# Patient Record
Sex: Female | Born: 1955 | Race: White | Hispanic: No | Marital: Single | Smoking: Never smoker
Health system: Southern US, Community
[De-identification: ages and names within clinical notes are randomized; demographics above are authoritative.]

## PROBLEM LIST (undated history)

## (undated) DIAGNOSIS — E039 Hypothyroidism, unspecified: Secondary | ICD-10-CM

## (undated) DIAGNOSIS — G43909 Migraine, unspecified, not intractable, without status migrainosus: Secondary | ICD-10-CM

## (undated) DIAGNOSIS — Z8619 Personal history of other infectious and parasitic diseases: Secondary | ICD-10-CM

## (undated) DIAGNOSIS — D649 Anemia, unspecified: Secondary | ICD-10-CM

## (undated) DIAGNOSIS — R011 Cardiac murmur, unspecified: Secondary | ICD-10-CM

## (undated) HISTORY — DX: Cardiac murmur, unspecified: R01.1

## (undated) HISTORY — DX: Personal history of other infectious and parasitic diseases: Z86.19

## (undated) HISTORY — PX: CATARACT EXTRACTION: SUR2

## (undated) HISTORY — DX: Migraine, unspecified, not intractable, without status migrainosus: G43.909

## (undated) HISTORY — PX: COLONOSCOPY: SHX174

## (undated) HISTORY — PX: OTHER SURGICAL HISTORY: SHX169

## (undated) HISTORY — PX: WISDOM TOOTH EXTRACTION: SHX21

## (undated) HISTORY — PX: ABLATION: SHX5711

---

## 1998-05-15 ENCOUNTER — Other Ambulatory Visit: Admission: RE | Admit: 1998-05-15 | Discharge: 1998-05-15 | Payer: Self-pay | Admitting: Obstetrics and Gynecology

## 1998-05-21 ENCOUNTER — Ambulatory Visit (HOSPITAL_COMMUNITY): Admission: RE | Admit: 1998-05-21 | Discharge: 1998-05-21 | Payer: Self-pay | Admitting: Obstetrics and Gynecology

## 2000-02-20 ENCOUNTER — Other Ambulatory Visit: Admission: RE | Admit: 2000-02-20 | Discharge: 2000-02-20 | Payer: Self-pay | Admitting: Obstetrics and Gynecology

## 2002-06-01 ENCOUNTER — Other Ambulatory Visit: Admission: RE | Admit: 2002-06-01 | Discharge: 2002-06-01 | Payer: Self-pay | Admitting: Obstetrics and Gynecology

## 2003-06-06 ENCOUNTER — Other Ambulatory Visit: Admission: RE | Admit: 2003-06-06 | Discharge: 2003-06-06 | Payer: Self-pay | Admitting: Obstetrics and Gynecology

## 2003-06-14 ENCOUNTER — Ambulatory Visit (HOSPITAL_COMMUNITY): Admission: RE | Admit: 2003-06-14 | Discharge: 2003-06-14 | Payer: Self-pay | Admitting: Obstetrics and Gynecology

## 2004-10-07 ENCOUNTER — Other Ambulatory Visit: Admission: RE | Admit: 2004-10-07 | Discharge: 2004-10-07 | Payer: Self-pay | Admitting: Obstetrics and Gynecology

## 2004-11-08 ENCOUNTER — Ambulatory Visit: Payer: Self-pay | Admitting: Family Medicine

## 2004-12-06 ENCOUNTER — Ambulatory Visit: Payer: Self-pay | Admitting: Internal Medicine

## 2004-12-20 ENCOUNTER — Ambulatory Visit: Payer: Self-pay | Admitting: Internal Medicine

## 2007-06-22 DIAGNOSIS — D649 Anemia, unspecified: Secondary | ICD-10-CM | POA: Insufficient documentation

## 2010-11-27 ENCOUNTER — Other Ambulatory Visit: Payer: Self-pay | Admitting: Obstetrics and Gynecology

## 2011-01-09 ENCOUNTER — Other Ambulatory Visit: Payer: Self-pay | Admitting: Obstetrics and Gynecology

## 2011-01-09 DIAGNOSIS — D580 Hereditary spherocytosis: Secondary | ICD-10-CM

## 2011-02-24 ENCOUNTER — Ambulatory Visit
Admission: RE | Admit: 2011-02-24 | Discharge: 2011-02-24 | Disposition: A | Discharge status: Home / Self Care | Payer: PRIVATE HEALTH INSURANCE | Source: Ambulatory Visit | Attending: Obstetrics and Gynecology | Admitting: Obstetrics and Gynecology

## 2011-02-24 DIAGNOSIS — D580 Hereditary spherocytosis: Secondary | ICD-10-CM

## 2011-04-29 ENCOUNTER — Encounter (HOSPITAL_COMMUNITY): Payer: Self-pay | Admitting: *Deleted

## 2011-05-08 ENCOUNTER — Encounter (HOSPITAL_COMMUNITY): Payer: Self-pay | Admitting: *Deleted

## 2011-05-08 ENCOUNTER — Encounter (HOSPITAL_COMMUNITY)
Admission: RE | Disposition: A | Discharge status: Home / Self Care | Payer: Self-pay | Source: Ambulatory Visit | Attending: Obstetrics and Gynecology

## 2011-05-08 ENCOUNTER — Ambulatory Visit (HOSPITAL_COMMUNITY): Payer: PRIVATE HEALTH INSURANCE | Admitting: Anesthesiology

## 2011-05-08 ENCOUNTER — Encounter (HOSPITAL_COMMUNITY): Payer: Self-pay | Admitting: Anesthesiology

## 2011-05-08 ENCOUNTER — Ambulatory Visit (HOSPITAL_COMMUNITY)
Admission: RE | Admit: 2011-05-08 | Discharge: 2011-05-08 | Disposition: A | Discharge status: Home / Self Care | Payer: PRIVATE HEALTH INSURANCE | Source: Ambulatory Visit | Attending: Obstetrics and Gynecology | Admitting: Obstetrics and Gynecology

## 2011-05-08 ENCOUNTER — Other Ambulatory Visit: Payer: Self-pay | Admitting: Obstetrics and Gynecology

## 2011-05-08 DIAGNOSIS — D250 Submucous leiomyoma of uterus: Secondary | ICD-10-CM | POA: Insufficient documentation

## 2011-05-08 DIAGNOSIS — N840 Polyp of corpus uteri: Secondary | ICD-10-CM | POA: Insufficient documentation

## 2011-05-08 DIAGNOSIS — N920 Excessive and frequent menstruation with regular cycle: Secondary | ICD-10-CM | POA: Insufficient documentation

## 2011-05-08 DIAGNOSIS — D649 Anemia, unspecified: Secondary | ICD-10-CM

## 2011-05-08 HISTORY — PX: HYSTEROSCOPY: SHX211

## 2011-05-08 HISTORY — DX: Anemia, unspecified: D64.9

## 2011-05-08 HISTORY — DX: Hypothyroidism, unspecified: E03.9

## 2011-05-08 LAB — PREGNANCY, URINE: Preg Test, Ur: NEGATIVE

## 2011-05-08 LAB — CBC
MCH: 28.2 pg (ref 26.0–34.0)
MCHC: 31.4 g/dL (ref 30.0–36.0)
MCV: 89.8 fL (ref 78.0–100.0)
Platelets: 317 10*3/uL (ref 150–400)

## 2011-05-08 SURGERY — ABLATION, ENDOMETRIUM, HYSTEROSCOPIC
Wound class: Clean Contaminated

## 2011-05-08 MED ORDER — ONDANSETRON HCL 4 MG/2ML IJ SOLN
INTRAMUSCULAR | Status: AC
  Filled 2011-05-08: qty 2

## 2011-05-08 MED ORDER — MEPERIDINE HCL 25 MG/ML IJ SOLN: 6.2500 mg | INTRAMUSCULAR | Status: DC | PRN

## 2011-05-08 MED ORDER — PROPOFOL 10 MG/ML IV EMUL
INTRAVENOUS | Status: AC
  Filled 2011-05-08: qty 20

## 2011-05-08 MED ORDER — FENTANYL CITRATE 0.05 MG/ML IJ SOLN
INTRAMUSCULAR | Status: DC | PRN
  Administered 2011-05-08: 100 ug via INTRAVENOUS

## 2011-05-08 MED ORDER — DEXAMETHASONE SODIUM PHOSPHATE 4 MG/ML IJ SOLN
INTRAMUSCULAR | Status: DC | PRN
  Administered 2011-05-08: 10 mg via INTRAVENOUS

## 2011-05-08 MED ORDER — FENTANYL CITRATE 0.05 MG/ML IJ SOLN
INTRAMUSCULAR | Status: AC
  Filled 2011-05-08: qty 2

## 2011-05-08 MED ORDER — LACTATED RINGERS IV SOLN
INTRAVENOUS | Status: DC
  Administered 2011-05-08: 125 mL/h via INTRAVENOUS

## 2011-05-08 MED ORDER — LIDOCAINE HCL (CARDIAC) 20 MG/ML IV SOLN
INTRAVENOUS | Status: AC
  Filled 2011-05-08: qty 5

## 2011-05-08 MED ORDER — ONDANSETRON HCL 4 MG/2ML IJ SOLN
INTRAMUSCULAR | Status: DC | PRN
  Administered 2011-05-08: 4 mg via INTRAVENOUS

## 2011-05-08 MED ORDER — FENTANYL CITRATE 0.05 MG/ML IJ SOLN: 25.0000 ug | INTRAMUSCULAR | Status: DC | PRN

## 2011-05-08 MED ORDER — CEFAZOLIN SODIUM 1-5 GM-% IV SOLN
INTRAVENOUS | Status: DC | PRN
  Administered 2011-05-08: 1 g via INTRAVENOUS

## 2011-05-08 MED ORDER — KETOROLAC TROMETHAMINE 30 MG/ML IJ SOLN
INTRAMUSCULAR | Status: AC
  Filled 2011-05-08: qty 1

## 2011-05-08 MED ORDER — LIDOCAINE HCL (CARDIAC) 20 MG/ML IV SOLN
INTRAVENOUS | Status: DC | PRN
  Administered 2011-05-08: 100 mg via INTRAVENOUS

## 2011-05-08 MED ORDER — LACTATED RINGERS IV SOLN
INTRAVENOUS | Status: DC
  Administered 2011-05-08: 1000 mL via INTRAVENOUS
  Administered 2011-05-08: 12:00:00 via INTRAVENOUS

## 2011-05-08 MED ORDER — OXYCODONE-ACETAMINOPHEN 5-325 MG PO TABS: 1.0000 | ORAL_TABLET | ORAL | Status: AC | PRN

## 2011-05-08 MED ORDER — MIDAZOLAM HCL 2 MG/2ML IJ SOLN
INTRAMUSCULAR | Status: AC
  Filled 2011-05-08: qty 2

## 2011-05-08 MED ORDER — IBUPROFEN 200 MG PO TABS: 200.0000 mg | ORAL_TABLET | Freq: Four times a day (QID) | ORAL | Status: DC | PRN

## 2011-05-08 MED ORDER — ONDANSETRON HCL 4 MG/2ML IJ SOLN: 4.0000 mg | Freq: Once | INTRAMUSCULAR | Status: DC | PRN

## 2011-05-08 MED ORDER — DEXAMETHASONE SODIUM PHOSPHATE 10 MG/ML IJ SOLN
INTRAMUSCULAR | Status: AC
  Filled 2011-05-08: qty 1

## 2011-05-08 MED ORDER — CEFAZOLIN SODIUM 1-5 GM-% IV SOLN
INTRAVENOUS | Status: AC
  Filled 2011-05-08: qty 50

## 2011-05-08 MED ORDER — MIDAZOLAM HCL 5 MG/5ML IJ SOLN
INTRAMUSCULAR | Status: DC | PRN
  Administered 2011-05-08: 2 mg via INTRAVENOUS

## 2011-05-08 MED ORDER — LIDOCAINE HCL 1 % IJ SOLN
INTRAMUSCULAR | Status: DC | PRN
  Administered 2011-05-08 (×2): 10 mL via INTRADERMAL

## 2011-05-08 SURGICAL SUPPLY — 13 items
ABLATOR ENDOMETRIAL BIPOLAR (ABLATOR) IMPLANT
CATH ROBINSON RED A/P 16FR (CATHETERS) ×2 IMPLANT
CLOTH BEACON ORANGE TIMEOUT ST (SAFETY) ×2 IMPLANT
CONTAINER PREFILL 10% NBF 60ML (FORM) IMPLANT
GLOVE ECLIPSE 7.0 STRL STRAW (GLOVE) ×4 IMPLANT
GOWN PREVENTION PLUS LG XLONG (DISPOSABLE) ×2 IMPLANT
GOWN PREVENTION PLUS XLARGE (GOWN DISPOSABLE) ×2 IMPLANT
NEEDLE SPNL 22GX3.5 QUINCKE BK (NEEDLE) ×2 IMPLANT
PACK HYSTEROSCOPY LF (CUSTOM PROCEDURE TRAY) ×2 IMPLANT
SET GENESYS HTA PROCERVA (MISCELLANEOUS) ×2 IMPLANT
SYR CONTROL 10ML LL (SYRINGE) ×2 IMPLANT
TOWEL OR 17X24 6PK STRL BLUE (TOWEL DISPOSABLE) ×4 IMPLANT
WATER STERILE IRR 1000ML POUR (IV SOLUTION) ×2 IMPLANT

## 2011-05-08 NOTE — Progress Notes (Signed)
Two vaginal sponges removed prior to discharge home pt tolerated well.

## 2011-05-08 NOTE — Discharge Instructions (Signed)
Endometrial Ablation HOME CARE INSTRUCTIONS  Do not drive for 24 hours.   No tampons, douching or intercourse for 2 weeks or until your caregiver approves.   Rest at home for 24 to 48 hours. You may then resume normal activities unless told differently by your caregiver.   Take your temperature two times a day for 4 days, and record it.   Take any medications your caregiver has ordered, as directed.   Use some form of contraception if you are pre-menopausal and do not want to get pregnant.  Bleeding after the procedure is normal. It varies from light spotting and mildly watery to bloody discharge for 4 to 6 weeks. You may also have mild cramping. Only take over-the-counter or prescription medicines for pain, discomfort, or fever as directed by your caregiver. Do not use aspirin, as this may aggravate bleeding. Frequent urination during the first 24 hours is normal. You will not know how effective your surgery is until at least 3 months after the surgery. SEEK IMMEDIATE MEDICAL CARE IF:  Bleeding is heavier than a normal menstrual cycle.   An oral temperature above 100.21F develops.   You have increasing cramps or pains not relieved with medication or develop belly (abdominal) pain which does not seem to be related to the same area of earlier cramping and pain.   You are light headed, weak or have fainting episodes.   You develop pain in the shoulder strap areas.   You have chest or leg pain.   You have abnormal vaginal discharge.   You have painful urination.  Document Released: 07/18/2004 Document Re-Released: 12/03/2009 [facility] Patient Information 2011 [place], [place].

## 2011-05-08 NOTE — H&P (Signed)
Pt is a 55 year old white female G0 who presents for ablation secondary to menorrhagia.  Pt has a normal FSH.  She has uterine fibroids. She a normal endometial biopsy. PE: VSSAF HEENT-wnl Lungs-cta Abd-soft, flat, nt, no masses Pelvic-Ext-wnl  Vagina-wnl  Cervix-nullip  Ut-8-9 weeks, no palp fibroids IMP/Menorrhagia Plan/ D& C, Ablation

## 2011-05-08 NOTE — Anesthesia Procedure Notes (Signed)
Procedure Name: LMA Insertion Date/Time: 05/08/2011 11:51 AM Performed by: Isabella Bowens Pre-anesthesia Checklist: Patient identified, Patient being monitored, Timeout performed, Emergency Drugs available and Suction available Patient Re-evaluated:Patient Re-evaluated prior to inductionOxygen Delivery Method: Circle System Utilized Preoxygenation: Pre-oxygenation with 100% oxygen Intubation Type: IV induction Ventilation: Mask ventilation without difficulty LMA: LMA inserted LMA Size: 4.0 Grade View: Grade I Number of attempts: 1

## 2011-05-08 NOTE — Transfer of Care (Signed)
Immediate Anesthesia Transfer of Care Note  Patient: Janet Brock  Procedure(s) Performed:  HYSTEROSCOPY WITH HYDROTHERMAL ABLATION  Patient Location: PACU  Anesthesia Type: General  Level of Consciousness: awake, alert  and sedated  Airway & Oxygen Therapy: Patient Spontanous Breathing and Patient connected to nasal cannula oxygen  Post-op Assessment: Report given to PACU RN and Post -op Vital signs reviewed and stable  Post vital signs: Reviewed and stable  Complications: No apparent anesthesia complications

## 2011-05-08 NOTE — Anesthesia Postprocedure Evaluation (Signed)
Anesthesia Post Note  Patient: Janet Brock  Procedure(s) Performed:  HYSTEROSCOPY WITH HYDROTHERMAL ABLATION  Anesthesia type: General  Patient location: PACU  Post pain: Pain level controlled  Post assessment: Post-op Vital signs reviewed  Last Vitals:  Filed Vitals:   05/08/11 1245  BP: 119/63  Pulse: 60  Temp:   Resp: 12    Post vital signs: Reviewed  Level of consciousness: sedated  Complications: No apparent anesthesia complicationsfj

## 2011-05-08 NOTE — Anesthesia Preprocedure Evaluation (Signed)
Anesthesia Evaluation  Name, MR# and DOB Patient awake  General Assessment Comment  Reviewed: Allergy & Precautions, H&P , NPO status , Patient's Chart, lab work & pertinent test results, reviewed documented beta blocker date and time   Airway Mallampati: I TM Distance: >3 FB Neck ROM: full    Dental No notable dental hx.    Pulmonary    pulmonary exam normalPulmonary Exam Normal     Cardiovascular regular Normal    Neuro/Psych Negative Neurological ROS  Negative Psych ROS  GI/Hepatic/Renal negative GI ROS, negative Liver ROS, and negative Renal ROS (+)       Endo/Other    Abdominal Normal abdominal exam  (+)   Musculoskeletal negative musculoskeletal ROS (+)   Hematology negative hematology ROS (+)   Peds  Reproductive/Obstetrics negative OB ROS    Anesthesia Other Findings             Anesthesia Physical Anesthesia Plan  ASA: II  Anesthesia Plan: General   Post-op Pain Management:    Induction: Intravenous  Airway Management Planned: LMA  Additional Equipment:   Intra-op Plan:   Post-operative Plan:   Informed Consent: I have reviewed the patients History and Physical, chart, labs and discussed the procedure including the risks, benefits and alternatives for the proposed anesthesia with the patient or authorized representative who has indicated his/her understanding and acceptance.   History available from chart only  Plan Discussed with: CRNA  Anesthesia Plan Comments:         Anesthesia Quick Evaluation

## 2011-05-12 NOTE — Op Note (Signed)
NAME:  Janet Brock, Janet Brock NO.:  [id number]  MEDICAL RECORD NO.:  [id number]  LOCATION:                                 FACILITY:  PHYSICIAN:  Malva Limes, M.D.    DATE OF BIRTH:  16-Jun-1956  DATE OF PROCEDURE:  05/08/2011 DATE OF DISCHARGE:                              OPERATIVE REPORT   PREOPERATIVE DIAGNOSES: 1. Menorrhagia. 2. Uterine fibroids. 3. Endometrial polyps.  POSTOPERATIVE DIAGNOSES: 1. Menorrhagia. 2. Uterine fibroids. 3. Endometrial polyps.  PROCEDURE: 1. Hysteroscopy. 2. HTA endometrial ablation. 3. Dilation and curettage.  SURGEON:  Malva Limes, MD  ANESTHESIA:  General with paracervical block.  SPECIMENS:  Endometrial curetting sent to Pathology.  COMPLICATIONS:  None.  ESTIMATED BLOOD LOSS:  Minimal.  PROCEDURE:  The patient was taken to the operating room where general anesthetic was administered without difficulty.  She was placed in dorsal lithotomy position.  She was prepped and draped in the usual fashion for this procedure.  Her bladder was drained with a rubber catheter.  Sterile speculum was placed in the vagina, 10 mL of 1% lidocaine was used for paracervical block.  The cervix was then serially dilated to a 25-French.  The hysteroscope was advanced into endometrial cavity where the patient was noted to have two polyps, one in the left cornua and the other in the anterior mid body of the uterine cavity. There was also small submucous fibroid.  At this point, the HTA device was run through its pre warm-up and seal test which passed the product rep was in the room during this procedure.  Once adequate seal was verified, device was turned on for approximately 12 minutes where good ablation appeared to be occurred.  Following this, the device was removed.  Sharp curettage was performed. Tissue sent to Pathology.  The patient was awoken and taken to recovery room in stable condition.  Instrument counts correct x2.  The  patient was discharged to home.  She was sent home with Percocet to take p.r.n. She will follow up in the office in 4 weeks.          ______________________________ Malva Limes, M.D.     MA/MEDQ  D:  05/08/2011  T:  05/08/2011  Job:  [id number]

## 2011-06-02 ENCOUNTER — Encounter (HOSPITAL_COMMUNITY): Payer: Self-pay | Admitting: Obstetrics and Gynecology

## 2014-03-23 ENCOUNTER — Other Ambulatory Visit: Payer: Self-pay | Admitting: Obstetrics and Gynecology

## 2014-03-27 LAB — CYTOLOGY - PAP

## 2014-03-31 ENCOUNTER — Other Ambulatory Visit: Payer: Self-pay | Admitting: Obstetrics and Gynecology

## 2014-03-31 DIAGNOSIS — N63 Unspecified lump in unspecified breast: Secondary | ICD-10-CM

## 2014-04-06 ENCOUNTER — Ambulatory Visit
Admission: RE | Admit: 2014-04-06 | Discharge: 2014-04-06 | Disposition: A | Discharge status: Home / Self Care | Payer: PRIVATE HEALTH INSURANCE | Source: Ambulatory Visit | Attending: Obstetrics and Gynecology | Admitting: Obstetrics and Gynecology

## 2014-04-06 DIAGNOSIS — N63 Unspecified lump in unspecified breast: Secondary | ICD-10-CM

## 2014-06-06 ENCOUNTER — Encounter: Payer: Self-pay | Admitting: Internal Medicine

## 2014-06-06 ENCOUNTER — Ambulatory Visit (INDEPENDENT_AMBULATORY_CARE_PROVIDER_SITE_OTHER): Payer: PRIVATE HEALTH INSURANCE | Admitting: Internal Medicine

## 2014-06-06 VITALS — BP 116/68 | Temp 98.3°F | Ht 65.75 in | Wt 135.6 lb

## 2014-06-06 DIAGNOSIS — L989 Disorder of the skin and subcutaneous tissue, unspecified: Secondary | ICD-10-CM | POA: Insufficient documentation

## 2014-06-06 DIAGNOSIS — E039 Hypothyroidism, unspecified: Secondary | ICD-10-CM | POA: Insufficient documentation

## 2014-06-06 DIAGNOSIS — Z23 Encounter for immunization: Secondary | ICD-10-CM

## 2014-06-06 DIAGNOSIS — E038 Other specified hypothyroidism: Secondary | ICD-10-CM

## 2014-06-06 NOTE — Patient Instructions (Signed)
Healthy lifestyle includes : At least 150 minutes of exercise weeks  , weight at healthy levels, which is usually   BMI 19-25. Avoid trans fats and processed foods;  Increase fresh fruits and veges to 5 servings per day. And avoid sweet beverages including tea and juice. Mediterranean diet with olive oil and nuts have been noted to be heart and brain healthy . Avoid tobacco products . Limit  alcohol to  7 per week for women and 14 servings for men.  Get adequate sleep . Wear seat belts . Don't text and drive .   I  think the skin area is  Seborrheic  Keratosis  Which are benign skin growth.  Plan wellness visit  Next May June  with labs .

## 2014-06-06 NOTE — Progress Notes (Signed)
Pre visit review using our clinic review tool, if applicable. No additional management support is needed unless otherwise documented below in the visit note.  Chief Complaint  Patient presents with  . Establish Care    HPI: Janet Brock is 58 y.o. single white female nonsmoking employed as a Psychologist, sport and exercise at [employer] originally from [place] but has lived in [place] and [place] who comes in today to establish for primary care physician.   She is generally quite healthy has been getting her primary care mostly at her work site who has a Designer, jewellery who can treat acute illnesses. She has been treated for her thyroid. When she was younger was felt to be overactive when she was skinny and nervous so she says. However the most recent year she has had an underactive thyroid and has been on thyroid replacement.  Her last laboratory tests were done in May and were basically normal except an A1c of 5.7.  Has lived [place] and  [place]  And back home . Since 1998.  Has other onsite care   For nurse practitioners   Acute care and labs .  Managing thyroid at this time.   Dr Ouida Sills. If her gynecologist who she sees yearly.  Health Maintenance  Topic Date Due  . Influenza Vaccine  04/23/2015  . Colonoscopy  09/23/2015  . Mammogram  04/06/2016  . Pap Smear  03/23/2017  . Tetanus/tdap  06/06/2024   Health Maintenance Review LIFESTYLE:  Exercise:  Walker   stationary bike  Tobacco/ETS:no  Alcohol: 3-4 per week  Sugar beverages:   1/2 and 1/2  Sweet tea. ocass coke some sugar in coffee Sleep: 7 hours not enough  Drug use: no no Pap mammo 7 15  Colonoscopy:  UTD.   10 year plan   Off [place] .   g3 p3. .  ROS:  GEN/ HEENT: No fever, significant weight changes sweats headaches vision problems hearing changes, CV/ PULM; No chest pain shortness of breath cough, syncope,edema  change in exercise tolerance. GI /GU: No adominal pain, vomiting, change in  bowel habits. No blood in the stool. No significant GU symptoms. SKIN/HEME: ,no acute skin rashes suspicious lesions or bleeding. No lymphadenopathy, nodules, masses. Asks about a dark skinned spot on the left shoulder that she's had for years doesn't bother her and hasn't changed. Asks if she needs to see dermatology NEURO/ PSYCH:  No neurologic signs such as weakness numbness. No depression anxiety. IMM/ Allergy: No unusual infections.  Allergy .   REST of 12 system review negative except as per HPI   Past Medical History  Diagnosis Date  . Anemia     iron supplements  . Hypothyroidism     synthroid  ? hyperthyroid when young and thin and then  dx hypo dx at clinic at work   . History of varicella   . Migraines     pre menopausal  hx of vicodin  rescue    Past Surgical History  Procedure Laterality Date  . Colonoscopy      approx 2 years ago  . Svd        x 2  . Wisdom tooth extraction    . Btl    . Hysteroscopy  05/08/2011    Procedure: HYSTEROSCOPY WITH HYDROTHERMAL ABLATION;  Surgeon: Olga Millers;  Location: [facility];  Service: Gynecology;  Laterality: N/A;  . Ablation      ? year     sister  hypothyroid  And poss niece.  Parents in [place] 91 breast cancer   Just dx healthy   father 21  Heart attack  Hx and dementia and early alzheimer  age orange . Son dx with celiac . Vs gluten intolerance   Family History  Problem Relation Age of Onset  . Cancer Mother 3    Breast Cancer  . Alcohol abuse Father   . Heart disease Father   . Dementia Father   . Lymphoma Sister   . Cancer Maternal Aunt     Breast, Skin  . Leukemia Maternal Aunt   . Thyroid disease Sister   . Breast cancer Mother     History   Social History  . Marital Status: Single    Spouse Name: N/A    Number of Children: N/A  . Years of Education: N/A   Social History Main Topics  . Smoking status: Never Smoker   . Smokeless tobacco: Never Used  . Alcohol Use: 1.2 oz/week    2 Glasses of wine  per week  . Drug Use: No  . Sexual Activity: Yes    Partners: Male    Birth Control/ Protection: Surgical   Other Topics Concern  . 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    From [place] has lived in [place] and [place].   Mom lives in [place] father lives in [place].   g3 p3    Outpatient Encounter Prescriptions as of 06/06/2014  Medication Sig  . levothyroxine (SYNTHROID, LEVOTHROID) 100 MCG tablet Take 100 mcg by mouth daily.      EXAM:  BP 116/68  Temp(Src) 98.3 F (36.8 C) (Oral)  Ht 5' 5.75" (1.67 m)  Wt 135 lb 9.6 oz (61.508 kg)  BMI 22.05 kg/m2  LMP 12/21/2013  Body mass index is 22.05 kg/(m^2).  Physical Exam: Vital signs reviewed FXT:KWIO is a well-developed well-nourished alert cooperative    who appearsr stated age in no acute distress.  HEENT: normocephalic atraumatic , Eyes: PERRL EOM's full, conjunctiva clear, Nares: paten,t no deformity discharge or tenderness., Ears: no deformity EAC's clear TMs with normal landmarks. Mouth: clear OP, no lesions, edema.  Moist mucous membranes. Dentition in adequate repair. NECK: supple without masses, thyromegaly or bruits. CHEST/PULM:  Clear to auscultation and percussion breath sounds equal no wheeze , rales or rhonchi CV: PMI is nondisplaced, S1 S2 no gallops, murmurs, rubs. Peripheral pulses are full without delay.No JVD .  ABDOMEN: Bowel sounds normal nontender  No guard or rebound, no hepato splenomegal no CVA tenderness.   Extremtities:  No clubbing cyanosis or edema, no acute joint swelling or redness no focal atrophy NEURO:  Oriented x3, cranial nerves 3-12 appear to be intact, no obvious focal weakness,gait within normal limits  SKIN: No acute rashes normal turgor,  color, no bruising or petechiae. Area left proximal strap a flat slightly irregular but up close looks like a darkened SK lesion nontender PSYCH: Oriented, good eye contact, no obvious depression anxiety, cognition and judgment appear normal. LN: no cervical adenopathy  Lab Results  Component Value Date   WBC 5.8 05/08/2011   HGB 9.4* 05/08/2011  HCT 29.9* 05/08/2011   PLT 317 05/08/2011   Labs from work site reviewed ASSESSMENT AND PLAN:  Discussed the following assessment and plan:  Other specified hypothyroidism - tsh .498 Reported as initially overactive and underactive on replacement apparently stable offered to take over care should get yearly TSH  Need for prophylactic vaccination and inoculation against influenza - Plan: Flu Vaccine QUAD 36+ mos PF IM (Fluarix Quad PF)  Need for Tdap vaccination - Plan: Tdap vaccine greater than or equal to 7yo IM  Skin lesion - Probable SK expectant management follow at this time skin protection.  Patient Care Team: Burnis Medin, MD as PCP - General (Internal Medicine) Olga Millers, MD as Consulting Physician (Obstetrics and Gynecology) Patient Instructions  Healthy lifestyle includes : At least 150 minutes of exercise weeks  , weight at healthy levels, which is usually   BMI 19-25. Avoid trans fats and processed foods;  Increase fresh fruits and veges to 5 servings per day. And avoid sweet beverages including tea and juice. Mediterranean diet with olive oil and nuts have been noted to be heart and brain healthy . Avoid tobacco products . Limit  alcohol to  7 per week for women and 14 servings for men.  Get adequate sleep . Wear seat belts . Don't text and drive .   I  think the skin area is  Seborrheic  Keratosis  Which are benign skin growth.  Plan wellness visit  Next May June  with labs .   Standley Brooking. Panosh M.D.

## 2014-07-24 ENCOUNTER — Encounter: Payer: Self-pay | Admitting: Internal Medicine

## 2015-02-05 ENCOUNTER — Other Ambulatory Visit: Payer: Self-pay | Admitting: Obstetrics and Gynecology

## 2015-02-05 DIAGNOSIS — N62 Hypertrophy of breast: Secondary | ICD-10-CM

## 2015-02-08 ENCOUNTER — Ambulatory Visit
Admission: RE | Admit: 2015-02-08 | Discharge: 2015-02-08 | Disposition: A | Discharge status: Home / Self Care | Payer: Commercial Managed Care - PPO | Source: Ambulatory Visit | Attending: Obstetrics and Gynecology | Admitting: Obstetrics and Gynecology

## 2015-02-08 DIAGNOSIS — N62 Hypertrophy of breast: Secondary | ICD-10-CM

## 2015-05-03 ENCOUNTER — Other Ambulatory Visit: Payer: Self-pay | Admitting: Obstetrics and Gynecology

## 2015-05-04 LAB — CYTOLOGY - PAP

## 2016-07-14 ENCOUNTER — Other Ambulatory Visit: Payer: Self-pay | Admitting: Obstetrics and Gynecology

## 2016-07-15 LAB — CYTOLOGY - PAP

## 2016-07-17 ENCOUNTER — Other Ambulatory Visit: Payer: Self-pay | Admitting: Obstetrics and Gynecology

## 2016-07-17 DIAGNOSIS — E2839 Other primary ovarian failure: Secondary | ICD-10-CM

## 2016-07-22 NOTE — Progress Notes (Signed)
Chief Complaint  Patient presents with  . Annual Exam    HPI: Patient  Janet Brock  60 y.o. comes in today for Preventive Health Care visit  Generally well see ob gyne    Had labs done at work in May  Sent for these told normal .  ua nl at [employer] office .   Hx recent mid chest discomfort  With motion  And not assoc with exercise  Or sob cough diaphoresis   most from cpw per  Gyne.   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 COLONOSCOPY  09/23/2015  . Hepatitis C Screening  07/22/2017 (Originally 1955-11-30)  . HIV Screening  07/22/2017 (Originally 11/25/1970)  . MAMMOGRAM  07/14/2018  . PAP SMEAR  07/15/2019  . TETANUS/TDAP  06/06/2024  . INFLUENZA VACCINE  Completed  . ZOSTAVAX  Addressed   Health Maintenance Review LIFESTYLE:  Exercise:  Not a routine but  Active  Steps.  Walks  Tobacco/ETS: no Alcohol:  One in a while  Sugar beverages: not rare  Sleep:  Not enough get 6 hours  Drug use: no HH of  4  Pets cats and dog  Work:   "Way too much " 9.5 per day .    ROS:  See hpi  GEN/ HEENT: No fever, significant weight changes sweats headaches vision problems hearing changes, CV/ PULM; No  shortness of breath cough, syncope,edema  change in exercise tolerance. GI /GU: No adominal pain, vomiting, change in bowel habits. No blood in the stool. No significant GU symptoms. SKIN/HEME: ,no acute skin rashes suspicious lesions or bleeding. No lymphadenopathy, nodules, masses.  NEURO/ PSYCH:  No neurologic signs such as weakness numbness. No depression anxiety. IMM/ Allergy: No unusual infections.  Allergy .   REST of 12 system review negative except as per HPI   Past Medical History:  Diagnosis Date  . Anemia    iron supplements  . History of varicella   . Hypothyroidism    synthroid  ? hyperthyroid when young and thin and then  dx hypo dx at clinic at work   . Migraines    pre menopausal  hx of vicodin   rescue     Past Surgical History:  Procedure Laterality Date  . ABLATION     ? year   . btl    . COLONOSCOPY     approx 2 years ago  . HYSTEROSCOPY  05/08/2011   Procedure: HYSTEROSCOPY WITH HYDROTHERMAL ABLATION;  Surgeon: Olga Millers;  Location: [facility];  Service: Gynecology;  Laterality: N/A;  . svd       x 2  . WISDOM TOOTH EXTRACTION      Family History  Problem Relation Age of Onset  . Cancer Mother 50    Breast Cancer  . Alcohol abuse Father   . Heart disease Father   . Dementia Father   . Lymphoma Sister   . Cancer Maternal Aunt     Breast, Skin  . Leukemia Maternal Aunt   . Thyroid disease Sister   . Breast cancer Mother     Social History   Social History  . Marital status: Single    Spouse name: N/A  . Number of children: N/A  . Years of education: N/A   Social History Main Topics  . Smoking status: Never Smoker  . Smokeless tobacco: Never Used  . Alcohol use 1.2 oz/week  2 Glasses of wine per week  . Drug use: No  . Sexual activity: Yes    Partners: Male    Birth control/ protection: Surgical   Other Topics Concern  . 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    From [place] has lived in [place] and [place].   Mom lives in [place] father lives in [place].   g3 p3   Father passed 2016  alzhiemer prostate cancer age 60   Mom 5 generally well    Outpatient Medications Prior to Visit  Medication Sig Dispense Refill  . levothyroxine (SYNTHROID, LEVOTHROID) 100 MCG tablet Take 100 mcg by mouth daily.       No facility-administered medications prior to visit.      EXAM:  BP 114/74 (BP Location: Right Arm, Patient Position: Sitting, Cuff Size: Normal)   Temp 98.4 F (36.9 C) (Oral)   Ht 5'  6.75" (1.695 m)   Wt 138 lb (62.6 kg)   LMP 12/21/2013   BMI 21.78 kg/m   Body mass index is 21.78 kg/m.  Physical Exam: Vital signs reviewed EZM:OQHU is a well-developed well-nourished alert cooperative    who appearsr stated age in no acute distress.  HEENT: normocephalic atraumatic , Eyes: PERRL EOM's full, conjunctiva clear, Nares: paten,t no deformity discharge or tenderness., Ears: no deformity EAC's clear TMs with normal landmarks. Mouth: clear OP, no lesions, edema.  Moist mucous membranes. Dentition in adequate repair. NECK: supple without masses, thyromegaly or bruits. CHEST/PULM:  Clear to auscultation and percussion breath sounds equal no wheeze , rales or rhonchi. No chest wall deformities or tenderness.ppionts to parasternal area  Of tenderness CV: PMI is nondisplaced, S1 S2 no gallops, murmurs, rubs. Peripheral pulses are full without delay.No JVD .  ABDOMEN: Bowel sounds normal nontender  No guard or rebound, no hepato splenomegal no CVA tenderness.  No hernia. Extremtities:  No clubbing cyanosis or edema, no acute joint swelling or redness no focal atrophy NEURO:  Oriented x3, cranial nerves 3-12 appear to be intact, no obvious focal weakness,gait within normal limits no abnormal reflexes or asymmetrical SKIN: No acute rashes normal turgor, color, no bruising or petechiae. PSYCH: Oriented, good eye contact, no obvious depression anxiety, cognition and judgment appear normal. LN: no cervical axillary inguinal adenopathy ekg sinus rhythm  / de ant forces v2-3 but could be lead  prob wnl no acute changes ASSESSMENT AND PLAN:  Discussed the following assessment and plan:  Visit for preventive health examination - Plan: EKG 12-Lead  Need for prophylactic vaccination and inoculation against influenza - Plan: Flu Vaccine QUAD 36+ mos PF IM (Fluarix & Fluzone Quad PF)  Other chest pain - prob CWP  no acute ekg  atypical fu if progressive or assoc sx   Patient Care  Team: Burnis Medin, MD as PCP - General (Internal Medicine) Olga Millers, MD as Consulting Physician (Obstetrics and Gynecology) Patient Instructions   Chest discomfort seems to be related to chest wall to pain musculoskeletal cause. You can try ibuprofen for a week 2-3 times a day but usually gets better with time if you avoid inciting factor. If you  get worsening shortness of breath associated symptoms get back with [place]. Flu vaccine today. We'll review a copy of her lab work when it does come in. If all okay checkup and see you in a year. Try to make sure you're getting enough sleep.  Health Maintenance, Female Adopting a healthy lifestyle and getting preventive care can go a long way to promote health and wellness. Talk with your health care provider about what schedule of regular examinations is right for you. This is a good chance for you to check in with your provider about disease prevention and staying healthy. In between checkups, there are plenty of things you can do on your own. Experts have done a lot of research about which lifestyle changes and preventive measures are most likely to keep you healthy. Ask your health care provider for more information. WEIGHT AND DIET  Eat a healthy diet  Be sure to include plenty of vegetables, fruits, low-fat dairy products, and lean protein.  Do not eat a lot of foods high in solid fats, added sugars, or salt.  Get regular exercise. This is one of the most important things you can do for your health.  Most adults should exercise for at least 150 minutes each week. The exercise should increase your heart rate and make you sweat (moderate-intensity exercise).  Most adults should also do strengthening exercises at least twice a week. This is in addition to the moderate-intensity exercise.  Maintain a healthy weight  Body mass index (BMI) is a measurement that can be used to identify possible weight problems. It estimates body fat based on  height and weight. Your health care provider can help determine your BMI and help you achieve or maintain a healthy weight.  For females 59 years of age and older:   A BMI below 18.5 is considered underweight.  A BMI of 18.5 to 24.9 is normal.  A BMI of 25 to 29.9 is considered overweight.  A BMI of 30 and above is considered obese.  Watch levels of cholesterol and blood lipids  You should start having your blood tested for lipids and cholesterol at 60 years of age, then have this test every 5 years.  You may need to have your cholesterol levels checked more often if:  Your lipid or cholesterol levels are high.  You are older than 60 years of age.  You are at high risk for heart disease.  CANCER SCREENING   Lung Cancer  Lung cancer screening is recommended for adults 71-2 years old who are at high risk for lung cancer because of a history of smoking.  A yearly low-dose CT scan of the lungs is recommended for people who:  Currently smoke.  Have quit within the past 15 years.  Have at least a 30-pack-year history of smoking. A pack year is smoking an average of one pack of cigarettes a day for 1 year.  Yearly screening should continue until it has been 15 years since you quit.  Yearly screening should stop if you develop a health problem that would prevent you from having lung cancer treatment.  Breast Cancer  Practice breast self-awareness. This means understanding how your breasts normally appear and feel.  It also means doing regular breast self-exams. Let your health care provider know about any changes, no matter how small.  If you are in your 20s or 30s, you should have a clinical breast exam (CBE) by a health care provider every 1-3 years as part of a  regular health exam.  If you are 40 or older, have a CBE every year. Also consider having a breast X-ray (mammogram) every year.  If you have a family history of breast cancer, talk to your health care  provider about genetic screening.  If you are at high risk for breast cancer, talk to your health care provider about having an MRI and a mammogram every year.  Breast cancer gene (BRCA) assessment is recommended for women who have family members with BRCA-related cancers. BRCA-related cancers include:  Breast.  Ovarian.  Tubal.  Peritoneal cancers.  Results of the assessment will determine the need for genetic counseling and BRCA1 and BRCA2 testing. Cervical Cancer Your health care provider may recommend that you be screened regularly for cancer of the pelvic organs (ovaries, uterus, and vagina). This screening involves a pelvic examination, including checking for microscopic changes to the surface of your cervix (Pap test). You may be encouraged to have this screening done every 3 years, beginning at age 89.  For women ages 78-65, health care providers may recommend pelvic exams and Pap testing every 3 years, or they may recommend the Pap and pelvic exam, combined with testing for human papilloma virus (HPV), every 5 years. Some types of HPV increase your risk of cervical cancer. Testing for HPV may also be done on women of any age with unclear Pap test results.  Other health care providers may not recommend any screening for nonpregnant women who are considered low risk for pelvic cancer and who do not have symptoms. Ask your health care provider if a screening pelvic exam is right for you.  If you have had past treatment for cervical cancer or a condition that could lead to cancer, you need Pap tests and screening for cancer for at least 20 years after your treatment. If Pap tests have been discontinued, your risk factors (such as having a new sexual partner) need to be reassessed to determine if screening should resume. Some women have medical problems that increase the chance of getting cervical cancer. In these cases, your health care provider may recommend more frequent screening and  Pap tests. Colorectal Cancer  This type of cancer can be detected and often prevented.  Routine colorectal cancer screening usually begins at 60 years of age and continues through 60 years of age.  Your health care provider may recommend screening at an earlier age if you have risk factors for colon cancer.  Your health care provider may also recommend using home test kits to check for hidden blood in the stool.  A small camera at the end of a tube can be used to examine your colon directly (sigmoidoscopy or colonoscopy). This is done to check for the earliest forms of colorectal cancer.  Routine screening usually begins at age 62.  Direct examination of the colon should be repeated every 5-10 years through 60 years of age. However, you may need to be screened more often if early forms of precancerous polyps or small growths are found. Skin Cancer  Check your skin from head to toe regularly.  Tell your health care provider about any new moles or changes in moles, especially if there is a change in a mole's shape or color.  Also tell your health care provider if you have a mole that is larger than the size of a pencil eraser.  Always use sunscreen. Apply sunscreen liberally and repeatedly throughout the day.  Protect yourself by wearing long sleeves, pants, a wide-brimmed hat,  and sunglasses whenever you are outside. HEART DISEASE, DIABETES, AND HIGH BLOOD PRESSURE   High blood pressure causes heart disease and increases the risk of stroke. High blood pressure is more likely to develop in:  People who have blood pressure in the high end of the normal range (130-139/85-89 mm Hg).  People who are overweight or obese.  People who are African American.  If you are 77-91 years of age, have your blood pressure checked every 3-5 years. If you are 21 years of age or older, have your blood pressure checked every year. You should have your blood pressure measured twice--once when you are at  a hospital or clinic, and once when you are not at a hospital or clinic. Record the average of the two measurements. To check your blood pressure when you are not at a hospital or clinic, you can use:  An automated blood pressure machine at a pharmacy.  A home blood pressure monitor.  If you are between 66 years and 66 years old, ask your health care provider if you should take aspirin to prevent strokes.  Have regular diabetes screenings. This involves taking a blood sample to check your fasting blood sugar level.  If you are at a normal weight and have a low risk for diabetes, have this test once every three years after 60 years of age.  If you are overweight and have a high risk for diabetes, consider being tested at a younger age or more often. PREVENTING INFECTION  Hepatitis B  If you have a higher risk for hepatitis B, you should be screened for this virus. You are considered at high risk for hepatitis B if:  You were born in a country where hepatitis B is common. Ask your health care provider which countries are considered high risk.  Your parents were born in a high-risk country, and you have not been immunized against hepatitis B (hepatitis B vaccine).  You have HIV or AIDS.  You use needles to inject street drugs.  You live with someone who has hepatitis B.  You have had sex with someone who has hepatitis B.  You get hemodialysis treatment.  You take certain medicines for conditions, including cancer, organ transplantation, and autoimmune conditions. Hepatitis C  Blood testing is recommended for:  Everyone born from [birth year] through 1965.  Anyone with known risk factors for hepatitis C. Sexually transmitted infections (STIs)  You should be screened for sexually transmitted infections (STIs) including gonorrhea and chlamydia if:  You are sexually active and are younger than 60 years of age.  You are older than 60 years of age and your health care provider tells you  that you are at risk for this type of infection.  Your sexual activity has changed since you were last screened and you are at an increased risk for chlamydia or gonorrhea. Ask your health care provider if you are at risk.  If you do not have HIV, but are at risk, it may be recommended that you take a prescription medicine daily to prevent HIV infection. This is called pre-exposure prophylaxis (PrEP). You are considered at risk if:  You are sexually active and do not regularly use condoms or know the HIV status of your partner(s).  You take drugs by injection.  You are sexually active with a partner who has HIV. Talk with your health care provider about whether you are at high risk of being infected with HIV. If you choose to begin PrEP, you should  first be tested for HIV. You should then be tested every 3 months for as long as you are taking PrEP.  PREGNANCY   If you are premenopausal and you may become pregnant, ask your health care provider about preconception counseling.  If you may become pregnant, take 400 to 800 micrograms (mcg) of folic acid every day.  If you want to prevent pregnancy, talk to your health care provider about birth control (contraception). OSTEOPOROSIS AND MENOPAUSE   Osteoporosis is a disease in which the bones lose minerals and strength with aging. This can result in serious bone fractures. Your risk for osteoporosis can be identified using a bone density scan.  If you are 49 years of age or older, or if you are at risk for osteoporosis and fractures, ask your health care provider if you should be screened.  Ask your health care provider whether you should take a calcium or vitamin D supplement to lower your risk for osteoporosis.  Menopause may have certain physical symptoms and risks.  Hormone replacement therapy may reduce some of these symptoms and risks. Talk to your health care provider about whether hormone replacement therapy is right for you.  HOME  CARE INSTRUCTIONS   Schedule regular health, dental, and eye exams.  Stay current with your immunizations.   Do not use any tobacco products including cigarettes, chewing tobacco, or electronic cigarettes.  If you are pregnant, do not drink alcohol.  If you are breastfeeding, limit how much and how often you drink alcohol.  Limit alcohol intake to no more than 1 drink per day for nonpregnant women. One drink equals 12 ounces of beer, 5 ounces of wine, or 1 ounces of hard liquor.  Do not use street drugs.  Do not share needles.  Ask your health care provider for help if you need support or information about quitting drugs.  Tell your health care provider if you often feel depressed.  Tell your health care provider if you have ever been abused or do not feel safe at home.   This information is not intended to replace advice given to you by your health care provider. Make sure you discuss any questions you have with your health care provider.   Document Released: 03/24/2011 Document Revised: 09/29/2014 Document Reviewed: 08/10/2013 Elsevier Interactive Patient Education 2016 Perry K. Weiland Tomich M.D.

## 2016-07-23 ENCOUNTER — Ambulatory Visit (INDEPENDENT_AMBULATORY_CARE_PROVIDER_SITE_OTHER): Payer: 59 | Admitting: Internal Medicine

## 2016-07-23 ENCOUNTER — Encounter: Payer: Self-pay | Admitting: Internal Medicine

## 2016-07-23 VITALS — BP 114/74 | Temp 98.4°F | Ht 66.75 in | Wt 138.0 lb

## 2016-07-23 DIAGNOSIS — Z0000 Encounter for general adult medical examination without abnormal findings: Secondary | ICD-10-CM

## 2016-07-23 DIAGNOSIS — Z23 Encounter for immunization: Secondary | ICD-10-CM

## 2016-07-23 DIAGNOSIS — R0789 Other chest pain: Secondary | ICD-10-CM | POA: Diagnosis not present

## 2016-07-23 NOTE — Patient Instructions (Addendum)
Chest discomfort seems to be related to chest wall to pain musculoskeletal cause. You can try ibuprofen for a week 2-3 times a day but usually gets better with time if you avoid inciting factor. If you get worsening shortness of breath associated symptoms get back with [place]. Flu vaccine today. We'll review a copy of her lab work when it does come in. If all okay checkup and see you in a year. Try to make sure you're getting enough sleep.  Health Maintenance, Female Adopting a healthy lifestyle and getting preventive care can go a long way to promote health and wellness. Talk with your health care provider about what schedule of regular examinations is right for you. This is a good chance for you to check in with your provider about disease prevention and staying healthy. In between checkups, there are plenty of things you can do on your own. Experts have done a lot of research about which lifestyle changes and preventive measures are most likely to keep you healthy. Ask your health care provider for more information. WEIGHT AND DIET  Eat a healthy diet  Be sure to include plenty of vegetables, fruits, low-fat dairy products, and lean protein.  Do not eat a lot of foods high in solid fats, added sugars, or salt.  Get regular exercise. This is one of the most important things you can do for your health.  Most adults should exercise for at least 150 minutes each week. The exercise should increase your heart rate and make you sweat (moderate-intensity exercise).  Most adults should also do strengthening exercises at least twice a week. This is in addition to the moderate-intensity exercise.  Maintain a healthy weight  Body mass index (BMI) is a measurement that can be used to identify possible weight problems. It estimates body fat based on height and weight. Your health care provider can help determine your BMI and help you achieve or maintain a healthy weight.  For females 33 years of age and  older:   A BMI below 18.5 is considered underweight.  A BMI of 18.5 to 24.9 is normal.  A BMI of 25 to 29.9 is considered overweight.  A BMI of 30 and above is considered obese.  Watch levels of cholesterol and blood lipids  You should start having your blood tested for lipids and cholesterol at 60 years of age, then have this test every 5 years.  You may need to have your cholesterol levels checked more often if:  Your lipid or cholesterol levels are high.  You are older than 60 years of age.  You are at high risk for heart disease.  CANCER SCREENING   Lung Cancer  Lung cancer screening is recommended for adults 8-39 years old who are at high risk for lung cancer because of a history of smoking.  A yearly low-dose CT scan of the lungs is recommended for people who:  Currently smoke.  Have quit within the past 15 years.  Have at least a 30-pack-year history of smoking. A pack year is smoking an average of one pack of cigarettes a day for 1 year.  Yearly screening should continue until it has been 15 years since you quit.  Yearly screening should stop if you develop a health problem that would prevent you from having lung cancer treatment.  Breast Cancer  Practice breast self-awareness. This means understanding how your breasts normally appear and feel.  It also means doing regular breast self-exams. Let your health care provider  know about any changes, no matter how small.  If you are in your 20s or 30s, you should have a clinical breast exam (CBE) by a health care provider every 1-3 years as part of a regular health exam.  If you are 63 or older, have a CBE every year. Also consider having a breast X-ray (mammogram) every year.  If you have a family history of breast cancer, talk to your health care provider about genetic screening.  If you are at high risk for breast cancer, talk to your health care provider about having an MRI and a mammogram every  year.  Breast cancer gene (BRCA) assessment is recommended for women who have family members with BRCA-related cancers. BRCA-related cancers include:  Breast.  Ovarian.  Tubal.  Peritoneal cancers.  Results of the assessment will determine the need for genetic counseling and BRCA1 and BRCA2 testing. Cervical Cancer Your health care provider may recommend that you be screened regularly for cancer of the pelvic organs (ovaries, uterus, and vagina). This screening involves a pelvic examination, including checking for microscopic changes to the surface of your cervix (Pap test). You may be encouraged to have this screening done every 3 years, beginning at age 70.  For women ages 30-65, health care providers may recommend pelvic exams and Pap testing every 3 years, or they may recommend the Pap and pelvic exam, combined with testing for human papilloma virus (HPV), every 5 years. Some types of HPV increase your risk of cervical cancer. Testing for HPV may also be done on women of any age with unclear Pap test results.  Other health care providers may not recommend any screening for nonpregnant women who are considered low risk for pelvic cancer and who do not have symptoms. Ask your health care provider if a screening pelvic exam is right for you.  If you have had past treatment for cervical cancer or a condition that could lead to cancer, you need Pap tests and screening for cancer for at least 20 years after your treatment. If Pap tests have been discontinued, your risk factors (such as having a new sexual partner) need to be reassessed to determine if screening should resume. Some women have medical problems that increase the chance of getting cervical cancer. In these cases, your health care provider may recommend more frequent screening and Pap tests. Colorectal Cancer  This type of cancer can be detected and often prevented.  Routine colorectal cancer screening usually begins at 60 years of  age and continues through 60 years of age.  Your health care provider may recommend screening at an earlier age if you have risk factors for colon cancer.  Your health care provider may also recommend using home test kits to check for hidden blood in the stool.  A small camera at the end of a tube can be used to examine your colon directly (sigmoidoscopy or colonoscopy). This is done to check for the earliest forms of colorectal cancer.  Routine screening usually begins at age 25.  Direct examination of the colon should be repeated every 5-10 years through 60 years of age. However, you may need to be screened more often if early forms of precancerous polyps or small growths are found. Skin Cancer  Check your skin from head to toe regularly.  Tell your health care provider about any new moles or changes in moles, especially if there is a change in a mole's shape or color.  Also tell your health care provider if  you have a mole that is larger than the size of a pencil eraser.  Always use sunscreen. Apply sunscreen liberally and repeatedly throughout the day.  Protect yourself by wearing long sleeves, pants, a wide-brimmed hat, and sunglasses whenever you are outside. HEART DISEASE, DIABETES, AND HIGH BLOOD PRESSURE   High blood pressure causes heart disease and increases the risk of stroke. High blood pressure is more likely to develop in:  People who have blood pressure in the high end of the normal range (130-139/85-89 mm Hg).  People who are overweight or obese.  People who are African American.  If you are 65-37 years of age, have your blood pressure checked every 3-5 years. If you are 25 years of age or older, have your blood pressure checked every year. You should have your blood pressure measured twice--once when you are at a hospital or clinic, and once when you are not at a hospital or clinic. Record the average of the two measurements. To check your blood pressure when you are  not at a hospital or clinic, you can use:  An automated blood pressure machine at a pharmacy.  A home blood pressure monitor.  If you are between 7 years and 41 years old, ask your health care provider if you should take aspirin to prevent strokes.  Have regular diabetes screenings. This involves taking a blood sample to check your fasting blood sugar level.  If you are at a normal weight and have a low risk for diabetes, have this test once every three years after 60 years of age.  If you are overweight and have a high risk for diabetes, consider being tested at a younger age or more often. PREVENTING INFECTION  Hepatitis B  If you have a higher risk for hepatitis B, you should be screened for this virus. You are considered at high risk for hepatitis B if:  You were born in a country where hepatitis B is common. Ask your health care provider which countries are considered high risk.  Your parents were born in a high-risk country, and you have not been immunized against hepatitis B (hepatitis B vaccine).  You have HIV or AIDS.  You use needles to inject street drugs.  You live with someone who has hepatitis B.  You have had sex with someone who has hepatitis B.  You get hemodialysis treatment.  You take certain medicines for conditions, including cancer, organ transplantation, and autoimmune conditions. Hepatitis C  Blood testing is recommended for:  Everyone born from [birth year] through 1965.  Anyone with known risk factors for hepatitis C. Sexually transmitted infections (STIs)  You should be screened for sexually transmitted infections (STIs) including gonorrhea and chlamydia if:  You are sexually active and are younger than 60 years of age.  You are older than 60 years of age and your health care provider tells you that you are at risk for this type of infection.  Your sexual activity has changed since you were last screened and you are at an increased risk for  chlamydia or gonorrhea. Ask your health care provider if you are at risk.  If you do not have HIV, but are at risk, it may be recommended that you take a prescription medicine daily to prevent HIV infection. This is called pre-exposure prophylaxis (PrEP). You are considered at risk if:  You are sexually active and do not regularly use condoms or know the HIV status of your partner(s).  You take drugs by injection.  You are sexually active with a partner who has HIV. Talk with your health care provider about whether you are at high risk of being infected with HIV. If you choose to begin PrEP, you should first be tested for HIV. You should then be tested every 3 months for as long as you are taking PrEP.  PREGNANCY   If you are premenopausal and you may become pregnant, ask your health care provider about preconception counseling.  If you may become pregnant, take 400 to 800 micrograms (mcg) of folic acid every day.  If you want to prevent pregnancy, talk to your health care provider about birth control (contraception). OSTEOPOROSIS AND MENOPAUSE   Osteoporosis is a disease in which the bones lose minerals and strength with aging. This can result in serious bone fractures. Your risk for osteoporosis can be identified using a bone density scan.  If you are 82 years of age or older, or if you are at risk for osteoporosis and fractures, ask your health care provider if you should be screened.  Ask your health care provider whether you should take a calcium or vitamin D supplement to lower your risk for osteoporosis.  Menopause may have certain physical symptoms and risks.  Hormone replacement therapy may reduce some of these symptoms and risks. Talk to your health care provider about whether hormone replacement therapy is right for you.  HOME CARE INSTRUCTIONS   Schedule regular health, dental, and eye exams.  Stay current with your immunizations.   Do not use any tobacco products  including cigarettes, chewing tobacco, or electronic cigarettes.  If you are pregnant, do not drink alcohol.  If you are breastfeeding, limit how much and how often you drink alcohol.  Limit alcohol intake to no more than 1 drink per day for nonpregnant women. One drink equals 12 ounces of beer, 5 ounces of wine, or 1 ounces of hard liquor.  Do not use street drugs.  Do not share needles.  Ask your health care provider for help if you need support or information about quitting drugs.  Tell your health care provider if you often feel depressed.  Tell your health care provider if you have ever been abused or do not feel safe at home.   This information is not intended to replace advice given to you by your health care provider. Make sure you discuss any questions you have with your health care provider.   Document Released: 03/24/2011 Document Revised: 09/29/2014 Document Reviewed: 08/10/2013 Elsevier Interactive Patient Education [employer].

## 2016-07-28 ENCOUNTER — Ambulatory Visit
Admission: RE | Admit: 2016-07-28 | Discharge: 2016-07-28 | Disposition: A | Discharge status: Home / Self Care | Payer: 59 | Source: Ambulatory Visit | Attending: Obstetrics and Gynecology | Admitting: Obstetrics and Gynecology

## 2016-07-28 DIAGNOSIS — E2839 Other primary ovarian failure: Secondary | ICD-10-CM

## 2016-10-24 ENCOUNTER — Telehealth: Payer: Self-pay | Admitting: Internal Medicine

## 2016-10-24 MED ORDER — LEVOTHYROXINE SODIUM 100 MCG PO TABS: 100.0000 ug | ORAL_TABLET | Freq: Every day | ORAL | 0 refills | Status: DC

## 2016-10-24 NOTE — Telephone Encounter (Signed)
Pt need new Rx for levothyroxine   Pharm:  [facility] [address]  Pt has had lab work done from another facility and has the results on her phone and is standing in the lobby now.

## 2016-10-24 NOTE — Telephone Encounter (Signed)
Filled for 90 days.  Pt will need to come in for TSH in May 2018.  Pt needs to be notified.

## 2016-10-27 NOTE — Telephone Encounter (Signed)
Left a message for a return call on home/cell. 

## 2016-10-28 NOTE — Telephone Encounter (Signed)
Left a message for a return call.

## 2016-11-03 ENCOUNTER — Other Ambulatory Visit: Payer: Self-pay | Admitting: Family Medicine

## 2016-11-03 DIAGNOSIS — E038 Other specified hypothyroidism: Secondary | ICD-10-CM

## 2016-11-03 NOTE — Telephone Encounter (Signed)
Tried to reach the pt by telephone multiple times.  Left a message on home/cell.  Will now close the note.

## 2016-11-07 NOTE — Progress Notes (Signed)
Chief Complaint  Patient presents with  . Acute Visit    Right leg pain    HPI: Janet Brock 61 y.o.  New problem visit Insidious onset over about 2 weeks of right leg pain lateral sometimes go down to the right lateral foot and anterior leg without a specific injury. She sometimes has low back pain. No weakness or falling or injuring but most recently she works in [employer] and has been carrying and pushing very heavy objects but doesn't remember an injury. No fever urinary tract symptoms. Occasional groin pain the worst time she notices when she is trying to get out of car which is difficult for her when she stands up and then tries to take a step. She is used ibuprofen 3 200 mg up to 3 times a day with some minimal success. ROS: See pertinent positives and negatives per HPI. No chest pain shortness of breath syncope numbness or weakness in her extremities.  Past Medical History:  Diagnosis Date  . Anemia    iron supplements  . History of varicella   . Hypothyroidism    synthroid  ? hyperthyroid when young and thin and then  dx hypo dx at clinic at work   . Migraines    pre menopausal  hx of vicodin  rescue     Family History  Problem Relation Age of Onset  . Cancer Mother 6    Breast Cancer  . Alcohol abuse Father   . Heart disease Father   . Dementia Father   . Lymphoma Sister   . Cancer Maternal Aunt     Breast, Skin  . Leukemia Maternal Aunt   . Thyroid disease Sister   . Breast cancer Mother     Social History   Social History  . Marital status: Single    Spouse name: N/A  . Number of children: N/A  . Years of education: N/A   Social History Main Topics  . Smoking status: Never Smoker  . Smokeless tobacco: Never Used  . Alcohol use 1.2 oz/week    2 Glasses of wine per week  . Drug use: No  . Sexual activity: Yes    Partners: Male    Birth control/ protection: Surgical   Other Topics Concern  . 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    From [place] has lived in [place] and [place].   Mom lives in [place] father lives in [place].   g3 p3   Father passed 2016  alzhiemer prostate cancer age 63   Mom 52 generally well    Outpatient Medications Prior to Visit  Medication Sig Dispense Refill  . aspirin EC 81 MG tablet Take 81 mg by mouth daily.    Marland Kitchen levothyroxine (SYNTHROID, LEVOTHROID) 100 MCG tablet Take 1 tablet (100 mcg total) by mouth daily. 90 tablet 0   No facility-administered medications prior to visit.      EXAM:  BP 118/68   Pulse 68   Temp 99.1 F (37.3 C)   Ht 5' 6.75" (1.695 m)   Wt 144 lb (65.3 kg)   LMP 12/21/2013  SpO2 98%   BMI 22.72 kg/m   Body mass index is 22.72 kg/m.  GENERAL: vitals reviewed and listed above, alert, oriented, appears well hydrated and in no acute distress HEENT: atraumatic, conjunctiva  clear, no obvious abnormalities on inspection of external nose and ears NECK: no obvious masses on inspection palpation  Abdomen:  Sof,t normal bowel sounds without hepatosplenomegaly, no guarding rebound or masses no CVA tenderness CV: HRRR, no clubbing cyanosis or  peripheral edema nl cap refill  MS: moves all extremities without noticeable focal  Abnormality she points to the mid LS area as an area of tenderness but negative SLR good toe heel walk and stands from sitting she catches herself on the right hip before she begins walking from pain. PSYCH: pleasant and cooperative, no obvious depression or anxiety  ASSESSMENT AND PLAN:  Discussed the following assessment and plan:  Right leg pain - suspecting radicular but   slightly atupical  naproxyn 500 bid limit life push and refer Sm  ongoing for weeks  - Plan: Ambulatory referral  to Sports Medicine  -Patient advised to return or notify health care team  if symptoms worsen ,persist or new concerns arise.  Patient Instructions  This leg pain can be related to your back. At this time limit lifting and pushing heavy weight at work Avoid prolong sitting. Stop aspirin and the ibuprofen and take description anti-inflammatory twice a day for 2 weeks Plan referral to sports medicine for further physical intervention and prevention management. If you get fever or weakness contact health team immediately.    Standley Brooking. Amiley Shishido M.D.

## 2016-11-10 ENCOUNTER — Ambulatory Visit (INDEPENDENT_AMBULATORY_CARE_PROVIDER_SITE_OTHER): Payer: 59 | Admitting: Internal Medicine

## 2016-11-10 ENCOUNTER — Encounter: Payer: Self-pay | Admitting: Internal Medicine

## 2016-11-10 VITALS — BP 118/68 | HR 68 | Temp 99.1°F | Ht 66.75 in | Wt 144.0 lb

## 2016-11-10 DIAGNOSIS — M79604 Pain in right leg: Secondary | ICD-10-CM | POA: Diagnosis not present

## 2016-11-10 MED ORDER — NAPROXEN 500 MG PO TABS: 500.0000 mg | ORAL_TABLET | Freq: Two times a day (BID) | ORAL | 1 refills | Status: DC

## 2016-11-10 NOTE — Patient Instructions (Signed)
This leg pain can be related to your back. At this time limit lifting and pushing heavy weight at work Avoid prolong sitting. Stop aspirin and the ibuprofen and take description anti-inflammatory twice a day for 2 weeks Plan referral to sports medicine for further physical intervention and prevention management. If you get fever or weakness contact health team immediately.

## 2016-11-10 NOTE — Progress Notes (Signed)
Pre visit review using our clinic review tool, if applicable. No additional management support is needed unless otherwise documented below in the visit note. 

## 2016-11-14 ENCOUNTER — Ambulatory Visit (INDEPENDENT_AMBULATORY_CARE_PROVIDER_SITE_OTHER): Payer: 59 | Admitting: Family Medicine

## 2016-11-14 ENCOUNTER — Encounter: Payer: Self-pay | Admitting: Family Medicine

## 2016-11-14 DIAGNOSIS — M7061 Trochanteric bursitis, right hip: Secondary | ICD-10-CM | POA: Insufficient documentation

## 2016-11-14 NOTE — Patient Instructions (Signed)
I think you're lateral hip and leg pain is due to inflammation of the bursa sac located over the greater trochanter of your hip/leg. It's unclear what you did to aggravate this. My guess is  something very simple.   Continue the Naprosyn twice daily with food. Your PCP gave you a refill on that so you should have enough to do 2 more weeks. If not, please call the office and I'll call some in.   After you do another 2 weeks of the Naprosyn stop it, and see how the hip/leg does. If pain returns, please schedule to see me back.  If you have new or worsening symptoms in the interim, please free to call us and we'll see you back. Call with any questions. I think this will resolve on its own. Very nice to meet  you!

## 2016-11-14 NOTE — Progress Notes (Signed)
  Janet Brock - 61 y.o. female MRN [id number]  Date of birth: 07-Jan-1956    SUBJECTIVE:      Chief Complaint:/ HPI:   3-4 weeks of right lateral upper lthigh pain. No specific inciting event, just woke up one day with it. Pain is aching in nature. Once or twice has radiated dorn into area below knee but not lower. Worse with standing from seated position or climbing stairs.Is active, rides stationary bike. 2 weeks ago she used elliptical machine for a few nights. Not sure if that was before or after pain started, buyt she thinks before. Home elliptical machine not gym; forward motion, no lateral. 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   ROS:     Pertinent review of systems: negative for fever or unusual weight change. No numbness in RLE,no swelling in RLE. No rash.  PERTINENT  PMH / PSH FH / / SH:  Past Medical, Surgical, Social, and Family History Reviewed & Updated in the EMR.  Pertinent findings include:  No personal hx Dm No FH RA Nonsmoker No hx hip or leg or knee surgeies  OBJECTIVE: BP 123/79   Pulse 67   Ht 5' 6.75" (1.695 m)   Wt 144 lb (65.3 kg)   LMP 12/21/2013   BMI 22.72 kg/m   Physical Exam:  Vital signs are reviewed. WD WN NAD GAIT normal. BUTTOCK muscles symmetrical strength including gluteus medius 5/5 bilaterally. HIPS IR/ER full and painless. FABER on right increases her pain and it is located over greater trochanteric area. There is some mild TTP over this ara if palpation is deep.  [place]: right hip/ No effusion noted of hip joint. I see a very small fluid filled bursa over greater trochanter area (< 1 cm in widest area). There is no increased doppler activity noted on color flow doppler inthis area. All muscles appear normal.  ASSESSMENT & PLAN:  See problem based charting & AVS for pt instructions.

## 2016-12-15 ENCOUNTER — Other Ambulatory Visit: Payer: Self-pay | Admitting: Internal Medicine

## 2017-02-03 ENCOUNTER — Other Ambulatory Visit: Payer: 59

## 2017-02-14 ENCOUNTER — Other Ambulatory Visit: Payer: Self-pay | Admitting: Internal Medicine

## 2017-12-11 ENCOUNTER — Other Ambulatory Visit: Payer: Self-pay | Admitting: Obstetrics and Gynecology

## 2017-12-11 DIAGNOSIS — R928 Other abnormal and inconclusive findings on diagnostic imaging of breast: Secondary | ICD-10-CM

## 2017-12-15 ENCOUNTER — Ambulatory Visit
Admission: RE | Admit: 2017-12-15 | Discharge: 2017-12-15 | Disposition: A | Discharge status: Home / Self Care | Payer: 59 | Source: Ambulatory Visit | Attending: Obstetrics and Gynecology | Admitting: Obstetrics and Gynecology

## 2017-12-15 ENCOUNTER — Other Ambulatory Visit: Payer: Self-pay | Admitting: Obstetrics and Gynecology

## 2017-12-15 DIAGNOSIS — R928 Other abnormal and inconclusive findings on diagnostic imaging of breast: Secondary | ICD-10-CM

## 2018-04-08 ENCOUNTER — Telehealth: Payer: Self-pay

## 2018-04-08 NOTE — Telephone Encounter (Signed)
Copied from [place] [id number]. Topic: Appointment Scheduling - Scheduling Inquiry for Clinic >> Apr 08, 2018 10:03 AM Mylinda Latina, NT wrote: Reason for CRM: patient called and states she would like a full blood panel . She states that her thyroid medication is almost due to be refilled and she would like to know her levels. Please call to schedule this lab appt CB# [phone]

## 2018-04-26 ENCOUNTER — Other Ambulatory Visit: Payer: Self-pay

## 2018-04-26 NOTE — Telephone Encounter (Signed)
Pt came in to office to ask about previous message. She was asking about getting labs to check her thyroid levels so she could get refills on her medication. Per chart we have not prescribed this medication since 01/2017, per pharmacy on file pt has not filled this medication since 10/2016. Pt has not had CPE since 07/2016. Pt advised she will need OV to discuss labs and medications. Pt schedule CPE.  Dr. Regis Bill - FYI. Thanks!

## 2018-05-18 NOTE — Progress Notes (Signed)
Chief Complaint  Patient presents with  . Annual Exam    NO concerns - needs refills on thyroid and labs    HPI: Patient  Janet Brock  62 y.o. comes in today for Preventive Health Care visit   Thyroid   No changes  No major health changes  No uti sx but asks for screening ua eval  Health Maintenance  Topic Date Due  . Hepatitis C Screening  26-Feb-1956  . COLONOSCOPY  09/23/2015  . INFLUENZA VACCINE  04/22/2018  . MAMMOGRAM  07/14/2018  . PAP SMEAR  07/15/2019  . TETANUS/TDAP  06/06/2024  . HIV Screening  Completed   Health Maintenance Review LIFESTYLE:  Exercise:   Good  Active .    Tobacco/ETS: no Alcohol:  Per week 3  Sugar beverages: ocass juice  Sleep: 7  Drug use: no HH of   2    2 cats and several dogs  Work:  no Screen 4 hours.  [facility].    ROS:  GEN/ HEENT: No fever, significant weight changes sweats headaches vision problems hearing changes, CV/ PULM; No chest pain shortness of breath cough, syncope,edema  change in exercise tolerance. GI /GU: No adominal pain, vomiting, change in bowel habits. No blood in the stool. No significant GU symptoms. SKIN/HEME: ,no acute skin rashes suspicious lesions or bleeding. No lymphadenopathy, nodules, masses.  NEURO/ PSYCH:  No neurologic signs such as weakness numbness. No depression anxiety. IMM/ Allergy: No unusual infections.  Allergy .   REST of 12 system review negative except as per HPI   Past Medical History:  Diagnosis Date  . Anemia    iron supplements  . History of varicella   . Hypothyroidism    synthroid  ? hyperthyroid when young and thin and then  dx hypo dx at clinic at work   . Migraines    pre menopausal  hx of vicodin  rescue     Past Surgical History:  Procedure Laterality Date  . ABLATION     ? year   . btl    . COLONOSCOPY     approx 2 years ago  . HYSTEROSCOPY  05/08/2011   Procedure: HYSTEROSCOPY WITH HYDROTHERMAL ABLATION;  Surgeon: Olga Millers;  Location: [facility];  Service: Gynecology;  Laterality: N/A;  . svd       x 2  . WISDOM TOOTH EXTRACTION      Family History  Problem Relation Age of Onset  . Cancer Mother 21       Breast Cancer  . Breast cancer Mother   . Alcohol abuse Father   . Heart disease Father   . Dementia Father   . Lymphoma Sister   . Cancer Maternal Aunt        Breast, Skin  . Leukemia Maternal Aunt   . Thyroid disease Sister     Social History   Socioeconomic History  . Marital status: Single    Spouse name: Not on file  . Number of children: Not on file  . Years of education: Not on file  . Highest education level: Not on file  Occupational History  . Not on file  Social Needs  . Financial resource strain: Not on file  . Food insecurity:    Worry: Not on file    Inability: Not on file  . Transportation needs:    Medical: Not on file    Non-medical: Not on file  Tobacco  Use  . Smoking status: Never Smoker  . Smokeless tobacco: Never Used  Substance and Sexual Activity  . Alcohol use: Yes    Alcohol/week: 2.0 standard drinks    Types: 2 Glasses of wine per week  . Drug use: No  . Sexual activity: Yes    Partners: Male    Birth control/protection: Surgical  Lifestyle  . Physical activity:    Days per week: Not on file    Minutes per session: Not on file  . Stress: Not on file  Relationships  . Social connections:    Talks on phone: Not on file    Gets together: Not on file    Attends religious service: Not on file    Active member of club or organization: Not on file    Attends meetings of clubs or organizations: Not on file    Relationship status: Not on file  Other Topics Concern  . 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    From [place] has lived in [place] and [place].   Mom lives in [place] father lives in [place].   g3 p3   Father passed 2016  alzhiemer prostate cancer age 27   Mom 85 generally well    Outpatient Medications Prior to Visit  Medication Sig Dispense Refill  . ASPIRIN 81 PO Take 1 tablet by mouth daily.    Marland Kitchen levothyroxine (SYNTHROID, LEVOTHROID) 100 MCG tablet TAKE 1 TABLET (100 MCG TOTAL) BY MOUTH DAILY. 90 tablet 0  . Multiple Vitamin (MULTI VITAMIN DAILY PO) Multi Vitamin    . naproxen (NAPROSYN) 500 MG tablet Take 1 tablet by mouth 2 (two) times daily with a meal.     No facility-administered medications prior to visit.      EXAM:  BP 102/62 (BP Location: Right Arm, Patient Position: Sitting, Cuff Size: Normal)   Pulse 73   Temp 98.1 F (36.7 C) (Oral)   Ht 5' 5.5" (1.664 m)   Wt 143 lb 1.6 oz (64.9 kg)   LMP 12/21/2013   BMI 23.45 kg/m   Body mass index is 23.45 kg/m. Wt Readings from Last 3 Encounters:  05/19/18 143 lb 1.6 oz (64.9 kg)  11/14/16 144 lb (65.3 kg)  11/10/16 144 lb (65.3 kg)    Physical Exam: Vital signs reviewed LKG:MWNU is a well-developed well-nourished alert cooperative    who appearsr stated age in no acute distress.  HEENT: normocephalic atraumatic , Eyes: PERRL EOM's full, conjunctiva clear, Nares: paten,t no deformity discharge or tenderness., Ears: no deformity EAC's clear TMs with normal landmarks. Mouth: clear OP, no lesions, edema.  Moist mucous membranes. Dentition in adequate repair. NECK: supple without masses, thyromegaly or bruits. CHEST/PULM:  Clear to auscultation and percussion breath sounds equal no wheeze , rales or rhonchi. No chest wall deformities or tenderness. Breast: normal by inspection . No dimpling, discharge, masses, tenderness or discharge . CV: PMI is nondisplaced, S1 S2 no gallops, murmurs, rubs. Peripheral pulses are full without delay.No JVD .  ABDOMEN: Bowel sounds normal nontender  No guard or  rebound, no hepato splenomegal no CVA tenderness.  No hernia. Extremtities:  No clubbing cyanosis or edema, no acute joint swelling or redness no focal atrophy NEURO:  Oriented x3, cranial nerves 3-12 appear to be intact, no obvious focal weakness,gait within normal limits no abnormal reflexes or asymmetrical SKIN: No acute rashes normal turgor, color, no bruising or petechiae. PSYCH: Oriented, good eye contact, no obvious depression anxiety, cognition and judgment appear normal. LN: no cervical axillary inguinal adenopathy    BP Readings from Last 3 Encounters:  05/19/18 102/62  11/14/16 123/79  11/10/16 118/68    ASSESSMENT AND PLAN:  Discussed the following assessment and plan:  Visit for preventive health examination - Plan: Basic metabolic panel, CBC with Differential/Platelet, Hepatic function panel, Lipid panel, TSH, POCT Urinalysis Dipstick (Automated)  Medication management - Plan: Basic metabolic panel, CBC with Differential/Platelet, Hepatic function panel, Lipid panel, TSH, POCT Urinalysis Dipstick (Automated)  Other specified hypothyroidism - Plan: Basic metabolic panel, CBC with Differential/Platelet, Hepatic function panel, Lipid panel, TSH, POCT Urinalysis Dipstick (Automated)  Screening, lipid - Plan: Lipid panel  Need for hepatitis C screening test - Plan: Hepatitis C antibody mammo through  Gyne  No need for hiv  Testing in past  No new risk  Patient Care Team: Burnis Medin, MD as PCP - General (Internal Medicine) Olga Millers, MD as Consulting Physician (Obstetrics and Gynecology) Patient Instructions  Continue lifestyle intervention healthy eating and exercise .  Will notify you  of labs when available.  Get colon cancer screening  Ask insurance about colo guard covererage.  Call gi dept about colonoscopy screen.     Health Maintenance, Female Adopting a healthy lifestyle and getting preventive care can go a long way to promote health and  wellness. Talk with your health care provider about what schedule of regular examinations is right for you. This is a good chance for you to check in with your provider about disease prevention and staying healthy. In between checkups, there are plenty of things you can do on your own. Experts have done a lot of research about which lifestyle changes and preventive measures are most likely to keep you healthy. Ask your health care provider for more information. Weight and diet Eat a healthy diet  Be sure to include plenty of vegetables, fruits, low-fat dairy products, and lean protein.  Do not eat a lot of foods high in solid fats, added sugars, or salt.  Get regular exercise. This is one of the most important things you can do for your health. ? Most adults should exercise for at least 150 minutes each week. The exercise should increase your heart rate and make you sweat (moderate-intensity exercise). ? Most adults should also do strengthening exercises at least twice a week. This is in addition to the moderate-intensity exercise.  Maintain a healthy weight  Body mass index (BMI) is a measurement that can be used to identify possible weight problems. It estimates body fat based on height and weight. Your health care provider can help determine your BMI and help you achieve or maintain a healthy weight.  For females 2 years of age and older: ? A BMI below 18.5 is considered underweight. ? A BMI of 18.5 to 24.9 is normal. ? A BMI of 25 to 29.9 is considered overweight. ? A BMI of 30 and above is considered obese.  Watch levels of cholesterol and blood lipids  You should start having your blood tested for lipids and cholesterol at 62 years of age, then have this test every 5 years.  You  may need to have your cholesterol levels checked more often if: ? Your lipid or cholesterol levels are high. ? You are older than 62 years of age. ? You are at high risk for heart disease.  Cancer  screening Lung Cancer  Lung cancer screening is recommended for adults 3-84 years old who are at high risk for lung cancer because of a history of smoking.  A yearly low-dose CT scan of the lungs is recommended for people who: ? Currently smoke. ? Have quit within the past 15 years. ? Have at least a 30-pack-year history of smoking. A pack year is smoking an average of one pack of cigarettes a day for 1 year.  Yearly screening should continue until it has been 15 years since you quit.  Yearly screening should stop if you develop a health problem that would prevent you from having lung cancer treatment.  Breast Cancer  Practice breast self-awareness. This means understanding how your breasts normally appear and feel.  It also means doing regular breast self-exams. Let your health care provider know about any changes, no matter how small.  If you are in your 20s or 30s, you should have a clinical breast exam (CBE) by a health care provider every 1-3 years as part of a regular health exam.  If you are 93 or older, have a CBE every year. Also consider having a breast X-ray (mammogram) every year.  If you have a family history of breast cancer, talk to your health care provider about genetic screening.  If you are at high risk for breast cancer, talk to your health care provider about having an MRI and a mammogram every year.  Breast cancer gene (BRCA) assessment is recommended for women who have family members with BRCA-related cancers. BRCA-related cancers include: ? Breast. ? Ovarian. ? Tubal. ? Peritoneal cancers.  Results of the assessment will determine the need for genetic counseling and BRCA1 and BRCA2 testing.  Cervical Cancer Your health care provider may recommend that you be screened regularly for cancer of the pelvic organs (ovaries, uterus, and vagina). This screening involves a pelvic examination, including checking for microscopic changes to the surface of your cervix  (Pap test). You may be encouraged to have this screening done every 3 years, beginning at age 60.  For women ages 53-65, health care providers may recommend pelvic exams and Pap testing every 3 years, or they may recommend the Pap and pelvic exam, combined with testing for human papilloma virus (HPV), every 5 years. Some types of HPV increase your risk of cervical cancer. Testing for HPV may also be done on women of any age with unclear Pap test results.  Other health care providers may not recommend any screening for nonpregnant women who are considered low risk for pelvic cancer and who do not have symptoms. Ask your health care provider if a screening pelvic exam is right for you.  If you have had past treatment for cervical cancer or a condition that could lead to cancer, you need Pap tests and screening for cancer for at least 20 years after your treatment. If Pap tests have been discontinued, your risk factors (such as having a new sexual partner) need to be reassessed to determine if screening should resume. Some women have medical problems that increase the chance of getting cervical cancer. In these cases, your health care provider may recommend more frequent screening and Pap tests.  Colorectal Cancer  This type of cancer can be detected and  often prevented.  Routine colorectal cancer screening usually begins at 62 years of age and continues through 62 years of age.  Your health care provider may recommend screening at an earlier age if you have risk factors for colon cancer.  Your health care provider may also recommend using home test kits to check for hidden blood in the stool.  A small camera at the end of a tube can be used to examine your colon directly (sigmoidoscopy or colonoscopy). This is done to check for the earliest forms of colorectal cancer.  Routine screening usually begins at age 63.  Direct examination of the colon should be repeated every 5-10 years through 62 years  of age. However, you may need to be screened more often if early forms of precancerous polyps or small growths are found.  Skin Cancer  Check your skin from head to toe regularly.  Tell your health care provider about any new moles or changes in moles, especially if there is a change in a mole's shape or color.  Also tell your health care provider if you have a mole that is larger than the size of a pencil eraser.  Always use sunscreen. Apply sunscreen liberally and repeatedly throughout the day.  Protect yourself by wearing long sleeves, pants, a wide-brimmed hat, and sunglasses whenever you are outside.  Heart disease, diabetes, and high blood pressure  High blood pressure causes heart disease and increases the risk of stroke. High blood pressure is more likely to develop in: ? People who have blood pressure in the high end of the normal range (130-139/85-89 mm Hg). ? People who are overweight or obese. ? People who are African American.  If you are 61-70 years of age, have your blood pressure checked every 3-5 years. If you are 17 years of age or older, have your blood pressure checked every year. You should have your blood pressure measured twice-once when you are at a hospital or clinic, and once when you are not at a hospital or clinic. Record the average of the two measurements. To check your blood pressure when you are not at a hospital or clinic, you can use: ? An automated blood pressure machine at a pharmacy. ? A home blood pressure monitor.  If you are between 63 years and 16 years old, ask your health care provider if you should take aspirin to prevent strokes.  Have regular diabetes screenings. This involves taking a blood sample to check your fasting blood sugar level. ? If you are at a normal weight and have a low risk for diabetes, have this test once every three years after 62 years of age. ? If you are overweight and have a high risk for diabetes, consider being tested  at a younger age or more often. Preventing infection Hepatitis B  If you have a higher risk for hepatitis B, you should be screened for this virus. You are considered at high risk for hepatitis B if: ? You were born in a country where hepatitis B is common. Ask your health care provider which countries are considered high risk. ? Your parents were born in a high-risk country, and you have not been immunized against hepatitis B (hepatitis B vaccine). ? You have HIV or AIDS. ? You use needles to inject street drugs. ? You live with someone who has hepatitis B. ? You have had sex with someone who has hepatitis B. ? You get hemodialysis treatment. ? You take certain medicines for conditions,  including cancer, organ transplantation, and autoimmune conditions.  Hepatitis C  Blood testing is recommended for: ? Everyone born from [birth year] through 1965. ? Anyone with known risk factors for hepatitis C.  Sexually transmitted infections (STIs)  You should be screened for sexually transmitted infections (STIs) including gonorrhea and chlamydia if: ? You are sexually active and are younger than 62 years of age. ? You are older than 62 years of age and your health care provider tells you that you are at risk for this type of infection. ? Your sexual activity has changed since you were last screened and you are at an increased risk for chlamydia or gonorrhea. Ask your health care provider if you are at risk.  If you do not have HIV, but are at risk, it may be recommended that you take a prescription medicine daily to prevent HIV infection. This is called pre-exposure prophylaxis (PrEP). You are considered at risk if: ? You are sexually active and do not regularly use condoms or know the HIV status of your partner(s). ? You take drugs by injection. ? You are sexually active with a partner who has HIV.  Talk with your health care provider about whether you are at high risk of being infected with HIV. If  you choose to begin PrEP, you should first be tested for HIV. You should then be tested every 3 months for as long as you are taking PrEP. Pregnancy  If you are premenopausal and you may become pregnant, ask your health care provider about preconception counseling.  If you may become pregnant, take 400 to 800 micrograms (mcg) of folic acid every day.  If you want to prevent pregnancy, talk to your health care provider about birth control (contraception). Osteoporosis and menopause  Osteoporosis is a disease in which the bones lose minerals and strength with aging. This can result in serious bone fractures. Your risk for osteoporosis can be identified using a bone density scan.  If you are 25 years of age or older, or if you are at risk for osteoporosis and fractures, ask your health care provider if you should be screened.  Ask your health care provider whether you should take a calcium or vitamin D supplement to lower your risk for osteoporosis.  Menopause may have certain physical symptoms and risks.  Hormone replacement therapy may reduce some of these symptoms and risks. Talk to your health care provider about whether hormone replacement therapy is right for you. Follow these instructions at home:  Schedule regular health, dental, and eye exams.  Stay current with your immunizations.  Do not use any tobacco products including cigarettes, chewing tobacco, or electronic cigarettes.  If you are pregnant, do not drink alcohol.  If you are breastfeeding, limit how much and how often you drink alcohol.  Limit alcohol intake to no more than 1 drink per day for nonpregnant women. One drink equals 12 ounces of beer, 5 ounces of wine, or 1 ounces of hard liquor.  Do not use street drugs.  Do not share needles.  Ask your health care provider for help if you need support or information about quitting drugs.  Tell your health care provider if you often feel depressed.  Tell your  health care provider if you have ever been abused or do not feel safe at home. This information is not intended to replace advice given to you by your health care provider. Make sure you discuss any questions you have with your health care provider.  Document Released: 03/24/2011 Document Revised: 02/14/2016 Document Reviewed: 06/12/2015 Elsevier Interactive Patient Education  2018 [employer] protect organs, store calcium, and anchor muscles. Good health habits, such as eating nutritious foods and exercising regularly, are important for maintaining healthy bones. They can also help to prevent a condition that causes bones to lose density and become weak and brittle (osteoporosis). Why is bone mass important? Bone mass refers to the amount of bone tissue that you have. The higher your bone mass, the stronger your bones. An important step toward having healthy bones throughout life is to have strong and dense bones during childhood. A young adult who has a high bone mass is more likely to have a high bone mass later in life. Bone mass at its greatest it is called peak bone mass. A large decline in bone mass occurs in older adults. In women, it occurs about the time of menopause. During this time, it is important to practice good health habits, because if more bone is lost than what is replaced, the bones will become less healthy and more likely to break (fracture). If you find that you have a low bone mass, you may be able to prevent osteoporosis or further bone loss by changing your diet and lifestyle. How can I find out if my bone mass is low? Bone mass can be measured with an X-ray test that is called a bone mineral density (BMD) test. This test is recommended for all women who are age 36 or older. It may also be recommended for men who are age 58 or older, or for people who are more likely to develop osteoporosis due to:  Having bones that break easily.  Having a long-term  disease that weakens bones, such as kidney disease or rheumatoid arthritis.  Having menopause earlier than normal.  Taking medicine that weakens bones, such as steroids, thyroid hormones, or hormone treatment for breast cancer or prostate cancer.  Smoking.  Drinking three or more alcoholic drinks each day.  What are the nutritional recommendations for healthy bones? To have healthy bones, you need to get enough of the right minerals and vitamins. Most nutrition experts recommend getting these nutrients from the foods that you eat. Nutritional recommendations vary from person to person. Ask your health care provider what is healthy for you. Here are some general guidelines. Calcium Recommendations Calcium is the most important (essential) mineral for bone health. Most people can get enough calcium from their diet, but supplements may be recommended for people who are at risk for osteoporosis. Good sources of calcium include:  Dairy products, such as low-fat or nonfat milk, cheese, and yogurt.  Dark green leafy vegetables, such as bok choy and broccoli.  Calcium-fortified foods, such as orange juice, cereal, bread, soy beverages, and tofu products.  Nuts, such as almonds.  Follow these recommended amounts for daily calcium intake:  Children, age 76?3: 700 mg.  Children, age 30?8: 1,000 mg.  Children, age 74?13: 1,300 mg.  Teens, age 308?18: 1,300 mg.  Adults, age 52?50: 1,000 mg.  Adults, age 91?70: ? Men: 1,000 mg. ? Women: 1,200 mg.  Adults, age 69 or older: 1,200 mg.  Pregnant and breastfeeding females: ? Teens: 1,300 mg. ? Adults: 1,000 mg.  Vitamin D Recommendations Vitamin D is the most essential vitamin for bone health. It helps the body to absorb calcium. Sunlight stimulates the skin to make vitamin D, so be sure to get enough sunlight. If you live in  a cold climate or you do not get outside often, your health care provider may recommend that you take vitamin D  supplements. Good sources of vitamin D in your diet include:  Egg yolks.  Saltwater fish.  Milk and cereal fortified with vitamin D.  Follow these recommended amounts for daily vitamin D intake:  Children and teens, age 26?18: 66 international units.  Adults, age 31 or younger: 400-800 international units.  Adults, age 21 or older: 800-1,000 international units.  Other Nutrients Other nutrients for bone health include:  Phosphorus. This mineral is found in meat, poultry, dairy foods, nuts, and legumes. The recommended daily intake for adult men and adult women is 700 mg.  Magnesium. This mineral is found in seeds, nuts, dark green vegetables, and legumes. The recommended daily intake for adult men is 400?420 mg. For adult women, it is 310?320 mg.  Vitamin K. This vitamin is found in green leafy vegetables. The recommended daily intake is 120 mg for adult men and 90 mg for adult women.  What type of physical activity is best for building and maintaining healthy bones? Weight-bearing and strength-building activities are important for building and maintaining peak bone mass. Weight-bearing activities cause muscles and bones to work against gravity. Strength-building activities increases muscle strength that supports bones. Weight-bearing and muscle-building activities include:  Walking and hiking.  Jogging and running.  Dancing.  Gym exercises.  Lifting weights.  Tennis and racquetball.  Climbing stairs.  Aerobics.  Adults should get at least 30 minutes of moderate physical activity on most days. Children should get at least 60 minutes of moderate physical activity on most days. Ask your health care provide what type of exercise is best for you. Where can I find more information? For more information, check out the following websites:  [place]: [employer]se  [employer]:  http://www.niams.AnonymousEar.fr.asp  This information is not intended to replace advice given to you by your health care provider. Make sure you discuss any questions you have with your health care provider. Document Released: 11/29/2003 Document Revised: 03/28/2016 Document Reviewed: 09/13/2014 Elsevier Interactive Patient Education  2018 [place]. Semiyah Newgent M.D.

## 2018-05-18 NOTE — Patient Instructions (Addendum)
Continue lifestyle intervention healthy eating and exercise .  Will notify you  of labs when available.  Get colon cancer screening  Ask insurance about colo guard covererage.  Call gi dept about colonoscopy screen.     Health Maintenance, Female Adopting a healthy lifestyle and getting preventive care can go a long way to promote health and wellness. Talk with your health care provider about what schedule of regular examinations is right for you. This is a good chance for you to check in with your provider about disease prevention and staying healthy. In between checkups, there are plenty of things you can do on your own. Experts have done a lot of research about which lifestyle changes and preventive measures are most likely to keep you healthy. Ask your health care provider for more information. Weight and diet Eat a healthy diet  Be sure to include plenty of vegetables, fruits, low-fat dairy products, and lean protein.  Do not eat a lot of foods high in solid fats, added sugars, or salt.  Get regular exercise. This is one of the most important things you can do for your health. ? Most adults should exercise for at least 150 minutes each week. The exercise should increase your heart rate and make you sweat (moderate-intensity exercise). ? Most adults should also do strengthening exercises at least twice a week. This is in addition to the moderate-intensity exercise.  Maintain a healthy weight  Body mass index (BMI) is a measurement that can be used to identify possible weight problems. It estimates body fat based on height and weight. Your health care provider can help determine your BMI and help you achieve or maintain a healthy weight.  For females 35 years of age and older: ? A BMI below 18.5 is considered underweight. ? A BMI of 18.5 to 24.9 is normal. ? A BMI of 25 to 29.9 is considered overweight. ? A BMI of 30 and above is considered obese.  Watch levels of cholesterol and  blood lipids  You should start having your blood tested for lipids and cholesterol at 62 years of age, then have this test every 5 years.  You may need to have your cholesterol levels checked more often if: ? Your lipid or cholesterol levels are high. ? You are older than 62 years of age. ? You are at high risk for heart disease.  Cancer screening Lung Cancer  Lung cancer screening is recommended for adults 39-66 years old who are at high risk for lung cancer because of a history of smoking.  A yearly low-dose CT scan of the lungs is recommended for people who: ? Currently smoke. ? Have quit within the past 15 years. ? Have at least a 30-pack-year history of smoking. A pack year is smoking an average of one pack of cigarettes a day for 1 year.  Yearly screening should continue until it has been 15 years since you quit.  Yearly screening should stop if you develop a health problem that would prevent you from having lung cancer treatment.  Breast Cancer  Practice breast self-awareness. This means understanding how your breasts normally appear and feel.  It also means doing regular breast self-exams. Let your health care provider know about any changes, no matter how small.  If you are in your 20s or 30s, you should have a clinical breast exam (CBE) by a health care provider every 1-3 years as part of a regular health exam.  If you are 40 or older,  have a CBE every year. Also consider having a breast X-ray (mammogram) every year.  If you have a family history of breast cancer, talk to your health care provider about genetic screening.  If you are at high risk for breast cancer, talk to your health care provider about having an MRI and a mammogram every year.  Breast cancer gene (BRCA) assessment is recommended for women who have family members with BRCA-related cancers. BRCA-related cancers include: ? Breast. ? Ovarian. ? Tubal. ? Peritoneal cancers.  Results of the assessment  will determine the need for genetic counseling and BRCA1 and BRCA2 testing.  Cervical Cancer Your health care provider may recommend that you be screened regularly for cancer of the pelvic organs (ovaries, uterus, and vagina). This screening involves a pelvic examination, including checking for microscopic changes to the surface of your cervix (Pap test). You may be encouraged to have this screening done every 3 years, beginning at age 21.  For women ages 30-65, health care providers may recommend pelvic exams and Pap testing every 3 years, or they may recommend the Pap and pelvic exam, combined with testing for human papilloma virus (HPV), every 5 years. Some types of HPV increase your risk of cervical cancer. Testing for HPV may also be done on women of any age with unclear Pap test results.  Other health care providers may not recommend any screening for nonpregnant women who are considered low risk for pelvic cancer and who do not have symptoms. Ask your health care provider if a screening pelvic exam is right for you.  If you have had past treatment for cervical cancer or a condition that could lead to cancer, you need Pap tests and screening for cancer for at least 20 years after your treatment. If Pap tests have been discontinued, your risk factors (such as having a new sexual partner) need to be reassessed to determine if screening should resume. Some women have medical problems that increase the chance of getting cervical cancer. In these cases, your health care provider may recommend more frequent screening and Pap tests.  Colorectal Cancer  This type of cancer can be detected and often prevented.  Routine colorectal cancer screening usually begins at 62 years of age and continues through 62 years of age.  Your health care provider may recommend screening at an earlier age if you have risk factors for colon cancer.  Your health care provider may also recommend using home test kits to  check for hidden blood in the stool.  A small camera at the end of a tube can be used to examine your colon directly (sigmoidoscopy or colonoscopy). This is done to check for the earliest forms of colorectal cancer.  Routine screening usually begins at age 50.  Direct examination of the colon should be repeated every 5-10 years through 62 years of age. However, you may need to be screened more often if early forms of precancerous polyps or small growths are found.  Skin Cancer  Check your skin from head to toe regularly.  Tell your health care provider about any new moles or changes in moles, especially if there is a change in a mole's shape or color.  Also tell your health care provider if you have a mole that is larger than the size of a pencil eraser.  Always use sunscreen. Apply sunscreen liberally and repeatedly throughout the day.  Protect yourself by wearing long sleeves, pants, a wide-brimmed hat, and sunglasses whenever you are outside.    Heart disease, diabetes, and high blood pressure  High blood pressure causes heart disease and increases the risk of stroke. High blood pressure is more likely to develop in: ? People who have blood pressure in the high end of the normal range (130-139/85-89 mm Hg). ? People who are overweight or obese. ? People who are African American.  If you are 4-70 years of age, have your blood pressure checked every 3-5 years. If you are 38 years of age or older, have your blood pressure checked every year. You should have your blood pressure measured twice-once when you are at a hospital or clinic, and once when you are not at a hospital or clinic. Record the average of the two measurements. To check your blood pressure when you are not at a hospital or clinic, you can use: ? An automated blood pressure machine at a pharmacy. ? A home blood pressure monitor.  If you are between 34 years and 35 years old, ask your health care provider if you should  take aspirin to prevent strokes.  Have regular diabetes screenings. This involves taking a blood sample to check your fasting blood sugar level. ? If you are at a normal weight and have a low risk for diabetes, have this test once every three years after 62 years of age. ? If you are overweight and have a high risk for diabetes, consider being tested at a younger age or more often. Preventing infection Hepatitis B  If you have a higher risk for hepatitis B, you should be screened for this virus. You are considered at high risk for hepatitis B if: ? You were born in a country where hepatitis B is common. Ask your health care provider which countries are considered high risk. ? Your parents were born in a high-risk country, and you have not been immunized against hepatitis B (hepatitis B vaccine). ? You have HIV or AIDS. ? You use needles to inject street drugs. ? You live with someone who has hepatitis B. ? You have had sex with someone who has hepatitis B. ? You get hemodialysis treatment. ? You take certain medicines for conditions, including cancer, organ transplantation, and autoimmune conditions.  Hepatitis C  Blood testing is recommended for: ? Everyone born from [birth year] through 1965. ? Anyone with known risk factors for hepatitis C.  Sexually transmitted infections (STIs)  You should be screened for sexually transmitted infections (STIs) including gonorrhea and chlamydia if: ? You are sexually active and are younger than 62 years of age. ? You are older than 62 years of age and your health care provider tells you that you are at risk for this type of infection. ? Your sexual activity has changed since you were last screened and you are at an increased risk for chlamydia or gonorrhea. Ask your health care provider if you are at risk.  If you do not have HIV, but are at risk, it may be recommended that you take a prescription medicine daily to prevent HIV infection. This is called  pre-exposure prophylaxis (PrEP). You are considered at risk if: ? You are sexually active and do not regularly use condoms or know the HIV status of your partner(s). ? You take drugs by injection. ? You are sexually active with a partner who has HIV.  Talk with your health care provider about whether you are at high risk of being infected with HIV. If you choose to begin PrEP, you should first be tested for HIV.  You should then be tested every 3 months for as long as you are taking PrEP. Pregnancy  If you are premenopausal and you may become pregnant, ask your health care provider about preconception counseling.  If you may become pregnant, take 400 to 800 micrograms (mcg) of folic acid every day.  If you want to prevent pregnancy, talk to your health care provider about birth control (contraception). Osteoporosis and menopause  Osteoporosis is a disease in which the bones lose minerals and strength with aging. This can result in serious bone fractures. Your risk for osteoporosis can be identified using a bone density scan.  If you are 15 years of age or older, or if you are at risk for osteoporosis and fractures, ask your health care provider if you should be screened.  Ask your health care provider whether you should take a calcium or vitamin D supplement to lower your risk for osteoporosis.  Menopause may have certain physical symptoms and risks.  Hormone replacement therapy may reduce some of these symptoms and risks. Talk to your health care provider about whether hormone replacement therapy is right for you. Follow these instructions at home:  Schedule regular health, dental, and eye exams.  Stay current with your immunizations.  Do not use any tobacco products including cigarettes, chewing tobacco, or electronic cigarettes.  If you are pregnant, do not drink alcohol.  If you are breastfeeding, limit how much and how often you drink alcohol.  Limit alcohol intake to no more  than 1 drink per day for nonpregnant women. One drink equals 12 ounces of beer, 5 ounces of wine, or 1 ounces of hard liquor.  Do not use street drugs.  Do not share needles.  Ask your health care provider for help if you need support or information about quitting drugs.  Tell your health care provider if you often feel depressed.  Tell your health care provider if you have ever been abused or do not feel safe at home. This information is not intended to replace advice given to you by your health care provider. Make sure you discuss any questions you have with your health care provider. Document Released: 03/24/2011 Document Revised: 02/14/2016 Document Reviewed: 06/12/2015 Elsevier Interactive Patient Education  2018 [place] protect organs, store calcium, and anchor muscles. Good health habits, such as eating nutritious foods and exercising regularly, are important for maintaining healthy bones. They can also help to prevent a condition that causes bones to lose density and become weak and brittle (osteoporosis). Why is bone mass important? Bone mass refers to the amount of bone tissue that you have. The higher your bone mass, the stronger your bones. An important step toward having healthy bones throughout life is to have strong and dense bones during childhood. A young adult who has a high bone mass is more likely to have a high bone mass later in life. Bone mass at its greatest it is called peak bone mass. A large decline in bone mass occurs in older adults. In women, it occurs about the time of menopause. During this time, it is important to practice good health habits, because if more bone is lost than what is replaced, the bones will become less healthy and more likely to break (fracture). If you find that you have a low bone mass, you may be able to prevent osteoporosis or further bone loss by changing your diet and lifestyle. How can I find out if my bone  mass is low? Bone mass can be measured with an X-ray test that is called a bone mineral density (BMD) test. This test is recommended for all women who are age 394 or older. It may also be recommended for men who are age 59 or older, or for people who are more likely to develop osteoporosis due to:  Having bones that break easily.  Having a long-term disease that weakens bones, such as kidney disease or rheumatoid arthritis.  Having menopause earlier than normal.  Taking medicine that weakens bones, such as steroids, thyroid hormones, or hormone treatment for breast cancer or prostate cancer.  Smoking.  Drinking three or more alcoholic drinks each day.  What are the nutritional recommendations for healthy bones? To have healthy bones, you need to get enough of the right minerals and vitamins. Most nutrition experts recommend getting these nutrients from the foods that you eat. Nutritional recommendations vary from person to person. Ask your health care provider what is healthy for you. Here are some general guidelines. Calcium Recommendations Calcium is the most important (essential) mineral for bone health. Most people can get enough calcium from their diet, but supplements may be recommended for people who are at risk for osteoporosis. Good sources of calcium include:  Dairy products, such as low-fat or nonfat milk, cheese, and yogurt.  Dark green leafy vegetables, such as bok choy and broccoli.  Calcium-fortified foods, such as orange juice, cereal, bread, soy beverages, and tofu products.  Nuts, such as almonds.  Follow these recommended amounts for daily calcium intake:  Children, age 39?3: 700 mg.  Children, age 58?8: 1,000 mg.  Children, age 62?13: 1,300 mg.  Teens, age 582?18: 1,300 mg.  Adults, age 62?50: 1,000 mg.  Adults, age 31?70: ? Men: 1,000 mg. ? Women: 1,200 mg.  Adults, age 30 or older: 1,200 mg.  Pregnant and breastfeeding females: ? Teens: 1,300  mg. ? Adults: 1,000 mg.  Vitamin D Recommendations Vitamin D is the most essential vitamin for bone health. It helps the body to absorb calcium. Sunlight stimulates the skin to make vitamin D, so be sure to get enough sunlight. If you live in a cold climate or you do not get outside often, your health care provider may recommend that you take vitamin D supplements. Good sources of vitamin D in your diet include:  Egg yolks.  Saltwater fish.  Milk and cereal fortified with vitamin D.  Follow these recommended amounts for daily vitamin D intake:  Children and teens, age 31?18: 47 international units.  Adults, age 34 or younger: 400-800 international units.  Adults, age 59 or older: 800-1,000 international units.  Other Nutrients Other nutrients for bone health include:  Phosphorus. This mineral is found in meat, poultry, dairy foods, nuts, and legumes. The recommended daily intake for adult men and adult women is 700 mg.  Magnesium. This mineral is found in seeds, nuts, dark green vegetables, and legumes. The recommended daily intake for adult men is 400?420 mg. For adult women, it is 310?320 mg.  Vitamin K. This vitamin is found in green leafy vegetables. The recommended daily intake is 120 mg for adult men and 90 mg for adult women.  What type of physical activity is best for building and maintaining healthy bones? Weight-bearing and strength-building activities are important for building and maintaining peak bone mass. Weight-bearing activities cause muscles and bones to work against gravity. Strength-building activities increases muscle strength that supports bones. Weight-bearing and muscle-building activities include:  Walking and hiking.  Jogging and running.  Dancing.  Gym exercises.  Lifting weights.  Tennis and racquetball.  Climbing stairs.  Aerobics.  Adults should get at least 30 minutes of moderate physical activity on most days. Children should get at  least 60 minutes of moderate physical activity on most days. Ask your health care provide what type of exercise is best for you. Where can I find more information? For more information, check out the following websites:  [place]: [employer]se  [employer]: http://www.niams.AnonymousEar.fr.asp  This information is not intended to replace advice given to you by your health care provider. Make sure you discuss any questions you have with your health care provider. Document Released: 11/29/2003 Document Revised: 03/28/2016 Document Reviewed: 09/13/2014 Elsevier Interactive Patient Education  [employer].

## 2018-05-19 ENCOUNTER — Ambulatory Visit (INDEPENDENT_AMBULATORY_CARE_PROVIDER_SITE_OTHER): Payer: Self-pay | Admitting: Internal Medicine

## 2018-05-19 ENCOUNTER — Encounter: Payer: Self-pay | Admitting: Internal Medicine

## 2018-05-19 VITALS — BP 102/62 | HR 73 | Temp 98.1°F | Ht 65.5 in | Wt 143.1 lb

## 2018-05-19 DIAGNOSIS — Z0000 Encounter for general adult medical examination without abnormal findings: Secondary | ICD-10-CM

## 2018-05-19 DIAGNOSIS — Z13220 Encounter for screening for lipoid disorders: Secondary | ICD-10-CM

## 2018-05-19 DIAGNOSIS — Z1159 Encounter for screening for other viral diseases: Secondary | ICD-10-CM

## 2018-05-19 DIAGNOSIS — E038 Other specified hypothyroidism: Secondary | ICD-10-CM

## 2018-05-19 DIAGNOSIS — Z79899 Other long term (current) drug therapy: Secondary | ICD-10-CM

## 2018-05-19 DIAGNOSIS — N63 Unspecified lump in unspecified breast: Secondary | ICD-10-CM | POA: Insufficient documentation

## 2018-05-19 LAB — LIPID PANEL
CHOL/HDL RATIO: 3
CHOLESTEROL: 217 mg/dL — AB (ref 0–200)
HDL: 79.6 mg/dL (ref >39.00)
LDL CALC: 127 mg/dL — AB (ref 0–99)
NonHDL: 137.31
TRIGLYCERIDES: 52 mg/dL (ref 0.0–149.0)
VLDL: 10.4 mg/dL (ref 0.0–40.0)

## 2018-05-19 LAB — CBC WITH DIFFERENTIAL/PLATELET
Basophils Absolute: 0 10*3/uL (ref 0.0–0.1)
Basophils Relative: 0.7 % (ref 0.0–3.0)
EOS ABS: 0 10*3/uL (ref 0.0–0.7)
EOS PCT: 0.8 % (ref 0.0–5.0)
HCT: 42.9 % (ref 36.0–46.0)
Hemoglobin: 14.7 g/dL (ref 12.0–15.0)
LYMPHS ABS: 2.4 10*3/uL (ref 0.7–4.0)
Lymphocytes Relative: 40.4 % (ref 12.0–46.0)
MCHC: 34.3 g/dL (ref 30.0–36.0)
MCV: 91.7 fl (ref 78.0–100.0)
MONO ABS: 0.4 10*3/uL (ref 0.1–1.0)
Monocytes Relative: 7.7 % (ref 3.0–12.0)
NEUTROS PCT: 50.4 % (ref 43.0–77.0)
Neutro Abs: 3 10*3/uL (ref 1.4–7.7)
Platelets: 305 10*3/uL (ref 150.0–400.0)
RBC: 4.68 Mil/uL (ref 3.87–5.11)
RDW: 13.7 % (ref 11.5–15.5)
WBC: 5.8 10*3/uL (ref 4.0–10.5)

## 2018-05-19 LAB — TSH: TSH: 0.65 u[IU]/mL (ref 0.35–4.50)

## 2018-05-19 LAB — POC URINALSYSI DIPSTICK (AUTOMATED)
BILIRUBIN UA: NEGATIVE
Blood, UA: NEGATIVE
Glucose, UA: NEGATIVE
KETONES UA: NEGATIVE
Leukocytes, UA: NEGATIVE
Nitrite, UA: NEGATIVE
Protein, UA: NEGATIVE
SPEC GRAV UA: 1.015 (ref 1.010–1.025)
Urobilinogen, UA: 0.2 E.U./dL
pH, UA: 7 (ref 5.0–8.0)

## 2018-05-19 LAB — BASIC METABOLIC PANEL
BUN: 17 mg/dL (ref 6–23)
CALCIUM: 9.6 mg/dL (ref 8.4–10.5)
CO2: 30 mEq/L (ref 19–32)
CREATININE: 0.74 mg/dL (ref 0.40–1.20)
Chloride: 104 mEq/L (ref 96–112)
GFR: 84.39 mL/min (ref >60.00)
GLUCOSE: 91 mg/dL (ref 70–99)
POTASSIUM: 5 meq/L (ref 3.5–5.1)
Sodium: 141 mEq/L (ref 135–145)

## 2018-05-19 LAB — HEPATIC FUNCTION PANEL
ALT: 21 U/L (ref 0–35)
AST: 17 U/L (ref 0–37)
Albumin: 4.5 g/dL (ref 3.5–5.2)
Alkaline Phosphatase: 61 U/L (ref 39–117)
BILIRUBIN DIRECT: 0.2 mg/dL (ref 0.0–0.3)
BILIRUBIN TOTAL: 1 mg/dL (ref 0.2–1.2)
Total Protein: 7 g/dL (ref 6.0–8.3)

## 2018-05-20 LAB — HEPATITIS C ANTIBODY
HEP C AB: NONREACTIVE
SIGNAL TO CUT-OFF: 0.02 (ref <1.00)

## 2018-05-21 ENCOUNTER — Other Ambulatory Visit: Payer: Self-pay

## 2018-05-31 ENCOUNTER — Telehealth: Payer: Self-pay | Admitting: Internal Medicine

## 2018-05-31 MED ORDER — LEVOTHYROXINE SODIUM 100 MCG PO TABS: 100.0000 ug | ORAL_TABLET | Freq: Every day | ORAL | 3 refills | Status: DC

## 2018-05-31 NOTE — Telephone Encounter (Signed)
Patient is out of meds

## 2018-05-31 NOTE — Telephone Encounter (Signed)
Copied from [place] [phone]. Topic: Quick Communication - Rx Refill/Question >> May 31, 2018  2:11 PM Synthia Innocent wrote: Medication: levothyroxine (SYNTHROID, LEVOTHROID) 100 MCG tablet  Has the patient contacted their pharmacy? Yes.   (Agent: If no, request that the patient contact the pharmacy for the refill.) (Agent: If yes, when and what did the pharmacy advise?)  Preferred Pharmacy (with phone number or street name): [facility] on [address]: Please be advised that RX refills may take up to 3 business days. We ask that you follow-up with your pharmacy.  Patient requesting call once sent, was told by nurse that this was to be sent on 05/21/18.

## 2018-06-18 ENCOUNTER — Other Ambulatory Visit: Payer: No Typology Code available for payment source

## 2018-06-21 ENCOUNTER — Ambulatory Visit
Admission: RE | Admit: 2018-06-21 | Discharge: 2018-06-21 | Disposition: A | Discharge status: Home / Self Care | Payer: PRIVATE HEALTH INSURANCE | Source: Ambulatory Visit | Attending: Obstetrics and Gynecology | Admitting: Obstetrics and Gynecology

## 2018-06-21 ENCOUNTER — Other Ambulatory Visit: Payer: Self-pay | Admitting: Obstetrics and Gynecology

## 2018-06-21 DIAGNOSIS — R928 Other abnormal and inconclusive findings on diagnostic imaging of breast: Secondary | ICD-10-CM

## 2018-06-21 DIAGNOSIS — N63 Unspecified lump in unspecified breast: Secondary | ICD-10-CM

## 2018-10-29 ENCOUNTER — Encounter: Payer: Self-pay | Admitting: Internal Medicine

## 2018-10-29 ENCOUNTER — Ambulatory Visit (INDEPENDENT_AMBULATORY_CARE_PROVIDER_SITE_OTHER): Payer: PRIVATE HEALTH INSURANCE | Admitting: Internal Medicine

## 2018-10-29 VITALS — BP 110/68 | HR 100 | Temp 100.4°F | Wt 146.2 lb

## 2018-10-29 DIAGNOSIS — R8290 Unspecified abnormal findings in urine: Secondary | ICD-10-CM | POA: Diagnosis not present

## 2018-10-29 DIAGNOSIS — R112 Nausea with vomiting, unspecified: Secondary | ICD-10-CM | POA: Diagnosis not present

## 2018-10-29 DIAGNOSIS — R1013 Epigastric pain: Secondary | ICD-10-CM

## 2018-10-29 LAB — BASIC METABOLIC PANEL
BUN: 12 mg/dL (ref 7–25)
CHLORIDE: 103 mmol/L (ref 98–110)
CO2: 30 mmol/L (ref 20–32)
CREATININE: 0.69 mg/dL (ref 0.50–0.99)
Calcium: 8.9 mg/dL (ref 8.6–10.4)
Glucose, Bld: 123 mg/dL — ABNORMAL HIGH (ref 65–99)
POTASSIUM: 3.5 mmol/L (ref 3.5–5.3)
SODIUM: 141 mmol/L (ref 135–146)
Sodium: 141 (ref 137–147)

## 2018-10-29 LAB — CBC WITH DIFFERENTIAL/PLATELET
ABSOLUTE MONOCYTES: 1345 {cells}/uL — AB (ref 200–950)
Basophils Absolute: 82 cells/uL (ref 0–200)
Basophils Relative: 0.5 %
EOS PCT: 0.4 %
Eosinophils Absolute: 66 cells/uL (ref 15–500)
HEMATOCRIT: 42.2 % (ref 35.0–45.0)
HEMOGLOBIN: 14.4 g/dL (ref 11.7–15.5)
LYMPHS ABS: 2132 {cells}/uL (ref 850–3900)
MCH: 31.2 pg (ref 27.0–33.0)
MCHC: 34.1 g/dL (ref 32.0–36.0)
MCV: 91.5 fL (ref 80.0–100.0)
MPV: 8.4 fL (ref 7.5–12.5)
Monocytes Relative: 8.2 %
NEUTROS ABS: 12776 {cells}/uL — AB (ref 1500–7800)
NEUTROS PCT: 77.9 %
Platelets: 291 10*3/uL (ref 140–400)
RBC: 4.61 10*6/uL (ref 3.80–5.10)
RDW: 12.9 % (ref 11.0–15.0)
Total Lymphocyte: 13 %
WBC: 16.4 10*3/uL — ABNORMAL HIGH (ref 3.8–10.8)

## 2018-10-29 LAB — HEPATIC FUNCTION PANEL
AG RATIO: 1.7 (calc) (ref 1.0–2.5)
ALKALINE PHOSPHATASE (APISO): 65 U/L (ref 37–153)
ALT: 14 U/L (ref 6–29)
AST: 12 U/L (ref 10–35)
Albumin: 4.1 g/dL (ref 3.6–5.1)
BILIRUBIN DIRECT: 0.2 mg/dL (ref 0.0–0.2)
BILIRUBIN INDIRECT: 0.6 mg/dL (ref 0.2–1.2)
BILIRUBIN TOTAL: 0.8 mg/dL (ref 0.2–1.2)
GLOBULIN: 2.4 g/dL (ref 1.9–3.7)
TOTAL PROTEIN: 6.5 g/dL (ref 6.1–8.1)

## 2018-10-29 LAB — POC URINALSYSI DIPSTICK (AUTOMATED)
Glucose, UA: NEGATIVE
KETONES UA: NEGATIVE
Nitrite, UA: NEGATIVE
PROTEIN UA: POSITIVE — AB
UROBILINOGEN UA: 0.2 U/dL
pH, UA: 5.5 (ref 5.0–8.0)

## 2018-10-29 LAB — CBC AND DIFFERENTIAL
Neutrophils Absolute: 12776
WBC: 16.4

## 2018-10-29 MED ORDER — ONDANSETRON 4 MG PO TBDP: 4.0000 mg | ORAL_TABLET | Freq: Three times a day (TID) | ORAL | 0 refills | Status: DC | PRN

## 2018-10-29 MED ORDER — CIPROFLOXACIN HCL 500 MG PO TABS: 500.0000 mg | ORAL_TABLET | Freq: Two times a day (BID) | ORAL | 0 refills | Status: AC

## 2018-10-29 NOTE — Progress Notes (Signed)
Chief Complaint  Patient presents with  . Abdominal Pain    since monday hasnt thrown up since tuesday has diarrhea every now and then. Been eating rice, broth, applesauce and toast bland foods. pain in upper abdominal pain is 4 on scale of 1 to 10.Saw urgent care and they advised to see pcp or go to ED. Has take otc ibuprofen this week     HPI: Janet Brock 63 y.o. come in for acute onset   As above.  First  New restaurant banana pancake   In afternoon  Had gi sx   Nausea?then tues am feb 4 nausea and  Then ate rice and broth. And then vomiting later the day .  Pain in high epigastrum  On going and not going away. and then exhausted.and sig anorexia bland diet.       After eating something .  Not worse    Sleep though it. Esp last night  No fever until today  ?   Seen   At [facility] and told to get eval ed or pcp and poss [place] etc  Concern about spleen cause of luq p  Couple loose stools.  But no real diarrhea   ROS: See pertinent positives and negatives per HPI. No cp sob  Cough  Chills  Some fever    No neuro sx  Suppose to  go [place]  No hs uti   Past Medical History:  Diagnosis Date  . Anemia    iron supplements  . History of varicella   . Hypothyroidism    synthroid  ? hyperthyroid when young and thin and then  dx hypo dx at clinic at work   . Migraines    pre menopausal  hx of vicodin  rescue     Family History  Problem Relation Age of Onset  . Cancer Mother 66       Breast Cancer  . Breast cancer Mother   . Alcohol abuse Father   . Heart disease Father   . Dementia Father   . Lymphoma Sister   . Cancer Maternal Aunt        Breast, Skin  . Leukemia Maternal Aunt   . Breast cancer Maternal Aunt   . Thyroid disease Sister     Social History   Socioeconomic History  . Marital status: Single    Spouse name: Not on file  . Number of children: Not on file  . Years of education: Not on file  . Highest education level: Not on file  Occupational History  .  Not on file  Social Needs  . Financial resource strain: Not on file  . Food insecurity:    Worry: Not on file    Inability: Not on file  . Transportation needs:    Medical: Not on file    Non-medical: Not on file  Tobacco Use  . Smoking status: Never Smoker  . Smokeless tobacco: Never Used  Substance and Sexual Activity  . Alcohol use: Yes    Alcohol/week: 2.0 standard drinks    Types: 2 Glasses of wine per week  . Drug use: No  . Sexual activity: Yes    Partners: Male    Birth control/protection: Surgical  Lifestyle  . Physical activity:    Days per week: Not on file    Minutes per session: Not on file  . Stress: Not on file  Relationships  . Social connections:    Talks on  phone: Not on file    Gets together: Not on file    Attends religious service: Not on file    Active member of club or organization: Not on file    Attends meetings of clubs or organizations: Not on file    Relationship status: Not on file  Other Topics Concern  . 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    From [place] has lived in [place] and [place].   Mom lives in [place] father lives in [place].   g3 p3   Father passed 2016  alzhiemer prostate cancer age 75   Mom 49 generally well    Outpatient Medications Prior to Visit  Medication Sig Dispense Refill  . ASPIRIN 81 PO Take 1 tablet by mouth daily.    Marland Kitchen levothyroxine (SYNTHROID, LEVOTHROID) 100 MCG tablet Take 1 tablet (100 mcg total) by mouth daily. 90 tablet 3  . Multiple Vitamin (MULTI VITAMIN DAILY PO) Multi Vitamin    . naproxen (NAPROSYN) 500 MG tablet Take 1 tablet by mouth 2 (two) times daily with a meal.     No facility-administered medications prior to visit.      EXAM:  BP  110/68 (BP Location: Right Arm, Patient Position: Sitting, Cuff Size: Normal)   Pulse 100   Temp (!) 100.4 F (38 C) (Oral)   Wt 146 lb 3.2 oz (66.3 kg)   LMP 12/21/2013   BMI 23.96 kg/m   Body mass index is 23.96 kg/m.  GENERAL: vitals reviewed and listed above, alert, oriented, appears well hydrated and in no acute distress tired no ntoxic and no n  icteric  HEENT: atraumatic, conjunctiva  clear, no obvious abnormalities on inspection of external nose and ears OP : no lesion edema or exudate moist mm NECK: no obvious masses on inspection palpation  LUNGS: clear to auscultation bilaterally, no wheezes, rales or rhonchi, good air movement CV: HRRR, no clubbing cyanosis or  peripheral edema nl cap refill  Abdomen:  Sof,t normal bowel sounds without hepatosplenomegaly pos tender  High epigastrium mid to left upper with guarding no rebound  No  masses no CVA tenderness.  MS: moves all extremities without noticeable focal  abnormality PSYCH: pleasant and cooperative, no obvious depression or anxiety Lab Results  Component Value Date   WBC 5.8 05/19/2018   HGB 14.7 05/19/2018   HCT 42.9 05/19/2018   PLT 305.0 05/19/2018   GLUCOSE 91 05/19/2018   CHOL 217 (H) 05/19/2018   TRIG 52.0 05/19/2018   HDL 79.60 05/19/2018   LDLCALC 127 (H) 05/19/2018   ALT 21 05/19/2018   AST 17 05/19/2018   NA 141 05/19/2018   K 5.0 05/19/2018   CL 104 05/19/2018   CREATININE 0.74 05/19/2018   BUN 17 05/19/2018   CO2 30 05/19/2018   TSH 0.65 05/19/2018   BP Readings from Last 3 Encounters:  10/29/18 110/68  05/19/18 102/62  11/14/16 123/79  UA 3+ leuk  Sent for culture  ASSESSMENT AND PLAN:  Discussed the following assessment and plan:  Non-intractable vomiting with nausea, unspecified vomiting type - Plan: POCT Urinalysis Dipstick (Automated), CBC with Differential/Platelet, Basic  metabolic panel, Hepatic Function Panel, CANCELED: CBC with Differential/Platelet, CANCELED: Basic metabolic  panel, CANCELED: Hepatic function panel  Epigastric pain - Plan: POCT Urinalysis Dipstick (Automated), CBC with Differential/Platelet, Basic metabolic panel, Hepatic Function Panel, CANCELED: CBC with Differential/Platelet, CANCELED: Basic metabolic panel, CANCELED: Hepatic function panel  Abnormal urinalysis - poss uti  - Plan: Culture, Urine Epigastric pain  And nausea with low grade fever   loose stool sudden onset but no post prandial sx  u/a pyuria   Poss uti  Atypical presentation  It is Friday afternoon     Expectant management.   Add antibiotic for poss  uti with fever   To ED  Ir worse  Fever pain etc.  -Patient advised to return or notify health care team  if  new concerns arise.  Patient Instructions  This acts like a gastritis   But I agree should be gin getting better   And urine looks like a UTI   Nausea med push fluids  ( not to worry about  Solids  Right now )  If getting worse may need to seek ED care .  Marland Kitchen  Stay hydrtaed  Sending  blood work  Standley Brooking. Odile Veloso M.D.

## 2018-10-29 NOTE — Patient Instructions (Addendum)
This acts like a gastritis   But I agree should be gin getting better   And urine looks like a UTI   Nausea med push fluids  ( not to worry about  Solids  Right now )  If getting worse may need to seek ED care .  Marland Kitchen  Stay hydrtaed  Sending  blood work

## 2018-10-30 LAB — URINE CULTURE
MICRO NUMBER: 166810
SPECIMEN QUALITY:: ADEQUATE

## 2018-11-03 NOTE — Progress Notes (Signed)
Chief Complaint  Patient presents with  . Follow-up    Pt states she has been feeling alot better and been able to eat pt has 4 and half more days of cipro     HPI: Janet Brock 63 y.o. come in for fu   Upper abd sx vomiting and nauseas  And  fever    Felt to be uti because of pyuria on eval   Taking cipro   Feels  "12 million  Percent better ".  By the 3rd dose     At onset got very nauseaus .   At onset.   So hydrated and applesauce  Went on trip.( dog show)   Didn't take the  Antinausea med .   Concerned about potnetial side effects.   now feels fine  No abd pain but does have  Clear  Jelly dc but no itching   Lower abd pain  ( next gyne check in April?    ROS: See pertinent positives and negatives per HPI. No fever current  Chills   Eating  Fairly normal now staying hydrated trying not to overdo it . But now has an appetite   Past Medical History:  Diagnosis Date  . Anemia    iron supplements  . History of varicella   . Hypothyroidism    synthroid  ? hyperthyroid when young and thin and then  dx hypo dx at clinic at work   . Migraines    pre menopausal  hx of vicodin  rescue     Family History  Problem Relation Age of Onset  . Cancer Mother 40       Breast Cancer  . Breast cancer Mother   . Alcohol abuse Father   . Heart disease Father   . Dementia Father   . Lymphoma Sister   . Cancer Maternal Aunt        Breast, Skin  . Leukemia Maternal Aunt   . Breast cancer Maternal Aunt   . Thyroid disease Sister     Social History   Socioeconomic History  . Marital status: Single    Spouse name: Not on file  . Number of children: Not on file  . Years of education: Not on file  . Highest education level: Not on file  Occupational History  . Not on file  Social Needs  . Financial resource strain: Not on file  . Food insecurity:    Worry: Not on file    Inability: Not on file  . Transportation needs:    Medical: Not on file    Non-medical: Not on file    Tobacco Use  . Smoking status: Never Smoker  . Smokeless tobacco: Never Used  Substance and Sexual Activity  . Alcohol use: Yes    Alcohol/week: 2.0 standard drinks    Types: 2 Glasses of wine per week  . Drug use: No  . Sexual activity: Yes    Partners: Male    Birth control/protection: Surgical  Lifestyle  . Physical activity:    Days per week: Not on file    Minutes per session: Not on file  . Stress: Not on file  Relationships  . Social connections:    Talks on phone: Not on file    Gets together: Not on file    Attends religious service: Not on file    Active member of club or organization: Not on file    Attends meetings of clubs or organizations: Not on  file    Relationship status: Not on file  Other Topics Concern  . 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    From [place] has lived in [place] and [place].   Mom lives in [place] father lives in [place].   g3 p3   Father passed 2016  alzhiemer prostate cancer age 14   Mom 15 generally well    Outpatient Medications Prior to Visit  Medication Sig Dispense Refill  . ASPIRIN 81 PO Take 1 tablet by mouth daily.    . ciprofloxacin (CIPRO) 500 MG tablet Take 1 tablet (500 mg total) by mouth 2 (two) times daily for 10 days. 20 tablet 0  . levothyroxine (SYNTHROID, LEVOTHROID) 100 MCG tablet Take 1 tablet (100 mcg total) by mouth daily. 90 tablet 3  . Multiple Vitamin (MULTI VITAMIN DAILY PO) Multi Vitamin    . naproxen (NAPROSYN) 500 MG tablet Take 1 tablet by mouth 2 (two) times daily with a meal.    . ondansetron (ZOFRAN-ODT) 4 MG disintegrating tablet Take 1 tablet (4 mg total) by mouth every 8 (eight) hours as needed for nausea or vomiting. 20 tablet 0   No  facility-administered medications prior to visit.      EXAM:  BP 110/62 (BP Location: Right Arm, Patient Position: Sitting, Cuff Size: Normal)   Pulse 65   Temp 98.6 F (37 C) (Oral)   Wt 149 lb 6.4 oz (67.8 kg)   LMP 12/21/2013   SpO2 98%   BMI 24.48 kg/m   Body mass index is 24.48 kg/m.  GENERAL: vitals reviewed and listed above, alert, oriented, appears well hydrated and in no acute distress looks well  HEENT: atraumatic, conjunctiva  clear, no obvious abnormalities on inspection of external nose and ears NECK: no obvious masses on inspection palpation  LUNGS: clear to auscultation bilaterally, no wheezes, rales or rhonchi, good air movement CV: HRRR, no clubbing cyanosis or  peripheral edema nl cap refill  Abdomen:  Sof,t normal bowel sounds without hepatosplenomegaly, no guarding rebound or masses no CVA tenderness MS: moves all extremities without noticeable focal  abnormality PSYCH: pleasant and cooperative, no obvious depression or anxiety Lab Results  Component Value Date   WBC 16.4 (H) 10/29/2018   HGB 14.4 10/29/2018   HCT 42.2 10/29/2018   PLT 291 10/29/2018   GLUCOSE 123 (H) 10/29/2018   CHOL 217 (H) 05/19/2018   TRIG 52.0 05/19/2018   HDL 79.60 05/19/2018   LDLCALC 127 (H) 05/19/2018   ALT 14 10/29/2018   AST 12 10/29/2018   NA 141 10/29/2018   K 3.5 10/29/2018   CL 103 10/29/2018   CREATININE 0.69 10/29/2018   BUN 12 10/29/2018   CO2 30 10/29/2018   TSH 0.65 05/19/2018   BP Readings from Last 3 Encounters:  11/04/18 110/62  10/29/18 110/68  05/19/18 102/62   ua still with 2+ leuk  ( declined vag exam no sig sx )  ASSESSMENT AND PLAN:  Discussed the following assessment and plan:  Pyuria - Plan: POCT Urinalysis Dipstick (Automated)  Medication management - Plan: POCT Urinalysis Dipstick (Automated)  Epigastric pain resolved  Doing  much better   Plan fu after finish meds  And recheck or with relapse.  -Patient advised to return or notify  health care team  if  new concerns arise.  Patient Instructions  Glad you are doing better .  Stay hydrated .  Finish the  cipro   And then rov   Week of feb 24th .  Let us know if relapsing sx and we will reevaluate. Before  then   [employer].  M.D.

## 2018-11-04 ENCOUNTER — Ambulatory Visit (INDEPENDENT_AMBULATORY_CARE_PROVIDER_SITE_OTHER): Payer: PRIVATE HEALTH INSURANCE | Admitting: Internal Medicine

## 2018-11-04 ENCOUNTER — Encounter: Payer: Self-pay | Admitting: Internal Medicine

## 2018-11-04 VITALS — BP 110/62 | HR 65 | Temp 98.6°F | Wt 149.4 lb

## 2018-11-04 DIAGNOSIS — R8281 Pyuria: Secondary | ICD-10-CM

## 2018-11-04 DIAGNOSIS — Z79899 Other long term (current) drug therapy: Secondary | ICD-10-CM | POA: Diagnosis not present

## 2018-11-04 DIAGNOSIS — R1013 Epigastric pain: Secondary | ICD-10-CM

## 2018-11-04 LAB — POC URINALSYSI DIPSTICK (AUTOMATED)
BILIRUBIN UA: NEGATIVE
Blood, UA: POSITIVE
Glucose, UA: NEGATIVE
KETONES UA: NEGATIVE
Nitrite, UA: NEGATIVE
PROTEIN UA: POSITIVE — AB
SPEC GRAV UA: 1.02 (ref 1.010–1.025)
Urobilinogen, UA: 0.2 E.U./dL
pH, UA: 6 (ref 5.0–8.0)

## 2018-11-04 NOTE — Patient Instructions (Addendum)
Glad you are doing better .  Stay hydrated .  Finish the  cipro   And then rov   Week of feb 24th .  Let us know if relapsing sx and we will reevaluate. Before  then

## 2018-11-05 ENCOUNTER — Encounter: Payer: Self-pay | Admitting: Internal Medicine

## 2018-11-15 NOTE — Progress Notes (Signed)
Chief Complaint  Patient presents with  . Follow-up    pt is following up on pyuria. pt states she is feeling better.    HPI: Janet Brock 63 y.o. come in for   53 .9  Still slight vaginal discharge.  NO labs pain fever utis feels fine  And normal  Due for gyne check in march  No new meds  ROS: See pertinent positives and negatives per HPI.  Past Medical History:  Diagnosis Date  . Anemia    iron supplements  . History of varicella   . Hypothyroidism    synthroid  ? hyperthyroid when young and thin and then  dx hypo dx at clinic at work   . Migraines    pre menopausal  hx of vicodin  rescue     Family History  Problem Relation Age of Onset  . Cancer Mother 82       Breast Cancer  . Breast cancer Mother   . Alcohol abuse Father   . Heart disease Father   . Dementia Father   . Lymphoma Sister   . Cancer Maternal Aunt        Breast, Skin  . Leukemia Maternal Aunt   . Breast cancer Maternal Aunt   . Thyroid disease Sister     Social History   Socioeconomic History  . Marital status: Single    Spouse name: Not on file  . Number of children: Not on file  . Years of education: Not on file  . Highest education level: Not on file  Occupational History  . Not on file  Social Needs  . Financial resource strain: Not on file  . Food insecurity:    Worry: Not on file    Inability: Not on file  . Transportation needs:    Medical: Not on file    Non-medical: Not on file  Tobacco Use  . Smoking status: Never Smoker  . Smokeless tobacco: Never Used  Substance and Sexual Activity  . Alcohol use: Yes    Alcohol/week: 2.0 standard drinks    Types: 2 Glasses of wine per week  . Drug use: No  . Sexual activity: Yes    Partners: Male    Birth control/protection: Surgical  Lifestyle  . Physical activity:    Days per week: Not on file    Minutes per session: Not on file  . Stress: Not on file  Relationships  . Social connections:    Talks on phone: Not on file      Gets together: Not on file    Attends religious service: Not on file    Active member of club or organization: Not on file    Attends meetings of clubs or organizations: Not on file    Relationship status: Not on file  Other Topics Concern  . 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    From [place] has lived in [place] and [place].   Mom lives in [place] father lives in [place].   g3 p3   Father passed 2016  alzhiemer prostate cancer age 61   Mom 88 generally  well    Outpatient Medications Prior to Visit  Medication Sig Dispense Refill  . ASPIRIN 81 PO Take 1 tablet by mouth daily.    Marland Kitchen levothyroxine (SYNTHROID, LEVOTHROID) 100 MCG tablet Take 1 tablet (100 mcg total) by mouth daily. 90 tablet 3  . Multiple Vitamin (MULTI VITAMIN DAILY PO) Multi Vitamin    . naproxen (NAPROSYN) 500 MG tablet Take 1 tablet by mouth 2 (two) times daily with a meal.    . ondansetron (ZOFRAN-ODT) 4 MG disintegrating tablet Take 1 tablet (4 mg total) by mouth every 8 (eight) hours as needed for nausea or vomiting. 20 tablet 0   No facility-administered medications prior to visit.      EXAM:  BP 112/62 (BP Location: Right Arm, Patient Position: Sitting, Cuff Size: Normal)   Pulse 75   Temp 98.9 F (37.2 C) (Oral)   Wt 149 lb 3.2 oz (67.7 kg)   LMP 12/21/2013   BMI 24.45 kg/m   Body mass index is 24.45 kg/m.  GENERAL: vitals reviewed and listed above, alert, oriented, appears well hydrated and in no acute distress HEENT: atraumatic, conjunctiva  clear, no obvious abnormalities on inspection of external nose and ears PSYCH: pleasant and cooperative, no obvious depression or anxiety  BP Readings from Last 3 Encounters:  11/16/18 112/62   11/04/18 110/62  10/29/18 110/68  ua  Pos leuk sent for micro   ASSESSMENT AND PLAN:  Discussed the following assessment and plan:  Pyuria - Plan: POCT urinalysis dipstick, Culture, Urine, Urinalysis, Routine w reflex microscopic, Urinalysis, Routine w reflex microscopic  Vaginal discharge Micro looks like not a good clean catch  Pos  Sq cells   Disc doing a vaginal exam today  Or get he gyn to check   She certainly is  Well today  Without sx . Prefers to have gyne check it out  She can try empiric  -Patient advised to return or notify health care team  if  new concerns arise.  Patient Instructions  Jim Desanctis  You are better  Still with some WBC in urine but this indeed could be vaginal  Issue.     Advise vaginal exam by your gyne   Option to  treat empirically for yeast with  OTC .  Monistat generic 3-7 nights.  Will send info  To Dr.   Sharyon Cable office  .  Let us know in interim if  Any concerns.     Standley Brooking.  M.D.

## 2018-11-16 ENCOUNTER — Encounter: Payer: Self-pay | Admitting: Internal Medicine

## 2018-11-16 ENCOUNTER — Ambulatory Visit (INDEPENDENT_AMBULATORY_CARE_PROVIDER_SITE_OTHER): Payer: PRIVATE HEALTH INSURANCE | Admitting: Internal Medicine

## 2018-11-16 VITALS — BP 112/62 | HR 75 | Temp 98.9°F | Wt 149.2 lb

## 2018-11-16 DIAGNOSIS — R8281 Pyuria: Secondary | ICD-10-CM

## 2018-11-16 DIAGNOSIS — N898 Other specified noninflammatory disorders of vagina: Secondary | ICD-10-CM

## 2018-11-16 LAB — POCT URINALYSIS DIPSTICK
Bilirubin, UA: NEGATIVE
Blood, UA: NEGATIVE
GLUCOSE UA: NEGATIVE
Ketones, UA: NEGATIVE
NITRITE UA: NEGATIVE
PROTEIN UA: POSITIVE — AB
Urobilinogen, UA: 0.2 E.U./dL
pH, UA: 6 (ref 5.0–8.0)

## 2018-11-16 LAB — URINALYSIS, ROUTINE W REFLEX MICROSCOPIC
Bilirubin Urine: NEGATIVE
KETONES UR: NEGATIVE
NITRITE: NEGATIVE
SPECIFIC GRAVITY, URINE: 1.025 (ref 1.000–1.030)
Total Protein, Urine: NEGATIVE
URINE GLUCOSE: NEGATIVE
UROBILINOGEN UA: 0.2 (ref 0.0–1.0)
pH: 6 (ref 5.0–8.0)

## 2018-11-16 NOTE — Patient Instructions (Addendum)
Glad  You are better  Still with some WBC in urine but this indeed could be vaginal  Issue.     Advise vaginal exam by your gyne   Option to  treat empirically for yeast with  OTC .  Monistat generic 3-7 nights.  Will send info  To Dr.   Sharyon Cable office  .  Let us know in interim if  Any concerns.

## 2018-11-17 LAB — URINE CULTURE
MICRO NUMBER:: 239911
SPECIMEN QUALITY: ADEQUATE

## 2018-12-20 ENCOUNTER — Other Ambulatory Visit: Payer: PRIVATE HEALTH INSURANCE

## 2019-02-23 ENCOUNTER — Other Ambulatory Visit: Payer: Self-pay

## 2019-02-23 ENCOUNTER — Ambulatory Visit
Admission: RE | Admit: 2019-02-23 | Discharge: 2019-02-23 | Disposition: A | Discharge status: Home / Self Care | Payer: PRIVATE HEALTH INSURANCE | Source: Ambulatory Visit | Attending: Obstetrics and Gynecology | Admitting: Obstetrics and Gynecology

## 2019-02-23 DIAGNOSIS — N63 Unspecified lump in unspecified breast: Secondary | ICD-10-CM

## 2019-05-29 ENCOUNTER — Other Ambulatory Visit: Payer: Self-pay | Admitting: Internal Medicine

## 2019-07-18 ENCOUNTER — Other Ambulatory Visit: Payer: Self-pay

## 2019-07-18 NOTE — Progress Notes (Signed)
Chief Complaint  Patient presents with  . Ear Pain    Rt ear pain with dizziness    HPI: Janet Brock 63 y.o. come in for ongoing ear pain   Onset  A week ago when she had sinus and ear fullness.  Mostly her right ear was bothering her with discomfort.  Was on weekend and went to [facility] clinic on October 18 was given Augmentin and was told she had fluid and infection in both ears had serious vomiting and diarrhea and went back on the 19th to get a different antibiotic name she does not have but was taken twice daily and finished it recently.    She was told that at the clinic they sawfluid in ears  Infected  And left more than right at this time her right ear still feels like squishy no discharge hurts to push on the anterior ear area but no pain with chewing moving her head swallowing.  No fever or headache.  She may feel occasionally lightheaded but no chest pain shortness of breath. Overall she is improved but it still hurts to "mash outside her ear" sound may be squishy but denies any hearing loss and occasional faint lightheadedness but no balance problems weakness or numbness. Of note she reports that she had an episode of her first time vertigo 3 weeks ago when she leaned back I believe in her vehicle had sudden onset of vertigo and took about a day or 2 to get better there was no associated hearing loss falling vomiting neurologic symptoms or headache. She states she is known to have allergies but not really a flare at this time does take a decongestant at times for her allergies. She uses Q-tips on the outside and right at the opening after her shower.  Denies any trauma or discharge. First ear infection.  As adult.  She had multiple ones as a child.   ROS: See pertinent positives and negatives per HPI. No fever chills but did have sinus pressure  At onset.    No cough sneeze .   No flar of allergy.   q tip outside    .   Past Medical History:  Diagnosis Date  . Anemia    iron  supplements  . History of varicella   . Hypothyroidism    synthroid  ? hyperthyroid when young and thin and then  dx hypo dx at clinic at work   . Migraines    pre menopausal  hx of vicodin  rescue     Family History  Problem Relation Age of Onset  . Cancer Mother 77       Breast Cancer  . Breast cancer Mother   . Alcohol abuse Father   . Heart disease Father   . Dementia Father   . Lymphoma Sister   . Cancer Maternal Aunt        Breast, Skin  . Leukemia Maternal Aunt   . Breast cancer Maternal Aunt   . Thyroid disease Sister     Social History   Socioeconomic History  . Marital status: Single    Spouse name: Not on file  . Number of children: Not on file  . Years of education: Not on file  . Highest education level: Not on file  Occupational History  . Not on file  Social Needs  . Financial resource strain: Not on file  . Food insecurity    Worry: Not on file    Inability: Not  on file  . Transportation needs    Medical: Not on file    Non-medical: Not on file  Tobacco Use  . Smoking status: Never Smoker  . Smokeless tobacco: Never Used  Substance and Sexual Activity  . Alcohol use: Yes    Alcohol/week: 2.0 standard drinks    Types: 2 Glasses of wine per week  . Drug use: No  . Sexual activity: Yes    Partners: Male    Birth control/protection: Surgical  Lifestyle  . Physical activity    Days per week: Not on file    Minutes per session: Not on file  . Stress: Not on file  Relationships  . Social Herbalist on phone: Not on file    Gets together: Not on file    Attends religious service: Not on file    Active member of club or organization: Not on file    Attends meetings of clubs or organizations: Not on file    Relationship status: Not on file  Other Topics Concern  . 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    From [place] has lived in [place] and [place].   Mom lives in [place] father lives in [place].   g3 p3   Father passed 2016  alzhiemer prostate cancer age 106   Mom 67 generally well    Outpatient Medications Prior to Visit  Medication Sig Dispense Refill  . ASPIRIN 81 PO Take 1 tablet by mouth daily.    Marland Kitchen levothyroxine (SYNTHROID) 100 MCG tablet TAKE 1 TABLET BY MOUTH EVERY DAY 90 tablet 3  . Multiple Vitamin (MULTI VITAMIN DAILY PO) Multi Vitamin     No facility-administered medications prior to visit.      EXAM:  BP 110/60   Temp (!) 97 F (36.1 C) (Temporal)   Wt 146 lb (66.2 kg)   LMP 12/21/2013   BMI 23.93 kg/m   Body mass index is 23.93 kg/m.  GENERAL: vitals reviewed and listed above, alert, oriented, appears well hydrated and in no acute distress HEENT: atraumatic, conjunctiva  clear, no obvious abnormalities on inspection of external nose and ears she does have convoluted ear canals but left TM is normal right TM might have a slightly splayed light reflex but bony landmarks are intact ear canal has no discharge or swelling or rash she has some tenderness that is erratic at the tragal area but not at the TMJ joint OP : no lesion edema or exudate tongue is midline.  NECK: no obvious masses on inspection palpation  LUNGS: clear to auscultation bilaterally, no wheezes, rales or rhonchi, good air movement CV: HRRR, no clubbing cyanosis or  peripheral edema nl cap refill  MS: moves all extremities without noticeable focal  Abnormality Neurologic appears intact cranial nerves III through XII gait is normal negative Romberg excellent balance finger-to-nose. PSYCH: pleasant and cooperative, no obvious depression or anxiety  BP Readings from Last 3 Encounters:  07/19/19 110/60  11/16/18 112/62  11/04/18 110/62    ASSESSMENT AND PLAN:   Discussed the following assessment and plan:  Otalgia of right ear  Light-headed  feeling off and on  - see text  She has palpated ear pain that is at the tragus that may be referred or related to the outer ear canal but I see no acute external otitis.  Have her stop Q-tips altogether I see no foreign body suspect her symptoms are resolving middle ear possibly eustachian tube dysfunction or lightheadedness is very vague although she had a episode of what sounds like positional vertigo 3 weeks ago.  At this time because no alarm findings on exam we will watch and give more time.  Do not use Q-tips in the ears offered to use Flonase but states that she does not usually need that for congestion. If not improved in 2 to 3 weeks or any alarm symptoms progression contact us for further evaluation consideration of ENT evaluation.  She denies hearing loss at this time  -Patient advised to return or notify health care team  if  new concerns arise.  Patient Instructions  You exam is reassuring      Poss mild irritation or ear canal but dont see any  Swelling or  Discharge.    I dont see   bacteria infection.   Do not use q tips in  Canal   Let us know in a few weeks if not all better  Your neuro exam is good as well as heart and lung exam .    If needed we can get ENT to see  You but no alarm findings today .    Standley Brooking.  M.D.

## 2019-07-19 ENCOUNTER — Encounter: Payer: Self-pay | Admitting: Internal Medicine

## 2019-07-19 ENCOUNTER — Ambulatory Visit (INDEPENDENT_AMBULATORY_CARE_PROVIDER_SITE_OTHER): Payer: PRIVATE HEALTH INSURANCE | Admitting: Internal Medicine

## 2019-07-19 VITALS — BP 110/60 | Temp 97.0°F | Wt 146.0 lb

## 2019-07-19 DIAGNOSIS — R42 Dizziness and giddiness: Secondary | ICD-10-CM

## 2019-07-19 DIAGNOSIS — H9201 Otalgia, right ear: Secondary | ICD-10-CM | POA: Diagnosis not present

## 2019-07-19 NOTE — Patient Instructions (Signed)
You exam is reassuring      Poss mild irritation or ear canal but dont see any  Swelling or  Discharge.    I dont see   bacteria infection.   Do not use q tips in  Canal   Let us know in a few weeks if not all better  Your neuro exam is good as well as heart and lung exam .    If needed we can get ENT to see  You but no alarm findings today .

## 2020-07-17 ENCOUNTER — Other Ambulatory Visit: Payer: Self-pay | Admitting: Internal Medicine

## 2021-01-13 ENCOUNTER — Other Ambulatory Visit: Payer: Self-pay | Admitting: Internal Medicine

## 2021-02-06 ENCOUNTER — Other Ambulatory Visit: Payer: Self-pay | Admitting: Internal Medicine

## 2021-04-04 DIAGNOSIS — Z124 Encounter for screening for malignant neoplasm of cervix: Secondary | ICD-10-CM | POA: Diagnosis not present

## 2021-04-04 DIAGNOSIS — Z01419 Encounter for gynecological examination (general) (routine) without abnormal findings: Secondary | ICD-10-CM | POA: Diagnosis not present

## 2021-04-09 ENCOUNTER — Other Ambulatory Visit: Payer: Self-pay | Admitting: Obstetrics and Gynecology

## 2021-04-09 DIAGNOSIS — Z13820 Encounter for screening for osteoporosis: Secondary | ICD-10-CM

## 2021-04-11 ENCOUNTER — Other Ambulatory Visit: Payer: Self-pay | Admitting: Obstetrics and Gynecology

## 2021-04-11 DIAGNOSIS — N6310 Unspecified lump in the right breast, unspecified quadrant: Secondary | ICD-10-CM

## 2021-04-29 ENCOUNTER — Encounter: Payer: Self-pay | Admitting: Gastroenterology

## 2021-05-15 ENCOUNTER — Other Ambulatory Visit: Payer: PRIVATE HEALTH INSURANCE

## 2021-05-31 ENCOUNTER — Encounter: Payer: Self-pay | Admitting: *Deleted

## 2021-06-04 ENCOUNTER — Other Ambulatory Visit: Payer: Self-pay

## 2021-06-04 NOTE — Progress Notes (Signed)
Chief Complaint  Patient presents with   Annual Exam     HPI: Patient  Janet Brock  65 y.o. comes in today for [place] visit  Thyroid  no change  Generally well  utd [place] Maintenance  Topic Date Due   Zoster Vaccines- Shingrix (1 of 2) Never done   COLONOSCOPY (Pts 45-21yr Insurance coverage will need to be confirmed)  09/23/2015   PAP SMEAR-Modifier  07/15/2019   COVID-19 Vaccine (4 - Booster for Pfizer series) 10/17/2020   MAMMOGRAM  02/22/2021   TETANUS/TDAP  06/06/2024   INFLUENZA VACCINE  Completed   DEXA SCAN  Completed   Hepatitis C Screening  Completed   HIV Screening  Completed   HPV VACCINES  Aged Out   Health Maintenance Review LIFESTYLE:  Exercise:  not as much  walking  to begin yoga  silver  sneakers.   Tobacco/ETS:n Alcohol: 1 3 xper week  Sugar beverages: Sleep: not enough  7  Drug use: no HH of  2  dogs  8 lots  3 cats   Work: part time to FT  40 hours 9-5   remotely   Mammo and Dexa scheduled and colonoscopy scheduled.   Dr AOuida Sills    ROS:   REST of 12 system review negative except as per HPI   Past Medical History:  Diagnosis Date   Anemia    iron supplements   History of varicella    Hypothyroidism    synthroid  ? hyperthyroid when young and thin and then  dx hypo dx at clinic at work    Migraines    pre menopausal  hx of vicodin  rescue     Past Surgical History:  Procedure Laterality Date   ABLATION     ? year    btl     COLONOSCOPY     approx 2 years ago   HYSTEROSCOPY  05/08/2011   Procedure: HYSTEROSCOPY WITH HYDROTHERMAL ABLATION;  Surgeon: MOlga Millers  [facility];  Service: Gynecology;  Laterality: N/A;   svd       x 2   WISDOM TOOTH EXTRACTION      Family History  Problem Relation Age of Onset   Cancer Mother 851      Breast Cancer   Breast cancer Mother    Alcohol abuse Father    Heart disease Father    Dementia Father    Lymphoma Sister    Cancer Maternal Aunt         Breast, Skin   Leukemia Maternal Aunt    Breast cancer Maternal Aunt    Thyroid disease Sister     Social History   Socioeconomic History   Marital status: Single    Spouse name: Not on file   Number of children: Not on file   Years of education: Not on file   Highest education level: Not on file  Occupational History   Not on file  Tobacco Use   Smoking status: Never   Smokeless tobacco: Never  Substance and Sexual Activity   Alcohol use: Yes    Alcohol/week: 2.0 standard drinks    Types: 2 Glasses of wine per week   Drug use: No   Sexual activity: Yes    Partners: Male    Birth control/protection: Surgical  Other Topics Concern   Not on file  Social History Narrative   Receives 7 hours of sleep per night   Lives at  home with her partner and sometimes her youngest son     Has 10 dogs (has show dogs), 1 cat and 5 goats   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    From [place] has lived in [place] and [place].   Mom lives in [place] father lives in [place].   g3 p3   Father passed 2016  alzhiemer prostate cancer age 20   Mom 59 generally well   Social Determinants of Radio broadcast assistant Strain: Not on file  Food Insecurity: Not on file  Transportation Needs: Not on file  Physical Activity: Not on file  Stress: Not on file  Social Connections: Not on file    Outpatient Medications Prior to Visit  Medication Sig Dispense Refill   ASPIRIN 81 PO Take 1 tablet by mouth daily.     levothyroxine (SYNTHROID) 100 MCG tablet TAKE 1 TABLET BY MOUTH EVERY DAY 30 tablet 0   Multiple Vitamin (MULTI VITAMIN DAILY PO) Multi Vitamin     No facility-administered medications prior to visit.     EXAM:  BP 104/60 (BP Location: Left Arm, Patient Position: Sitting, Cuff Size: Normal)   Pulse 60   Temp 98.5 F (36.9 C) (Oral)   Ht '5\' 6"'$  (1.676 m)   Wt 144 lb 12.8 oz (65.7 kg)   LMP 12/21/2013   SpO2 98%    BMI 23.37 kg/m   Body mass index is 23.37 kg/m. Wt Readings from Last 3 Encounters:  06/05/21 144 lb 12.8 oz (65.7 kg)  07/19/19 146 lb (66.2 kg)  11/16/18 149 lb 3.2 oz (67.7 kg)    Physical Exam: Vital signs reviewed [id number] is a well-developed well-nourished alert cooperative    who appearsr stated age in no acute distress.  HEENT: normocephalic atraumatic , Eyes: PERRL EOM's full, conjunctiva clear, discharge or tenderness., Ears: no deformity EAC's clear TMs with normal landmarks. Mouth: masked  NECK: supple without masses, thyromegaly or bruits. CHEST/PULM:  Clear to auscultation and percussion breath sounds equal no wheeze , rales or rhonchi. No chest wall deformities or tenderness. Breast: normal by inspection . No dimpling, discharge, masses, tenderness or discharge . CV: PMI is nondisplaced, S1 S2 no gallops, murmurs, rubs. Peripheral pulses are full without delay.No JVD .  ABDOMEN: Bowel sounds normal nontender  No guard or rebound, no hepato splenomegal no CVA tenderness.  Extremtities:  No clubbing cyanosis or edema, no acute joint swelling or redness no focal atrophy NEURO:  Oriented x3, cranial nerves 3-12 appear to be intact, no obvious focal weakness,gait within normal limits no abnormal reflexes or asymmetrical SKIN: No acute rashes normal turgor, color, no bruising or petechiae. PSYCH: Oriented, good eye contact, no obvious depression anxiety, cognition and judgment appear normal. LN: no cervical axillary inguinal adenopathy  Lab Results  Component Value Date   WBC 9.2 06/05/2021   HGB 14.1 06/05/2021   HCT 42.1 06/05/2021   PLT 331.0 06/05/2021   GLUCOSE 86 06/05/2021   CHOL 199 06/05/2021   TRIG 55.0 06/05/2021   HDL 76.80 06/05/2021   LDLCALC 111 (H) 06/05/2021   ALT 18 06/05/2021   AST 16 06/05/2021   NA 139 06/05/2021   K 4.2 06/05/2021   CL 103 06/05/2021   CREATININE 0.89 06/05/2021   BUN 19 06/05/2021   CO2 30 06/05/2021   TSH 1.40 06/05/2021    HGBA1C 5.5 06/05/2021  BP Readings from Last 3 Encounters:  06/05/21 104/60  07/19/19 110/60  11/16/18 112/62    Lab rplan  NF reviewed with patient   ASSESSMENT AND PLAN:  Discussed the following assessment and plan:    ICD-10-CM   1. Visit for preventive health examination  Z00.00     2. Medication management  123456 Basic metabolic panel    CBC with Differential/Platelet    Hepatic function panel    Lipid panel    TSH    T4, free    Hemoglobin A1c    Hemoglobin A1c    T4, free    TSH    Lipid panel    Hepatic function panel    CBC with Differential/Platelet    Basic metabolic panel    3. Other specified hypothyroidism  [id number] Basic metabolic panel    CBC with Differential/Platelet    Hepatic function panel    Lipid panel    TSH    T4, free    T4, free    TSH    Lipid panel    Hepatic function panel    CBC with Differential/Platelet    Basic metabolic panel    4. Urinary frequency  [id number] Basic metabolic panel    CBC with Differential/Platelet    Hepatic function panel    Lipid panel    TSH    T4, free    Hemoglobin A1c    Hemoglobin A1c    T4, free    TSH    Lipid panel    Hepatic function panel    CBC with Differential/Platelet    Basic metabolic panel    POCT Urinalysis Dipstick (Automated)    5. Iron deficiency hx of   [id number] Basic metabolic panel    CBC with Differential/Platelet    Hepatic function panel    Lipid panel    TSH    T4, free    T4, free    TSH    Lipid panel    Hepatic function panel    CBC with Differential/Platelet    Basic metabolic panel    6. Need for lipid screening  [id number] Basic metabolic panel    CBC with Differential/Platelet    Hepatic function panel    Lipid panel    TSH    T4, free    T4, free    TSH    Lipid panel    Hepatic function panel    CBC with Differential/Platelet    Basic metabolic panel    7. Elevated LDL cholesterol level  E78.00 Hemoglobin A1c    Hemoglobin A1c    8. Need for  influenza vaccination  Z23 Flu Vaccine QUAD High Dose(Fluad)    9. Need for pneumococcal vaccination  Z23 Pneumococcal conjugate vaccine 20-valent (Prevnar 20)    10. Proteinuria, unspecified type  R80.9 Microalbumin/Creatinine Ratio, Urine     Return in about 1 year (around 06/05/2022) for depending on results.  Patient Care Team: Antavius Sperbeck, Standley Brooking, MD as PCP - General (Internal Medicine) Olga Millers, MD as Consulting Physician (Obstetrics and Gynecology) Patient Instructions  Good to see you today .   Will notify you  of labs when available.   On My chart .  Continue lifestyle intervention healthy eating and exercise .   Mediterranean eating is heart healthy  but check portion size  . Track intake sleep and activity for at  lease 2 weeks to get a sense of what to improve.   Keto  is a form of low processed carb eating.   Get your colonoscopy mammogram and dexa as planned . If needed we can get nutrition  dietary consult but  your weight is still in good range  Shingrix vaccine at  pharmacy   Health Maintenance, Female Adopting a healthy lifestyle and getting preventive care are important in promoting health and wellness. Ask your health care provider about: The right schedule for you to have regular tests and exams. Things you can do on your own to prevent diseases and keep yourself healthy. What should I know about diet, weight, and exercise? Eat a healthy diet  Eat a diet that includes plenty of vegetables, fruits, low-fat dairy products, and lean protein. Do not eat a lot of foods that are high in solid fats, added sugars, or sodium. Maintain a healthy weight Body mass index (BMI) is used to identify weight problems. It estimates body fat based on height and weight. Your health care provider can help determine your BMI and help you achieve or maintain a healthy weight. Get regular exercise Get regular exercise. This is one of the most important things you can do for  your health. Most adults should: Exercise for at least 150 minutes each week. The exercise should increase your heart rate and make you sweat (moderate-intensity exercise). Do strengthening exercises at least twice a week. This is in addition to the moderate-intensity exercise. Spend less time sitting. Even light physical activity can be beneficial. Watch cholesterol and blood lipids Have your blood tested for lipids and cholesterol at 65 years of age, then have this test every 5 years. Have your cholesterol levels checked more often if: Your lipid or cholesterol levels are high. You are older than 65 years of age. You are at high risk for heart disease. What should I know about cancer screening? Depending on your health history and family history, you may need to have cancer screening at various ages. This may include screening for: Breast cancer. Cervical cancer. Colorectal cancer. Skin cancer. Lung cancer. What should I know about heart disease, diabetes, and high blood pressure? Blood pressure and heart disease High blood pressure causes heart disease and increases the risk of stroke. This is more likely to develop in people who have high blood pressure readings, are of African descent, or are overweight. Have your blood pressure checked: Every 3-5 years if you are 1-2 years of age. Every year if you are 55 years old or older. Diabetes Have regular diabetes screenings. This checks your fasting blood sugar level. Have the screening done: Once every three years after age 50 if you are at a normal weight and have a low risk for diabetes. More often and at a younger age if you are overweight or have a high risk for diabetes. What should I know about preventing infection? Hepatitis B If you have a higher risk for hepatitis B, you should be screened for this virus. Talk with your health care provider to find out if you are at risk for hepatitis B infection. Hepatitis C Testing is  recommended for: Everyone born from [birth year] through 1965. Anyone with known risk factors for hepatitis C. Sexually transmitted infections (STIs) Get screened for STIs, including gonorrhea and chlamydia, if: You are sexually active and are younger than 65 years of age. You are older than 65 years of age and your health care provider tells you that you are at risk for this type of infection. Your sexual activity has changed since you  were last screened, and you are at increased risk for chlamydia or gonorrhea. Ask your health care provider if you are at risk. Ask your health care provider about whether you are at high risk for HIV. Your health care provider may recommend a prescription medicine to help prevent HIV infection. If you choose to take medicine to prevent HIV, you should first get tested for HIV. You should then be tested every 3 months for as long as you are taking the medicine. Pregnancy If you are about to stop having your period (premenopausal) and you may become pregnant, seek counseling before you get pregnant. Take 400 to 800 micrograms (mcg) of folic acid every day if you become pregnant. Ask for birth control (contraception) if you want to prevent pregnancy. Osteoporosis and menopause Osteoporosis is a disease in which the bones lose minerals and strength with aging. This can result in bone fractures. If you are 18 years old or older, or if you are at risk for osteoporosis and fractures, ask your health care provider if you should: Be screened for bone loss. Take a calcium or vitamin D supplement to lower your risk of fractures. Be given hormone replacement therapy (HRT) to treat symptoms of menopause. Follow these instructions at home: Lifestyle Do not use any products that contain nicotine or tobacco, such as cigarettes, e-cigarettes, and chewing tobacco. If you need help quitting, ask your health care provider. Do not use street drugs. Do not share needles. Ask your health  care provider for help if you need support or information about quitting drugs. Alcohol use Do not drink alcohol if: Your health care provider tells you not to drink. You are pregnant, may be pregnant, or are planning to become pregnant. If you drink alcohol: Limit how much you use to 0-1 drink a day. Limit intake if you are breastfeeding. Be aware of how much alcohol is in your drink. In the [place] one drink equals one 12 oz bottle of beer (355 mL), one 5 oz glass of wine (148 mL), or one 1 oz glass of hard liquor (44 mL). General instructions Schedule regular health, dental, and eye exams. Stay current with your vaccines. Tell your health care provider if: You often feel depressed. You have ever been abused or do not feel safe at home. Summary Adopting a healthy lifestyle and getting preventive care are important in promoting health and wellness. Follow your health care provider's instructions about healthy diet, exercising, and getting tested or screened for diseases. Follow your health care provider's instructions on monitoring your cholesterol and blood pressure. This information is not intended to replace advice given to you by your health care provider. Make sure you discuss any questions you have with your health care provider. Document Revised: 11/16/2020 Document Reviewed: 09/01/2018 Elsevier Patient Education  2022 [employer]. Lenell Mcconnell M.D.

## 2021-06-05 ENCOUNTER — Encounter: Payer: Self-pay | Admitting: Internal Medicine

## 2021-06-05 ENCOUNTER — Ambulatory Visit (INDEPENDENT_AMBULATORY_CARE_PROVIDER_SITE_OTHER): Payer: Medicare HMO | Admitting: Internal Medicine

## 2021-06-05 VITALS — BP 104/60 | HR 60 | Temp 98.5°F | Ht 66.0 in | Wt 144.8 lb

## 2021-06-05 DIAGNOSIS — Z0000 Encounter for general adult medical examination without abnormal findings: Secondary | ICD-10-CM

## 2021-06-05 DIAGNOSIS — R809 Proteinuria, unspecified: Secondary | ICD-10-CM

## 2021-06-05 DIAGNOSIS — Z79899 Other long term (current) drug therapy: Secondary | ICD-10-CM

## 2021-06-05 DIAGNOSIS — R350 Frequency of micturition: Secondary | ICD-10-CM

## 2021-06-05 DIAGNOSIS — E7800 Pure hypercholesterolemia, unspecified: Secondary | ICD-10-CM | POA: Diagnosis not present

## 2021-06-05 DIAGNOSIS — Z13220 Encounter for screening for lipoid disorders: Secondary | ICD-10-CM | POA: Diagnosis not present

## 2021-06-05 DIAGNOSIS — E038 Other specified hypothyroidism: Secondary | ICD-10-CM | POA: Diagnosis not present

## 2021-06-05 DIAGNOSIS — E611 Iron deficiency: Secondary | ICD-10-CM | POA: Diagnosis not present

## 2021-06-05 DIAGNOSIS — Z23 Encounter for immunization: Secondary | ICD-10-CM

## 2021-06-05 LAB — CBC WITH DIFFERENTIAL/PLATELET
Basophils Absolute: 0.1 10*3/uL (ref 0.0–0.1)
Basophils Relative: 0.9 % (ref 0.0–3.0)
Eosinophils Absolute: 0.2 10*3/uL (ref 0.0–0.7)
Eosinophils Relative: 1.8 % (ref 0.0–5.0)
HCT: 42.1 % (ref 36.0–46.0)
Hemoglobin: 14.1 g/dL (ref 12.0–15.0)
Lymphocytes Relative: 29.5 % (ref 12.0–46.0)
Lymphs Abs: 2.7 10*3/uL (ref 0.7–4.0)
MCHC: 33.6 g/dL (ref 30.0–36.0)
MCV: 92.9 fl (ref 78.0–100.0)
Monocytes Absolute: 0.6 10*3/uL (ref 0.1–1.0)
Monocytes Relative: 6.8 % (ref 3.0–12.0)
Neutro Abs: 5.6 10*3/uL (ref 1.4–7.7)
Neutrophils Relative %: 61 % (ref 43.0–77.0)
Platelets: 331 10*3/uL (ref 150.0–400.0)
RBC: 4.54 Mil/uL (ref 3.87–5.11)
RDW: 14 % (ref 11.5–15.5)
WBC: 9.2 10*3/uL (ref 4.0–10.5)

## 2021-06-05 LAB — BASIC METABOLIC PANEL
BUN: 19 mg/dL (ref 6–23)
CO2: 30 mEq/L (ref 19–32)
Calcium: 9 mg/dL (ref 8.4–10.5)
Chloride: 103 mEq/L (ref 96–112)
Creatinine, Ser: 0.89 mg/dL (ref 0.40–1.20)
GFR: 67.98 mL/min (ref >60.00)
Glucose, Bld: 86 mg/dL (ref 70–99)
Potassium: 4.2 mEq/L (ref 3.5–5.1)
Sodium: 139 mEq/L (ref 135–145)

## 2021-06-05 LAB — POC URINALSYSI DIPSTICK (AUTOMATED)
Bilirubin, UA: NEGATIVE
Blood, UA: NEGATIVE
Glucose, UA: NEGATIVE
Ketones, UA: NEGATIVE
Leukocytes, UA: NEGATIVE
Nitrite, UA: NEGATIVE
Protein, UA: POSITIVE — AB
Spec Grav, UA: >=1.03 — AB (ref 1.010–1.025)
Urobilinogen, UA: 0.2 E.U./dL
pH, UA: 6 (ref 5.0–8.0)

## 2021-06-05 LAB — HEPATIC FUNCTION PANEL
ALT: 18 U/L (ref 0–35)
AST: 16 U/L (ref 0–37)
Albumin: 4.1 g/dL (ref 3.5–5.2)
Alkaline Phosphatase: 68 U/L (ref 39–117)
Bilirubin, Direct: 0.1 mg/dL (ref 0.0–0.3)
Total Bilirubin: 0.7 mg/dL (ref 0.2–1.2)
Total Protein: 6.8 g/dL (ref 6.0–8.3)

## 2021-06-05 LAB — HEMOGLOBIN A1C: Hgb A1c MFr Bld: 5.5 % (ref 4.6–6.5)

## 2021-06-05 LAB — LIPID PANEL
Cholesterol: 199 mg/dL (ref 0–200)
HDL: 76.8 mg/dL (ref >39.00)
LDL Cholesterol: 111 mg/dL — ABNORMAL HIGH (ref 0–99)
NonHDL: 122.33
Total CHOL/HDL Ratio: 3
Triglycerides: 55 mg/dL (ref 0.0–149.0)
VLDL: 11 mg/dL (ref 0.0–40.0)

## 2021-06-05 LAB — T4, FREE: Free T4: 1.08 ng/dL (ref 0.60–1.60)

## 2021-06-05 LAB — TSH: TSH: 1.4 u[IU]/mL (ref 0.35–5.50)

## 2021-06-05 NOTE — Patient Instructions (Addendum)
Good to see you today .   Will notify you  of labs when available.   On My chart .  Continue lifestyle intervention healthy eating and exercise .   Mediterranean eating is heart healthy  but check portion size  . Track intake sleep and activity for at  lease 2 weeks to get a sense of what to improve.   Keto is a form of low processed carb eating.   Get your colonoscopy mammogram and dexa as planned . If needed we can get nutrition  dietary consult but  your weight is still in good range  Shingrix vaccine at  pharmacy   Health Maintenance, Female Adopting a healthy lifestyle and getting preventive care are important in promoting health and wellness. Ask your health care provider about: The right schedule for you to have regular tests and exams. Things you can do on your own to prevent diseases and keep yourself healthy. What should I know about diet, weight, and exercise? Eat a healthy diet  Eat a diet that includes plenty of vegetables, fruits, low-fat dairy products, and lean protein. Do not eat a lot of foods that are high in solid fats, added sugars, or sodium. Maintain a healthy weight Body mass index (BMI) is used to identify weight problems. It estimates body fat based on height and weight. Your health care provider can help determine your BMI and help you achieve or maintain a healthy weight. Get regular exercise Get regular exercise. This is one of the most important things you can do for your health. Most adults should: Exercise for at least 150 minutes each week. The exercise should increase your heart rate and make you sweat (moderate-intensity exercise). Do strengthening exercises at least twice a week. This is in addition to the moderate-intensity exercise. Spend less time sitting. Even light physical activity can be beneficial. Watch cholesterol and blood lipids Have your blood tested for lipids and cholesterol at 65 years of age, then have this test every 5  years. Have your cholesterol levels checked more often if: Your lipid or cholesterol levels are high. You are older than 65 years of age. You are at high risk for heart disease. What should I know about cancer screening? Depending on your health history and family history, you may need to have cancer screening at various ages. This may include screening for: Breast cancer. Cervical cancer. Colorectal cancer. Skin cancer. Lung cancer. What should I know about heart disease, diabetes, and high blood pressure? Blood pressure and heart disease High blood pressure causes heart disease and increases the risk of stroke. This is more likely to develop in people who have high blood pressure readings, are of African descent, or are overweight. Have your blood pressure checked: Every 3-5 years if you are 62-43 years of age. Every year if you are 56 years old or older. Diabetes Have regular diabetes screenings. This checks your fasting blood sugar level. Have the screening done: Once every three years after age 32 if you are at a normal weight and have a low risk for diabetes. More often and at a younger age if you are overweight or have a high risk for diabetes. What should I know about preventing infection? Hepatitis B If you have a higher risk for hepatitis B, you should be screened for this virus. Talk with your health care provider to find out if you are at risk for hepatitis B infection. Hepatitis C Testing is recommended for: Everyone born from [birth year] through  [place] with known risk factors for hepatitis C. Sexually transmitted infections (STIs) Get screened for STIs, including gonorrhea and chlamydia, if: You are sexually active and are younger than 65 years of age. You are older than 65 years of age and your health care provider tells you that you are at risk for this type of infection. Your sexual activity has changed since you were last screened, and you are at increased risk for  chlamydia or gonorrhea. Ask your health care provider if you are at risk. Ask your health care provider about whether you are at high risk for HIV. Your health care provider may recommend a prescription medicine to help prevent HIV infection. If you choose to take medicine to prevent HIV, you should first get tested for HIV. You should then be tested every 3 months for as long as you are taking the medicine. Pregnancy If you are about to stop having your period (premenopausal) and you may become pregnant, seek counseling before you get pregnant. Take 400 to 800 micrograms (mcg) of folic acid every day if you become pregnant. Ask for birth control (contraception) if you want to prevent pregnancy. Osteoporosis and menopause Osteoporosis is a disease in which the bones lose minerals and strength with aging. This can result in bone fractures. If you are 37 years old or older, or if you are at risk for osteoporosis and fractures, ask your health care provider if you should: Be screened for bone loss. Take a calcium or vitamin D supplement to lower your risk of fractures. Be given hormone replacement therapy (HRT) to treat symptoms of menopause. Follow these instructions at home: Lifestyle Do not use any products that contain nicotine or tobacco, such as cigarettes, e-cigarettes, and chewing tobacco. If you need help quitting, ask your health care provider. Do not use street drugs. Do not share needles. Ask your health care provider for help if you need support or information about quitting drugs. Alcohol use Do not drink alcohol if: Your health care provider tells you not to drink. You are pregnant, may be pregnant, or are planning to become pregnant. If you drink alcohol: Limit how much you use to 0-1 drink a day. Limit intake if you are breastfeeding. Be aware of how much alcohol is in your drink. In the [place] one drink equals one 12 oz bottle of beer (355 mL), one 5 oz glass of wine (148 mL),  or one 1 oz glass of hard liquor (44 mL). General instructions Schedule regular health, dental, and eye exams. Stay current with your vaccines. Tell your health care provider if: You often feel depressed. You have ever been abused or do not feel safe at home. Summary Adopting a healthy lifestyle and getting preventive care are important in promoting health and wellness. Follow your health care provider's instructions about healthy diet, exercising, and getting tested or screened for diseases. Follow your health care provider's instructions on monitoring your cholesterol and blood pressure. This information is not intended to replace advice given to you by your health care provider. Make sure you discuss any questions you have with your health care provider. Document Revised: 11/16/2020 Document Reviewed: 09/01/2018 Elsevier Patient Education  2022 [employer].

## 2021-06-06 NOTE — Progress Notes (Signed)
Cholesterol slightly elevated but favorable ratio rest of blood work is normal no diabetes thyroid is normal. Urinalysis is clear except for a positive screen for protein which may be insignificant if fasting. Obtain urine microalbumin /creatinine ratio and if negative then there is no significant protein in urine. Patient can have a copy of her lab results if she requests

## 2021-06-14 ENCOUNTER — Other Ambulatory Visit: Payer: Self-pay

## 2021-06-14 ENCOUNTER — Other Ambulatory Visit: Payer: Medicare HMO

## 2021-06-14 DIAGNOSIS — R809 Proteinuria, unspecified: Secondary | ICD-10-CM

## 2021-06-14 LAB — MICROALBUMIN / CREATININE URINE RATIO
Creatinine,U: 30.1 mg/dL
Microalb Creat Ratio: 2.3 mg/g (ref 0.0–30.0)
Microalb, Ur: <0.7 mg/dL (ref 0.0–1.9)

## 2021-06-16 NOTE — Progress Notes (Signed)
Urine shows no increase protein secretion .  Favorable result     No evidence of active kidney disease.

## 2021-06-17 ENCOUNTER — Other Ambulatory Visit: Payer: Self-pay

## 2021-06-17 ENCOUNTER — Telehealth: Payer: Self-pay | Admitting: Internal Medicine

## 2021-06-17 ENCOUNTER — Encounter: Payer: Self-pay | Admitting: Gastroenterology

## 2021-06-17 ENCOUNTER — Ambulatory Visit (AMBULATORY_SURGERY_CENTER): Payer: Medicare HMO | Admitting: *Deleted

## 2021-06-17 VITALS — Ht 67.0 in | Wt 145.0 lb

## 2021-06-17 DIAGNOSIS — Z1211 Encounter for screening for malignant neoplasm of colon: Secondary | ICD-10-CM

## 2021-06-17 MED ORDER — NA SULFATE-K SULFATE-MG SULF 17.5-3.13-1.6 GM/177ML PO SOLN: 1.0000 | Freq: Once | ORAL | 0 refills | Status: AC

## 2021-06-17 NOTE — Telephone Encounter (Signed)
Please see results note.

## 2021-06-17 NOTE — Progress Notes (Signed)
No egg or soy allergy known to patient  No issues known to pt with past sedation with any surgeries or procedures Patient denies ever being told they had issues or difficulty with intubation  No FH of Malignant Hyperthermia Pt is not on diet pills Pt is not on  home 02  Pt is not on blood thinners  Pt denies issues with constipation  No A fib or A flutter   Pt is fully vaccinated  for Covid   Due to the COVID-19 pandemic we are asking patients to follow certain guidelines.  Pt aware of COVID protocols and [facility] guidelines  Pt verified name, DOB, address and insurance during PV today.  Pt mailed instruction packet of Emmi video, copy of consent form to read and not return, and instructions.   PV completed over the phone.  Pt encouraged to call with questions or issues.  My Chart instructions to pt as well

## 2021-06-17 NOTE — Telephone Encounter (Signed)
Patient is returning Mykal phone call.  Patient could be contacted at [phone].  Please advise.

## 2021-06-25 ENCOUNTER — Ambulatory Visit
Admission: RE | Admit: 2021-06-25 | Discharge: 2021-06-25 | Disposition: A | Discharge status: Home / Self Care | Payer: PRIVATE HEALTH INSURANCE | Source: Ambulatory Visit | Attending: Obstetrics and Gynecology | Admitting: Obstetrics and Gynecology

## 2021-06-25 ENCOUNTER — Other Ambulatory Visit: Payer: Self-pay

## 2021-06-25 ENCOUNTER — Ambulatory Visit
Admission: RE | Admit: 2021-06-25 | Discharge: 2021-06-25 | Disposition: A | Discharge status: Home / Self Care | Payer: Medicare HMO | Source: Ambulatory Visit | Attending: Obstetrics and Gynecology | Admitting: Obstetrics and Gynecology

## 2021-06-25 ENCOUNTER — Encounter: Payer: Self-pay | Admitting: Gastroenterology

## 2021-06-25 DIAGNOSIS — N6310 Unspecified lump in the right breast, unspecified quadrant: Secondary | ICD-10-CM

## 2021-06-25 DIAGNOSIS — R922 Inconclusive mammogram: Secondary | ICD-10-CM | POA: Diagnosis not present

## 2021-07-01 ENCOUNTER — Ambulatory Visit (AMBULATORY_SURGERY_CENTER): Payer: Medicare HMO | Admitting: Gastroenterology

## 2021-07-01 ENCOUNTER — Other Ambulatory Visit: Payer: Self-pay

## 2021-07-01 ENCOUNTER — Encounter: Payer: Self-pay | Admitting: Gastroenterology

## 2021-07-01 VITALS — BP 100/67 | HR 55 | Temp 97.8°F | Resp 15 | Ht 65.5 in | Wt 140.0 lb

## 2021-07-01 DIAGNOSIS — K635 Polyp of colon: Secondary | ICD-10-CM | POA: Diagnosis not present

## 2021-07-01 DIAGNOSIS — D125 Benign neoplasm of sigmoid colon: Secondary | ICD-10-CM | POA: Diagnosis not present

## 2021-07-01 DIAGNOSIS — D123 Benign neoplasm of transverse colon: Secondary | ICD-10-CM | POA: Diagnosis not present

## 2021-07-01 DIAGNOSIS — Z1211 Encounter for screening for malignant neoplasm of colon: Secondary | ICD-10-CM | POA: Diagnosis not present

## 2021-07-01 DIAGNOSIS — D120 Benign neoplasm of cecum: Secondary | ICD-10-CM

## 2021-07-01 DIAGNOSIS — E039 Hypothyroidism, unspecified: Secondary | ICD-10-CM | POA: Diagnosis not present

## 2021-07-01 MED ORDER — SODIUM CHLORIDE 0.9 % IV SOLN: 500.0000 mL | Freq: Once | INTRAVENOUS | Status: DC

## 2021-07-01 NOTE — Op Note (Signed)
[place] Patient Name: Janet Brock Procedure Date: 07/01/2021 12:15 PM MRN: [id number] Endoscopist: Remo Lipps P. Havery Moros , MD Age: 65 Referring MD:  Date of Birth: August 01, 1956 Gender: Female Account #: [id number] Procedure:                Colonoscopy Indications:              Screening for colorectal malignant neoplasm. Last                            exam 2010. Medicines:                Monitored Anesthesia Care Procedure:                Pre-Anesthesia Assessment:                           - Prior to the procedure, a History and Physical                            was performed, and patient medications and                            allergies were reviewed. The patient's tolerance of                            previous anesthesia was also reviewed. The risks                            and benefits of the procedure and the sedation                            options and risks were discussed with the patient.                            All questions were answered, and informed consent                            was obtained. Prior Anticoagulants: The patient has                            taken no previous anticoagulant or antiplatelet                            agents. ASA Grade Assessment: II - A patient with                            mild systemic disease. After reviewing the risks                            and benefits, the patient was deemed in                            satisfactory condition to undergo the procedure.  After obtaining informed consent, the colonoscope                            was passed under direct vision. Throughout the                            procedure, the patient's blood pressure, pulse, and                            oxygen saturations were monitored continuously. The                            Olympus PCF-H190DL (#[id number]) Colonoscope was                            introduced through the anus and advanced to  the the                            cecum, identified by appendiceal orifice and                            ileocecal valve. The colonoscopy was performed                            without difficulty. The patient tolerated the                            procedure well. The quality of the bowel                            preparation was good. The ileocecal valve,                            appendiceal orifice, and rectum were photographed. Scope In: 12:24:47 PM Scope Out: 12:57:30 PM Scope Withdrawal Time: 0 hours 19 minutes 50 seconds  Total Procedure Duration: 0 hours 32 minutes 43 seconds  Findings:                 The perianal and digital rectal examinations were                            normal.                           Two flat polyps were found in the cecum. The polyps                            were 5 to 15 mm in size. These polyps were removed                            with a cold snare. Resection and retrieval were                            complete.  A 25 to 30 mm polyp was found in the proximal                            transverse colon. The polyp was flat. The polyp was                            removed with a piecemeal technique using a cold                            snare. Resection and retrieval were complete. Area                            distal to the polyp was tattooed with an injection                            of Spot (carbon black).                           Three flat and sessile polyps were found in the                            transverse colon. The polyps were 3 to 6 mm in                            size. These polyps were removed with a cold snare.                            Resection and retrieval were complete.                           Three flat polyps were found in the sigmoid colon.                            The polyps were 4 to 10 mm in size. These polyps                            were removed with a cold snare.  Resection and                            retrieval were complete.                           A few small-mouthed diverticula were found in the                            sigmoid colon.                           Internal hemorrhoids were found during retroflexion.                           The exam was otherwise without abnormality. Complications:  No immediate complications. Estimated blood loss:                            Minimal. Estimated Blood Loss:     Estimated blood loss was minimal. Impression:               - Two 5 to 15 mm polyps in the cecum, removed with                            a cold snare. Resected and retrieved.                           - One 25 to 30 mm polyp in the proximal transverse                            colon, removed piecemeal using a cold snare.                            Resected and retrieved. Tattooed.                           - Three 3 to 6 mm polyps in the transverse colon,                            removed with a cold snare. Resected and retrieved.                           - Three 4 to 10 mm polyps in the sigmoid colon,                            removed with a cold snare. Resected and retrieved.                           - Diverticulosis in the sigmoid colon.                           - Internal hemorrhoids.                           - The examination was otherwise normal. Recommendation:           - Patient has a contact number available for                            emergencies. The signs and symptoms of potential                            delayed complications were discussed with the                            patient. Return to normal activities tomorrow.                            Written discharge instructions were provided to the  patient.                           - Resume previous diet.                           - Continue present medications.                           - Await pathology  results. Remo Lipps P. Janet Lovvorn, MD 07/01/2021 1:05:02 PM This report has been signed electronically.

## 2021-07-01 NOTE — Progress Notes (Signed)
Called to room to assist during endoscopic procedure.  Patient ID and intended procedure confirmed with present staff. Received instructions for my participation in the procedure from the performing physician.  

## 2021-07-01 NOTE — Patient Instructions (Signed)
Impression/Recommendations:  Polyp, diverticulosis, and hemorrhoid handouts given to patient.  Resume previous diet. Continue present medications. Await pathology results.  YOU HAD AN ENDOSCOPIC PROCEDURE TODAY AT [facility]:   Refer to the procedure report that was given to you for any specific questions about what was found during the examination.  If the procedure report does not answer your questions, please call your gastroenterologist to clarify.  If you requested that your care partner not be given the details of your procedure findings, then the procedure report has been included in a sealed envelope for you to review at your convenience later.  YOU SHOULD EXPECT: Some feelings of bloating in the abdomen. Passage of more gas than usual.  Walking can help get rid of the air that was put into your GI tract during the procedure and reduce the bloating. If you had a lower endoscopy (such as a colonoscopy or flexible sigmoidoscopy) you may notice spotting of blood in your stool or on the toilet paper. If you underwent a bowel prep for your procedure, you may not have a normal bowel movement for a few days.  Please Note:  You might notice some irritation and congestion in your nose or some drainage.  This is from the oxygen used during your procedure.  There is no need for concern and it should clear up in a day or so.  SYMPTOMS TO REPORT IMMEDIATELY:  Following lower endoscopy (colonoscopy or flexible sigmoidoscopy):  Excessive amounts of blood in the stool  Significant tenderness or worsening of abdominal pains  Swelling of the abdomen that is new, acute  Fever of 100F or higher For urgent or emergent issues, a gastroenterologist can be reached at any hour by calling [phone]. Do not use MyChart messaging for urgent concerns.    DIET:  We do recommend a small meal at first, but then you may proceed to your regular diet.  Drink plenty of fluids but you should  avoid alcoholic beverages for 24 hours.  ACTIVITY:  You should plan to take it easy for the rest of today and you should NOT DRIVE or use heavy machinery until tomorrow (because of the sedation medicines used during the test).    FOLLOW UP: Our staff will call the number listed on your records 48-72 hours following your procedure to check on you and address any questions or concerns that you may have regarding the information given to you following your procedure. If we do not reach you, we will leave a message.  We will attempt to reach you two times.  During this call, we will ask if you have developed any symptoms of COVID 19. If you develop any symptoms (ie: fever, flu-like symptoms, shortness of breath, cough etc.) before then, please call [phone].  If you test positive for Covid 19 in the 2 weeks post procedure, please call and report this information to [place].    If any biopsies were taken you will be contacted by phone or by letter within the next 1-3 weeks.  Please call us at [phone] if you have not heard about the biopsies in 3 weeks.    SIGNATURES/CONFIDENTIALITY: You and/or your care partner have signed paperwork which will be entered into your electronic medical record.  These signatures attest to the fact that that the information above on your After Visit Summary has been reviewed and is understood.  Full responsibility of the confidentiality of this discharge information lies with you and/or your care-partner.

## 2021-07-01 NOTE — Progress Notes (Signed)
VS completed by DT.  Pt's states no medical or surgical changes since previsit or office visit.  

## 2021-07-01 NOTE — Progress Notes (Signed)
[facility] History and Physical   Primary Care Physician:  Burnis Medin, MD   Reason for Procedure:   CRC screening  Plan:    colonoscopy     HPI: Janet Brock is a 65 y.o. female  here for colonoscopy screening. Last exam 2010 reportedly normal. Patient denies any bowel symptoms at this time. No family history of colon cancer known. Otherwise feels well without any cardiopulmonary symptoms.    Past Medical History:  Diagnosis Date   Anemia    past hx - off Iron   History of varicella    Hypothyroidism    synthroid  ? hyperthyroid when young and thin and then  dx hypo dx at clinic at work    Migraines    pre menopausal  hx of vicodin  rescue     Past Surgical History:  Procedure Laterality Date   ABLATION     ? year    btl     COLONOSCOPY     approx 2 years ago   HYSTEROSCOPY  05/08/2011   Procedure: HYSTEROSCOPY WITH HYDROTHERMAL ABLATION;  Surgeon: Olga Millers;  Location: [facility];  Service: Gynecology;  Laterality: N/A;   svd       x 2   WISDOM TOOTH EXTRACTION      Prior to Admission medications   Medication Sig Start Date End Date Taking? Authorizing Provider  ASPIRIN 81 PO Take 1 tablet by mouth daily. Three x a week   Yes [provider]  levothyroxine (SYNTHROID) 100 MCG tablet TAKE 1 TABLET BY MOUTH EVERY DAY 01/14/21  Yes Panosh, Standley Brooking, MD    Current Outpatient Medications  Medication Sig Dispense Refill   ASPIRIN 81 PO Take 1 tablet by mouth daily. Three x a week     levothyroxine (SYNTHROID) 100 MCG tablet TAKE 1 TABLET BY MOUTH EVERY DAY 30 tablet 0   Current Facility-Administered Medications  Medication Dose Route Frequency Provider Last Rate Last Admin   0.9 %  sodium chloride infusion  500 mL Intravenous Once Jon Lall, Carlota Raspberry, MD        Allergies as of 07/01/2021 - Review Complete 07/01/2021  Allergen Reaction Noted   Sumatriptan Shortness Of Breath and Other (See Comments) 06/22/2007   Amoxicillin-pot  clavulanate Other (See Comments) 07/11/2019    Family History  Problem Relation Age of Onset   Leukemia Mother    Cancer Mother 25       Breast Cancer   Breast cancer Mother    Alcohol abuse Father    Heart disease Father    Dementia Father    Lymphoma Sister    Thyroid disease Sister    Cancer Maternal Aunt        Breast, Skin   Leukemia Maternal Aunt    Breast cancer Maternal Aunt    Colon cancer Neg Hx    Colon polyps Neg Hx    Esophageal cancer Neg Hx    Rectal cancer Neg Hx    Stomach cancer Neg Hx     Social History   Socioeconomic History   Marital status: Single    Spouse name: Not on file   Number of children: Not on file   Years of education: Not on file   Highest education level: Not on file  Occupational History   Not on file  Tobacco Use   Smoking status: Never   Smokeless tobacco: Never  Vaping Use   Vaping Use: Never used  Substance and  Sexual Activity   Alcohol use: Yes    Alcohol/week: 2.0 standard drinks    Types: 2 Glasses of wine per week   Drug use: No   Sexual activity: Yes    Partners: Male    Birth control/protection: Surgical  Other Topics Concern   Not on file  Social History Narrative   Receives 7 hours of sleep per night   Lives at home with her partner and sometimes her youngest son     Has 10 dogs (has show dogs), 1 cat and 5 goats   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    From [place] has lived in [place] and [place].   Mom lives in [place] father lives in [place].   g3 p3   Father passed 2016  alzhiemer prostate cancer age 58   Mom 20 generally well   Social Determinants of Radio broadcast assistant Strain: Not on file  Food Insecurity: Not on file  Transportation Needs: Not on file  Physical Activity: Not on file  Stress: Not on file  Social Connections: Not on file  Intimate Partner Violence: Not on file    Review of Systems: All other review of  systems negative except as mentioned in the HPI.  Physical Exam: Vital signs BP 125/64   Pulse 63   Temp 97.8 F (36.6 C) (Temporal)   Resp 13   Ht 5' 5.5" (1.664 m)   Wt 140 lb (63.5 kg)   LMP 12/21/2013   SpO2 98%   BMI 22.94 kg/m   General:   Alert,  Well-developed, pleasant and cooperative in NAD Lungs:  Clear throughout to auscultation.   Heart:  Regular rate and rhythm Abdomen:  Soft, nontender and nondistended.   Neuro/Psych:  Alert and cooperative. Normal mood and affect. A and O x 3  Jolly Mango, MD [facility]

## 2021-07-03 ENCOUNTER — Telehealth: Payer: Self-pay

## 2021-07-03 NOTE — Telephone Encounter (Signed)
  Follow up Call-  Call back number 07/01/2021  Post procedure Call Back phone  # [phone]  Permission to leave phone message Yes  Some recent data might be hidden     Patient questions:  Do you have a fever, pain , or abdominal swelling? No. Pain Score  0 *  Have you tolerated food without any problems? Yes.    Have you been able to return to your normal activities? Yes.    Do you have any questions about your discharge instructions: Diet   Yes.   Medications  Yes.   Follow up visit  No.  Do you have questions or concerns about your Care? No.  Actions: * If pain score is 4 or above: No action needed, pain <4.  Have you developed a fever since your procedure? no  2.   Have you had an respiratory symptoms (SOB or cough) since your procedure? no  3.   Have you tested positive for COVID 19 since your procedure no  4.   Have you had any family members/close contacts diagnosed with the COVID 19 since your procedure?  no   If yes to any of these questions please route to Joylene John, RN and Joella Prince, RN

## 2021-07-04 ENCOUNTER — Encounter: Payer: Self-pay | Admitting: Gastroenterology

## 2021-07-11 DIAGNOSIS — H2513 Age-related nuclear cataract, bilateral: Secondary | ICD-10-CM | POA: Diagnosis not present

## 2021-09-10 DIAGNOSIS — H2513 Age-related nuclear cataract, bilateral: Secondary | ICD-10-CM | POA: Diagnosis not present

## 2021-09-10 DIAGNOSIS — H25013 Cortical age-related cataract, bilateral: Secondary | ICD-10-CM | POA: Diagnosis not present

## 2021-09-10 DIAGNOSIS — H18413 Arcus senilis, bilateral: Secondary | ICD-10-CM | POA: Diagnosis not present

## 2021-09-10 DIAGNOSIS — H25043 Posterior subcapsular polar age-related cataract, bilateral: Secondary | ICD-10-CM | POA: Diagnosis not present

## 2021-10-04 ENCOUNTER — Other Ambulatory Visit: Payer: Self-pay

## 2021-10-04 ENCOUNTER — Ambulatory Visit
Admission: RE | Admit: 2021-10-04 | Discharge: 2021-10-04 | Disposition: A | Discharge status: Home / Self Care | Payer: Medicare HMO | Source: Ambulatory Visit | Attending: Obstetrics and Gynecology | Admitting: Obstetrics and Gynecology

## 2021-10-04 DIAGNOSIS — Z780 Asymptomatic menopausal state: Secondary | ICD-10-CM | POA: Diagnosis not present

## 2021-10-04 DIAGNOSIS — Z13820 Encounter for screening for osteoporosis: Secondary | ICD-10-CM

## 2021-10-09 ENCOUNTER — Telehealth: Payer: Self-pay | Admitting: Internal Medicine

## 2021-10-09 NOTE — Telephone Encounter (Signed)
Patient called to check and see when she last had her COVID booster and to see which shots she received during her last appointment. Informed patient that she had flu and pneumococcal on 9/14 of last year.   Patient would like to know if she is needing any boosters for shingles, another COVID booster and any other vaccinations. She just wants to make sure they are all up to date.  Patient would like a call at [phone].  Please advise.

## 2021-10-30 NOTE — Telephone Encounter (Signed)
Not sure what to do with this message  She can get the Shingrix vaccine at her pharmacy if wishes has not had it. She has had 3 COVID vaccines And it is up to her whether she wants a booster. Her only risk factor is age.

## 2021-10-31 ENCOUNTER — Telehealth (INDEPENDENT_AMBULATORY_CARE_PROVIDER_SITE_OTHER): Payer: Medicare HMO | Admitting: Internal Medicine

## 2021-10-31 ENCOUNTER — Encounter: Payer: Self-pay | Admitting: Internal Medicine

## 2021-10-31 VITALS — Wt 142.0 lb

## 2021-10-31 DIAGNOSIS — Z7185 Encounter for immunization safety counseling: Secondary | ICD-10-CM

## 2021-10-31 DIAGNOSIS — Z7189 Other specified counseling: Secondary | ICD-10-CM | POA: Diagnosis not present

## 2021-10-31 NOTE — Telephone Encounter (Signed)
Pt informed of the results and verbalized understanding. Pt has been scheduled for Virtual visit today.

## 2021-10-31 NOTE — Progress Notes (Signed)
° °  Virtual Visit via Telephone Note  I connected wit on h Janet Brock 10/31/21 at 10:30 AM EST by telephone and verified that I am speaking with the correct person using two identifiers.   I discussed the limitations, risks, security and privacy concerns of performing an evaluation and management service by telephone and the limited availability of in person appointments. tThere may be a patient responsible charge related to this service. The patient expressed understanding and agreed to proceed.  Location patient: home Location provider:home office Participants present for the call: patient, provider Patient did not have a visit in the prior 7 days to address this/these issue(s).   History of Present Illness: Janet Brock presents to discuss her immunization status.  See previous notes. She has had 3 COVID vaccines the second [place] she felt quite sick but was better after a day.  Otherwise no side effects last 1 was November 2021 No confirmed diagnosis of COVID infection although in January 2020 suspicious symptoms. She is healthy and her COVID complication risk is 1. Her friend and his family have had COVID and some deaths in the past during the original and delta waves. Asks about the recent by valent booster and advisability.  As well as shingles vaccine she had the original Zostavax in the past but it is not in her record immunization report.   Observations/Objective: Patient sounds personable and well on the phone. I do not appreciate any SOB. Speech and thought processing are grossly intact. Patient reported vitals:   Assessment and Plan: Vaccine counseling  Counseled about COVID-19 virus infection   Follow Up Instructions: She is low risk only risk is age I will leave it up to her but at this point in time I do not see good enough evidence risk-benefit for this latest booster.  However observation if things change or the prevalence strain becomes more potent she  may decide to change habits.  Would proceed with the Shingrix side effect risk aware. Continue healthy lifestyle. Discussed known and unknown facts at this point. Get back with [place] if needs further information be aware of new information as it comes out  Contact us if covid infection consider antivirals   [id number] [id number] [id number] 11-20 [id number] 21-30 I did not refer this patient for an OV in the next 24 hours for this/these issue(s).  I discussed the assessment and treatment plan with the patient. The patient was provided an opportunity to ask questions and answered. The patient agreed with the plan and demonstrated an understanding of the instructions.   The patient was advised to call back or seek an in-person evaluation if the symptoms worsen or if the condition fails to improve as anticipated.  I provided 22 minutes of non-face-to-face time during this encounter. Return for when  due or as indicated .  Janet Ace, MD

## 2021-11-26 ENCOUNTER — Ambulatory Visit (INDEPENDENT_AMBULATORY_CARE_PROVIDER_SITE_OTHER): Payer: Medicare HMO | Admitting: Internal Medicine

## 2021-11-26 ENCOUNTER — Encounter: Payer: Self-pay | Admitting: Internal Medicine

## 2021-11-26 VITALS — BP 120/74 | HR 64 | Temp 98.7°F | Ht 65.5 in | Wt 146.2 lb

## 2021-11-26 DIAGNOSIS — E038 Other specified hypothyroidism: Secondary | ICD-10-CM | POA: Diagnosis not present

## 2021-11-26 DIAGNOSIS — R42 Dizziness and giddiness: Secondary | ICD-10-CM

## 2021-11-26 DIAGNOSIS — Z79899 Other long term (current) drug therapy: Secondary | ICD-10-CM

## 2021-11-26 LAB — BASIC METABOLIC PANEL
BUN: 20 mg/dL (ref 6–23)
CO2: 27 mEq/L (ref 19–32)
Calcium: 9.4 mg/dL (ref 8.4–10.5)
Chloride: 105 mEq/L (ref 96–112)
Creatinine, Ser: 0.73 mg/dL (ref 0.40–1.20)
GFR: 85.95 mL/min (ref >60.00)
Glucose, Bld: 80 mg/dL (ref 70–99)
Potassium: 4.7 mEq/L (ref 3.5–5.1)
Sodium: 140 mEq/L (ref 135–145)

## 2021-11-26 LAB — CBC WITH DIFFERENTIAL/PLATELET
Basophils Absolute: 0.1 10*3/uL (ref 0.0–0.1)
Basophils Relative: 1 % (ref 0.0–3.0)
Eosinophils Absolute: 0.1 10*3/uL (ref 0.0–0.7)
Eosinophils Relative: 1.4 % (ref 0.0–5.0)
HCT: 42.5 % (ref 36.0–46.0)
Hemoglobin: 14.3 g/dL (ref 12.0–15.0)
Lymphocytes Relative: 38 % (ref 12.0–46.0)
Lymphs Abs: 2.4 10*3/uL (ref 0.7–4.0)
MCHC: 33.5 g/dL (ref 30.0–36.0)
MCV: 92.6 fl (ref 78.0–100.0)
Monocytes Absolute: 0.5 10*3/uL (ref 0.1–1.0)
Monocytes Relative: 7.6 % (ref 3.0–12.0)
Neutro Abs: 3.3 10*3/uL (ref 1.4–7.7)
Neutrophils Relative %: 52 % (ref 43.0–77.0)
Platelets: 331 10*3/uL (ref 150.0–400.0)
RBC: 4.59 Mil/uL (ref 3.87–5.11)
RDW: 14.1 % (ref 11.5–15.5)
WBC: 6.3 10*3/uL (ref 4.0–10.5)

## 2021-11-26 LAB — TSH: TSH: 0.57 u[IU]/mL (ref 0.35–5.50)

## 2021-11-26 NOTE — Progress Notes (Signed)
? ?Chief Complaint  ?Patient presents with  ? Dizziness  ? ? ?HPI: ?KIASHA BELLIN 66 y.o. come in for new problem  off and on for  few weeks  ? ?Sat  night  3 days ago did show  and when got up after eating minimally  felt  light headed.   So  ate more and did better .  Was ok for 2 days  then   in am going down stairs felt light headed.  Not spinning.    Sit on bed then laid down.    Stayed home yesterday .  Ate lunch   and dinner .  ? ?Caffine use is reg  ;occ etoh.  ?Not associated with times .  ? If getting up .    Leaning back?  Doesn last long  ? ?Has cataract surgery scheduled.  If this month. ?ROS: See pertinent positives and negatives per HPI.  No chest pain shortness of breath palpitation racing heart syncope major change in hearing or vision neurologic signs has had sinus headaches occasionally this is not new for which she takes a sinus medicine no fevers. ? ?Past Medical History:  ?Diagnosis Date  ? Anemia   ? past hx - off Iron  ? History of varicella   ? Hypothyroidism   ? synthroid  ? hyperthyroid when young and thin and then  dx hypo dx at clinic at work   ? Migraines   ? pre menopausal  hx of vicodin  rescue   ? ? ?Family History  ?Problem Relation Age of Onset  ? Leukemia Mother   ? Cancer Mother 47  ?     Breast Cancer  ? Breast cancer Mother   ? Alcohol abuse Father   ? Heart disease Father   ? Dementia Father   ? Lymphoma Sister   ? Thyroid disease Sister   ? Cancer Maternal Aunt   ?     Breast, Skin  ? Leukemia Maternal Aunt   ? Breast cancer Maternal Aunt   ? Colon cancer Neg Hx   ? Colon polyps Neg Hx   ? Esophageal cancer Neg Hx   ? Rectal cancer Neg Hx   ? Stomach cancer Neg Hx   ? ? ?Social History  ? ?Socioeconomic History  ? Marital status: Single  ?  Spouse name: Not on file  ? Number of children: Not on file  ? Years of education: Not on file  ? Highest education level: Not on file  ?Occupational History  ? Not on file  ?Tobacco Use  ? Smoking status: Never  ? Smokeless tobacco:  Never  ?Vaping Use  ? Vaping Use: Never used  ?Substance and Sexual Activity  ? Alcohol use: Yes  ?  Alcohol/week: 2.0 standard drinks  ?  Types: 2 Glasses of wine per week  ? Drug use: No  ? Sexual activity: Yes  ?  Partners: Male  ?  Birth control/protection: Surgical  ?Other Topics Concern  ? Not on file  ?Social History Narrative  ? Receives 7 hours of sleep per night  ? Lives at home with her partner and sometimes her youngest son    ? Has 10 dogs (has show dogs), 1 cat and 5 goats  ? 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 [place] [employer] work.   ? From [place] has lived in [place] and [place].  ? Mom  lives in [place] father lives in [place].  ? g3 p3  ? Father passed 2016  alzhiemer prostate cancer age 7  ? Mom 92 generally well  ? ?Social Determinants of Health  ? ?Financial Resource Strain: Not on file  ?Food Insecurity: Not on file  ?Transportation Needs: Not on file  ?Physical Activity: Not on file  ?Stress: Not on file  ?Social Connections: Not on file  ? ? ?Outpatient Medications Prior to Visit  ?Medication Sig Dispense Refill  ? ASPIRIN 81 PO Take 1 tablet by mouth daily. Three x a week    ? levothyroxine (SYNTHROID) 100 MCG tablet TAKE 1 TABLET BY MOUTH EVERY DAY 30 tablet 0  ? ?No facility-administered medications prior to visit.  ? ? ? ?EXAM: ? ?BP 120/74 (BP Location: Left Arm, Patient Position: Sitting, Cuff Size: Normal)   Pulse 64   Temp 98.7 ?F (37.1 ?C) (Oral)   Ht 5' 5.5" (1.664 m)   Wt 146 lb 3.2 oz (66.3 kg)   LMP 12/21/2013   SpO2 99%   BMI 23.96 kg/m?  ? ?Body mass index is 23.96 kg/m?. ?Weight: 146 lb 3.2 oz (66.3 kg)  ?Wt Readings from Last 3 Encounters:  ?11/26/21 146 lb 3.2 oz (66.3 kg)  ?10/31/21 142 lb (64.4 kg)  ?07/01/21 140 lb (63.5 kg)  ? ? ?GENERAL: vitals reviewed and listed above, alert, oriented, appears well hydrated and in no acute distress ?HEENT: atraumatic, conjunctiva  clear, no obvious abnormalities on inspection of  external nose and ears EOMs full TMs appear clear face is nontender OP : n masked ?NECK: no obvious masses on inspection palpation no bruits are heard ?LUNGS: clear to auscultation bilaterally, no wheezes, rales or rhonchi, good air movement ?CV: HRRR, no clubbing cyanosis or  peripheral edema nl cap refill  ?Abdomen soft without again a megaly guarding or rebound ?MS: moves all extremities without noticeable focal  abnormality ?Neurologic cranial nerves III through XII are grossly intact gait is normal negative Romberg normal heel-to-toe normal muscle strength no tremor finger-to-nose normal ?PSYCH: pleasant and cooperative, no obvious depression or anxiety ?Lab Results  ?Component Value Date  ? WBC 9.2 06/05/2021  ? HGB 14.1 06/05/2021  ? HCT 42.1 06/05/2021  ? PLT 331.0 06/05/2021  ? GLUCOSE 86 06/05/2021  ? CHOL 199 06/05/2021  ? TRIG 55.0 06/05/2021  ? HDL 76.80 06/05/2021  ? LDLCALC 111 (H) 06/05/2021  ? ALT 18 06/05/2021  ? AST 16 06/05/2021  ? NA 139 06/05/2021  ? K 4.2 06/05/2021  ? CL 103 06/05/2021  ? CREATININE 0.89 06/05/2021  ? BUN 19 06/05/2021  ? CO2 30 06/05/2021  ? TSH 1.40 06/05/2021  ? HGBA1C 5.5 06/05/2021  ? MICROALBUR <0.7 06/14/2021  ? ?BP Readings from Last 3 Encounters:  ?11/26/21 120/74  ?07/01/21 100/67  ?06/05/21 104/60  ? ? ?ASSESSMENT AND PLAN: ? ?Discussed the following assessment and plan: ? ?Episodes  of dizziness - Plan: Basic metabolic panel, CBC with Differential/Platelet, TSH, TSH, CBC with Differential/Platelet, Basic metabolic panel ? ?Medication management - Plan: Basic metabolic panel, CBC with Differential/Platelet, TSH, TSH, CBC with Differential/Platelet, Basic metabolic panel ? ?Other specified hypothyroidism - Plan: Basic metabolic panel, CBC with Differential/Platelet, TSH, TSH, CBC with Differential/Platelet, Basic metabolic panel ?? Positional dizziness ?  Not true vertigo and transient but appears to be related to rising or leaning back.  Consider inner ear or  allergy sinus but no real stuffiness. ?? Consider trying  sinus meds  or flonase .  No evidence of cardiovascular cause ?We will update labs stay hydrated we will write a note to be able to work from home for the next week.  Then go from there. ?She has not had any major event while driving but would have a concern if got worse. ? ?-Patient advised to return or notify health care team  if  new concerns arise. ? ?Patient Instructions  ?Exam is reassuring.  ? ?Sounds like a mild version of positional   dizziness ( allergy  inner ear  vs other)   . ? ?Stay  hydrated  and monitor BP pulse  ?Let us know   results,.  And will go from there .    ?Plan fu if  persistent or progressive . ?Standley Brooking. Delorse Shane M.D. ?

## 2021-11-26 NOTE — Patient Instructions (Addendum)
Exam is reassuring.  ? ?Sounds like a mild version of positional   dizziness ( allergy  inner ear  vs other)   . ? ?Stay  hydrated  and monitor BP pulse  ?Let us know   results,.  And will go from there .    ?Plan fu if  persistent or progressive . ? ?

## 2021-11-26 NOTE — Progress Notes (Signed)
Thyroid blood count and chemistry are in the normal ranges ? no explanation for your symptoms.  Contact us about how doing next week.

## 2021-11-27 NOTE — Progress Notes (Signed)
do not take with food :  can take at least 1 hour before eating   or  2 hours or later after eating . It wont harm her to take with food but  food may block consistent  absorption  in to blood stream. IF you forget take it anyway  . Better than missing a dose.

## 2021-12-06 DIAGNOSIS — H2513 Age-related nuclear cataract, bilateral: Secondary | ICD-10-CM | POA: Diagnosis not present

## 2021-12-06 DIAGNOSIS — H2512 Age-related nuclear cataract, left eye: Secondary | ICD-10-CM | POA: Diagnosis not present

## 2021-12-06 DIAGNOSIS — H2511 Age-related nuclear cataract, right eye: Secondary | ICD-10-CM | POA: Diagnosis not present

## 2021-12-20 DIAGNOSIS — H2513 Age-related nuclear cataract, bilateral: Secondary | ICD-10-CM | POA: Diagnosis not present

## 2021-12-20 DIAGNOSIS — H2511 Age-related nuclear cataract, right eye: Secondary | ICD-10-CM | POA: Diagnosis not present

## 2021-12-25 ENCOUNTER — Telehealth: Payer: Self-pay | Admitting: Internal Medicine

## 2021-12-25 NOTE — Telephone Encounter (Signed)
Left message for patient to call back and schedule Medicare Annual Wellness Visit (AWV) either virtually or in office. Left  my jabber number [phone]   awvi 11/20/21 per [facility]  please schedule at anytime with [facility] Nurse Health Advisor 1 or 2   This should be a 45 minute visit.  

## 2022-01-06 ENCOUNTER — Ambulatory Visit (INDEPENDENT_AMBULATORY_CARE_PROVIDER_SITE_OTHER): Payer: Medicare HMO

## 2022-01-06 VITALS — Ht 66.0 in | Wt 142.0 lb

## 2022-01-06 DIAGNOSIS — Z0000 Encounter for general adult medical examination without abnormal findings: Secondary | ICD-10-CM

## 2022-01-06 NOTE — Patient Instructions (Signed)
Ms. Digiulio , ?Thank you for taking time to come for your Medicare Wellness Visit. I appreciate your ongoing commitment to your health goals. Please review the following plan we discussed and let me know if I can assist you in the future.  ? ?Screening recommendations/referrals: ?Colonoscopy: completed 07/01/2021 ?Mammogram: completed 06/25/2021, due 06/26/2022 ?Bone Density: completed 10/04/2021 ?Recommended yearly ophthalmology/optometry visit for glaucoma screening and checkup ?Recommended yearly dental visit for hygiene and checkup ? ?Vaccinations: ?Influenza vaccine: due 04/22/2022 ?Pneumococcal vaccine: completed 06/05/2021 ?Tdap vaccine: completed 06/06/2014, due 06/06/2024 ?Shingles vaccine: needs second dose   ?Covid-19: 07/25/2020, 12/14/2019, 11/23/2019 ? ?Advanced directives: Advance directive discussed with you today. . ? ?Conditions/risks identified: none ? ?Next appointment: Follow up in one year for your annual wellness visit  ? ? ?Preventive Care 27 Years and Older, Female ?Preventive care refers to lifestyle choices and visits with your health care provider that can promote health and wellness. ?What does preventive care include? ?A yearly physical exam. This is also called an annual well check. ?Dental exams once or twice a year. ?Routine eye exams. Ask your health care provider how often you should have your eyes checked. ?Personal lifestyle choices, including: ?Daily care of your teeth and gums. ?Regular physical activity. ?Eating a healthy diet. ?Avoiding tobacco and drug use. ?Limiting alcohol use. ?Practicing safe sex. ?Taking low-dose aspirin every day. ?Taking vitamin and mineral supplements as recommended by your health care provider. ?What happens during an annual well check? ?The services and screenings done by your health care provider during your annual well check will depend on your age, overall health, lifestyle risk factors, and family history of disease. ?Counseling  ?Your health care  provider may ask you questions about your: ?Alcohol use. ?Tobacco use. ?Drug use. ?Emotional well-being. ?Home and relationship well-being. ?Sexual activity. ?Eating habits. ?History of falls. ?Memory and ability to understand (cognition). ?Work and work Statistician. ?Reproductive health. ?Screening  ?You may have the following tests or measurements: ?Height, weight, and BMI. ?Blood pressure. ?Lipid and cholesterol levels. These may be checked every 5 years, or more frequently if you are over 62 years old. ?Skin check. ?Lung cancer screening. You may have this screening every year starting at age 1 if you have a 30-pack-year history of smoking and currently smoke or have quit within the past 15 years. ?Fecal occult blood test (FOBT) of the stool. You may have this test every year starting at age 38. ?Flexible sigmoidoscopy or colonoscopy. You may have a sigmoidoscopy every 5 years or a colonoscopy every 10 years starting at age 82. ?Hepatitis C blood test. ?Hepatitis B blood test. ?Sexually transmitted disease (STD) testing. ?Diabetes screening. This is done by checking your blood sugar (glucose) after you have not eaten for a while (fasting). You may have this done every 1-3 years. ?Bone density scan. This is done to screen for osteoporosis. You may have this done starting at age 72. ?Mammogram. This may be done every 1-2 years. Talk to your health care provider about how often you should have regular mammograms. ?Talk with your health care provider about your test results, treatment options, and if necessary, the need for more tests. ?Vaccines  ?Your health care provider may recommend certain vaccines, such as: ?Influenza vaccine. This is recommended every year. ?Tetanus, diphtheria, and acellular pertussis (Tdap, Td) vaccine. You may need a Td booster every 10 years. ?Zoster vaccine. You may need this after age 37. ?Pneumococcal 13-valent conjugate (PCV13) vaccine. One dose is recommended after age  49. ?Pneumococcal  polysaccharide (PPSV23) vaccine. One dose is recommended after age 33. ?Talk to your health care provider about which screenings and vaccines you need and how often you need them. ?This information is not intended to replace advice given to you by your health care provider. Make sure you discuss any questions you have with your health care provider. ?Document Released: 10/05/2015 Document Revised: 05/28/2016 Document Reviewed: 07/10/2015 ?Elsevier Interactive Patient Education ? 2017 [place]. ? ?Fall Prevention in the Home ?Falls can cause injuries. They can happen to people of all ages. There are many things you can do to make your home safe and to help prevent falls. ?What can I do on the outside of my home? ?Regularly fix the edges of walkways and driveways and fix any cracks. ?Remove anything that might make you trip as you walk through a door, such as a raised step or threshold. ?Trim any bushes or trees on the path to your home. ?Use bright outdoor lighting. ?Clear any walking paths of anything that might make someone trip, such as rocks or tools. ?Regularly check to see if handrails are loose or broken. Make sure that both sides of any steps have handrails. ?Any raised decks and porches should have guardrails on the edges. ?Have any leaves, snow, or ice cleared regularly. ?Use sand or salt on walking paths during winter. ?Clean up any spills in your garage right away. This includes oil or grease spills. ?What can I do in the bathroom? ?Use night lights. ?Install grab bars by the toilet and in the tub and shower. Do not use towel bars as grab bars. ?Use non-skid mats or decals in the tub or shower. ?If you need to sit down in the shower, use a plastic, non-slip stool. ?Keep the floor dry. Clean up any water that spills on the floor as soon as it happens. ?Remove soap buildup in the tub or shower regularly. ?Attach bath mats securely with double-sided non-slip rug tape. ?Do not have throw  rugs and other things on the floor that can make you trip. ?What can I do in the bedroom? ?Use night lights. ?Make sure that you have a light by your bed that is easy to reach. ?Do not use any sheets or blankets that are too big for your bed. They should not hang down onto the floor. ?Have a firm chair that has side arms. You can use this for support while you get dressed. ?Do not have throw rugs and other things on the floor that can make you trip. ?What can I do in the kitchen? ?Clean up any spills right away. ?Avoid walking on wet floors. ?Keep items that you use a lot in easy-to-reach places. ?If you need to reach something above you, use a strong step stool that has a grab bar. ?Keep electrical cords out of the way. ?Do not use floor polish or wax that makes floors slippery. If you must use wax, use non-skid floor wax. ?Do not have throw rugs and other things on the floor that can make you trip. ?What can I do with my stairs? ?Do not leave any items on the stairs. ?Make sure that there are handrails on both sides of the stairs and use them. Fix handrails that are broken or loose. Make sure that handrails are as long as the stairways. ?Check any carpeting to make sure that it is firmly attached to the stairs. Fix any carpet that is loose or worn. ?Avoid having throw rugs at the top or  bottom of the stairs. If you do have throw rugs, attach them to the floor with carpet tape. ?Make sure that you have a light switch at the top of the stairs and the bottom of the stairs. If you do not have them, ask someone to add them for you. ?What else can I do to help prevent falls? ?Wear shoes that: ?Do not have high heels. ?Have rubber bottoms. ?Are comfortable and fit you well. ?Are closed at the toe. Do not wear sandals. ?If you use a stepladder: ?Make sure that it is fully opened. Do not climb a closed stepladder. ?Make sure that both sides of the stepladder are locked into place. ?Ask someone to hold it for you, if  possible. ?Clearly mark and make sure that you can see: ?Any grab bars or handrails. ?First and last steps. ?Where the edge of each step is. ?Use tools that help you move around (mobility aids) if they are needed. These incl

## 2022-01-06 NOTE — Progress Notes (Signed)
?I connected with Jamacia Jester today by telephone and verified that I am speaking with the correct person using two identifiers. ?Location patient: home ?Location provider: work ?Persons participating in the virtual visit: Deandra, Goering LPN. ?  ?I discussed the limitations, risks, security and privacy concerns of performing an evaluation and management service by telephone and the availability of in person appointments. I also discussed with the patient that there may be a patient responsible charge related to this service. The patient expressed understanding and verbally consented to this telephonic visit.  ?  ?Interactive audio and video telecommunications were attempted between this provider and patient, however failed, due to patient having technical difficulties OR patient did not have access to video capability.  We continued and completed visit with audio only. ? ?  ? ?Vital signs may be patient reported or missing. ? ?Subjective:  ? Janet Brock is a 66 y.o. female who presents for an Initial Medicare Annual Wellness Visit. ? ?Review of Systems    ? ?Cardiac Risk Factors include: advanced age (>64mn, >>3women) ? ?   ?Objective:  ?  ?Today's Vitals  ? 01/06/22 1259  ?Weight: 142 lb (64.4 kg)  ?Height: '5\' 6"'$  (1.676 m)  ? ?Body mass index is 22.92 kg/m?. ? ? ?  01/06/2022  ?  1:06 PM 11/14/2016  ? 10:07 AM 05/08/2011  ? 10:26 AM 04/30/2011  ? 10:40 AM  ?Advanced Directives  ?Does Patient Have a Medical Advance Directive? No No  Patient does not have advance directive  ?Pre-existing out of facility DNR order (yellow form or pink MOST form)   No   ? ? ?Current Medications (verified) ?Outpatient Encounter Medications as of 01/06/2022  ?Medication Sig  ? ASPIRIN 81 PO Take 1 tablet by mouth daily. Three x a week  ? levothyroxine (SYNTHROID) 100 MCG tablet TAKE 1 TABLET BY MOUTH EVERY DAY  ? ?No facility-administered encounter medications on file as of 01/06/2022.  ? ? ?Allergies  (verified) ?Sumatriptan and Amoxicillin-pot clavulanate  ? ?History: ?Past Medical History:  ?Diagnosis Date  ? Anemia   ? past hx - off Iron  ? History of varicella   ? Hypothyroidism   ? synthroid  ? hyperthyroid when young and thin and then  dx hypo dx at clinic at work   ? Migraines   ? pre menopausal  hx of vicodin  rescue   ? ?Past Surgical History:  ?Procedure Laterality Date  ? ABLATION    ? ? year   ? btl    ? CATARACT EXTRACTION Bilateral   ? 12/06/2021, 12/20/2021  ? COLONOSCOPY    ? approx 2 years ago  ? HYSTEROSCOPY  05/08/2011  ? Procedure: HYSTEROSCOPY WITH HYDROTHERMAL ABLATION;  Surgeon: MOlga Millers  [facility];  Service: Gynecology;  Laterality: N/A;  ? svd     ?  x 2  ? WISDOM TOOTH EXTRACTION    ? ?Family History  ?Problem Relation Age of Onset  ? Leukemia Mother   ? Cancer Mother 869 ?     Breast Cancer  ? Breast cancer Mother   ? Alcohol abuse Father   ? Heart disease Father   ? Dementia Father   ? Lymphoma Sister   ? Thyroid disease Sister   ? Cancer Maternal Aunt   ?     Breast, Skin  ? Leukemia Maternal Aunt   ? Breast cancer Maternal Aunt   ? Colon cancer Neg Hx   ?  Colon polyps Neg Hx   ? Esophageal cancer Neg Hx   ? Rectal cancer Neg Hx   ? Stomach cancer Neg Hx   ? ?Social History  ? ?Socioeconomic History  ? Marital status: Single  ?  Spouse name: Not on file  ? Number of children: Not on file  ? Years of education: Not on file  ? Highest education level: Not on file  ?Occupational History  ? Not on file  ?Tobacco Use  ? Smoking status: Never  ? Smokeless tobacco: Never  ?Vaping Use  ? Vaping Use: Never used  ?Substance and Sexual Activity  ? Alcohol use: Yes  ?  Alcohol/week: 2.0 standard drinks  ?  Types: 2 Glasses of wine per week  ? Drug use: No  ? Sexual activity: Yes  ?  Partners: Male  ?  Birth control/protection: Surgical  ?Other Topics Concern  ? Not on file  ?Social History Narrative  ? Receives 7 hours of sleep per night  ? Lives at home with her partner and  sometimes her youngest son    ? Has 10 dogs (has show dogs), 1 cat and 5 goats  ? 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 [place] work.   ? From [place] has lived in [place] and [place].  ? Mom lives in [place] father lives in [place].  ? g3 p3  ? Father passed 2016  alzhiemer prostate cancer age 27  ? Mom 2 generally well  ? ?Social Determinants of Health  ? ?Financial Resource Strain: Low Risk   ? Difficulty of Paying Living Expenses: Not hard at all  ?Food Insecurity: No Food Insecurity  ? Worried About Charity fundraiser in the Last Year: Never true  ? Ran Out of Food in the Last Year: Never true  ?Transportation Needs: No Transportation Needs  ? Lack of Transportation (Medical): No  ? Lack of Transportation (Non-Medical): No  ?Physical Activity: Insufficiently Active  ? Days of Exercise per Week: 4 days  ? Minutes of Exercise per Session: 20 min  ?Stress: No Stress Concern Present  ? Feeling of Stress : Only a little  ?Social Connections: Not on file  ? ? ?Tobacco Counseling ?Counseling given: Not Answered ? ? ?Clinical Intake: ? ?Pre-visit preparation completed: Yes ? ?Pain : No/denies pain ? ?  ? ?Nutritional Status: BMI of 19-24  Normal ?Nutritional Risks: None ?Diabetes: No ? ?How often do you need to have someone help you when you read instructions, pamphlets, or other written materials from your doctor or pharmacy?: 1 - Never ?What is the last grade level you completed in school?: 50yrcollege ? ?Diabetic? no ? ?Interpreter Needed?: No ? ?Information entered by :: NAllen LPN ? ? ?Activities of Daily Living ? ?  01/06/2022  ?  1:07 PM  ?In your present state of health, do you have any difficulty performing the following activities:  ?Hearing? 0  ?Vision? 0  ?Difficulty concentrating or making decisions? 0  ?Walking or climbing stairs? 0  ?Dressing or bathing? 0  ?Doing errands, shopping? 0  ?Preparing Food and eating ? N  ?Using the Toilet? N  ?In the past six  months, have you accidently leaked urine? N  ?Do you have problems with loss of bowel control? N  ?Managing your Medications? N  ?Managing your Finances? N  ?Housekeeping or managing your Housekeeping? N  ? ? ?Patient Care  Team: ?Burnis Medin, MD as PCP - General (Internal Medicine) ?Olga Millers, MD as Consulting Physician (Obstetrics and Gynecology) ? ?Indicate any recent Medical Services you may have received from other than [facility] providers in the past year (date may be approximate). ? ?   ?Assessment:  ? This is a routine wellness examination for Janet Brock. ? ?Hearing/Vision screen ?Vision Screening - Comments:: Regular eye exams, [place] ? ?Dietary issues and exercise activities discussed: ?Current Exercise Habits: Home exercise routine, Type of exercise: walking, Time (Minutes): 20, Frequency (Times/Week): 4, Weekly Exercise (Minutes/Week): 80 ? ? Goals Addressed   ? ?  ?  ?  ?  ? This Visit's Progress  ?  Patient Stated     ?  01/06/2022, wants to join silver sneakers ?  ? ?  ? ?Depression Screen ? ?  11/26/2021  ?  9:12 AM 06/05/2021  ?  2:47 PM 11/14/2016  ? 10:09 AM  ?PHQ 2/9 Scores  ?PHQ - 2 Score 0 0 0  ?PHQ- 9 Score 0 2   ?  ?Fall Risk ? ?  01/06/2022  ?  1:07 PM 11/26/2021  ?  9:12 AM 06/05/2021  ?  2:05 PM 11/14/2016  ? 10:09 AM  ?Fall Risk   ?Falls in the past year? 0 0 0 No  ?Number falls in past yr: 0 0 0   ?Injury with Fall? 0 0 0   ?Risk for fall due to : No Fall Risks No Fall Risks    ?Follow up Falls evaluation completed;Education provided;Falls prevention discussed Falls evaluation completed    ? ? ?FALL RISK PREVENTION PERTAINING TO THE HOME: ? ?Any stairs in or around the home? Yes  ?If so, are there any without handrails? No  ?Home free of loose throw rugs in walkways, pet beds, electrical cords, etc? Yes  ?Adequate lighting in your home to reduce risk of falls? Yes  ? ?ASSISTIVE DEVICES UTILIZED TO PREVENT FALLS: ? ?Life alert? No  ?Use of a cane, walker or w/c? No  ?Grab bars  in the bathroom? No  ?Shower chair or bench in shower? No  ?Elevated toilet seat or a handicapped toilet? No  ? ?TIMED UP AND GO: ? ?Was the test performed? No .  ? ? ? ? ?Cognitive Function: ?  ?  ? ?  01/06/2022  ?  1:08 PM

## 2022-02-04 ENCOUNTER — Encounter: Payer: Self-pay | Admitting: Gastroenterology

## 2022-03-13 ENCOUNTER — Telehealth: Payer: Self-pay | Admitting: Gastroenterology

## 2022-03-13 NOTE — Telephone Encounter (Signed)
Patient called in response to receiving a letter stating Dr. Havery Moros wanted to do a repeat colonoscopy in 6 months (which would have been April 2023).  Does she need to come in for an OV, or can she just be scheduled for PV & procedure?  She is not on BT, nor is she diabetic.  Please advise.  Thank you.

## 2022-03-13 NOTE — Telephone Encounter (Signed)
She also stated that her doctors never received reports of the last colonoscopy.  They need to be sent to [facility] and Dr. Regis Bill.

## 2022-03-13 NOTE — Telephone Encounter (Signed)
No office visit is needed. PV and direct appt is OK since letter was mailed to patient.   06/2021 procedure report and path results have been routed to Dr. Regis Bill via epic.  06/2021 procedure report and path results have been faxed to [facility] at [phone].

## 2022-03-26 ENCOUNTER — Telehealth: Payer: Self-pay | Admitting: Internal Medicine

## 2022-03-26 MED ORDER — LEVOTHYROXINE SODIUM 100 MCG PO TABS: 100.0000 ug | ORAL_TABLET | Freq: Every day | ORAL | 0 refills | Status: DC

## 2022-03-26 NOTE — Telephone Encounter (Signed)
Pt call and stated she need a refill on her levothyroxine (SYNTHROID) 100 MCG tablet [facility]  [id number] [place]:  [phone] Fax:  [phone]

## 2022-03-26 NOTE — Telephone Encounter (Signed)
Rx sent to the pharmacy.

## 2022-04-14 ENCOUNTER — Ambulatory Visit (AMBULATORY_SURGERY_CENTER): Payer: Medicare HMO | Admitting: *Deleted

## 2022-04-14 VITALS — Ht 65.5 in | Wt 148.8 lb

## 2022-04-14 DIAGNOSIS — Z86010 Personal history of colonic polyps: Secondary | ICD-10-CM

## 2022-04-14 MED ORDER — NA SULFATE-K SULFATE-MG SULF 17.5-3.13-1.6 GM/177ML PO SOLN: 2.0000 | Freq: Once | ORAL | 0 refills | Status: AC

## 2022-04-14 NOTE — Progress Notes (Signed)

## 2022-04-18 ENCOUNTER — Other Ambulatory Visit: Payer: Self-pay | Admitting: Internal Medicine

## 2022-04-18 NOTE — Telephone Encounter (Signed)
Last Ov 11/26/21 Filled 03/26/22 Is it ok to refill?

## 2022-05-01 ENCOUNTER — Encounter: Payer: Self-pay | Admitting: Internal Medicine

## 2022-05-08 ENCOUNTER — Ambulatory Visit (AMBULATORY_SURGERY_CENTER): Payer: Medicare HMO | Admitting: Gastroenterology

## 2022-05-08 ENCOUNTER — Encounter: Payer: Self-pay | Admitting: Gastroenterology

## 2022-05-08 VITALS — BP 107/56 | HR 54 | Temp 97.8°F | Resp 12 | Ht 65.5 in | Wt 148.0 lb

## 2022-05-08 DIAGNOSIS — D123 Benign neoplasm of transverse colon: Secondary | ICD-10-CM | POA: Diagnosis not present

## 2022-05-08 DIAGNOSIS — K6389 Other specified diseases of intestine: Secondary | ICD-10-CM | POA: Diagnosis not present

## 2022-05-08 DIAGNOSIS — K635 Polyp of colon: Secondary | ICD-10-CM | POA: Diagnosis not present

## 2022-05-08 DIAGNOSIS — Z09 Encounter for follow-up examination after completed treatment for conditions other than malignant neoplasm: Secondary | ICD-10-CM | POA: Diagnosis not present

## 2022-05-08 DIAGNOSIS — D122 Benign neoplasm of ascending colon: Secondary | ICD-10-CM

## 2022-05-08 DIAGNOSIS — Z86010 Personal history of colonic polyps: Secondary | ICD-10-CM | POA: Diagnosis not present

## 2022-05-08 DIAGNOSIS — D125 Benign neoplasm of sigmoid colon: Secondary | ICD-10-CM

## 2022-05-08 MED ORDER — SODIUM CHLORIDE 0.9 % IV SOLN: 500.0000 mL | INTRAVENOUS | Status: DC

## 2022-05-08 NOTE — Progress Notes (Signed)
[name]   Primary Care Physician:  Burnis Medin, MD   Reason for Procedure:   History of colon polyps  Plan:    colonoscopy     HPI: Janet Brock is a 66 y.o. female  here for colonoscopy surveillance - numerous polyps (9) removed in October 2022, one large in piecemeal. Here for close surveillance follow up. Patient denies any bowel symptoms at this time. No family history of colon cancer known. Otherwise feels well without any cardiopulmonary symptoms.   I have discussed risks / benefits of anesthesia and endoscopic procedure with Janet Brock and they wish to proceed with the exams as outlined today.    Past Medical History:  Diagnosis Date   Anemia    past hx - off Iron   Heart murmur    History of varicella    Hypothyroidism    synthroid  ? hyperthyroid when young and thin and then  dx hypo dx at clinic at work    Migraines    pre menopausal  hx of vicodin  rescue     Past Surgical History:  Procedure Laterality Date   ABLATION     ? year    btl     CATARACT EXTRACTION Bilateral    12/06/2021, 12/20/2021   COLONOSCOPY     approx 2 years ago   HYSTEROSCOPY  05/08/2011   Procedure: HYSTEROSCOPY WITH HYDROTHERMAL ABLATION;  Surgeon: Olga Millers;  Location: [facility];  Service: Gynecology;  Laterality: N/A;   svd       x 2   WISDOM TOOTH EXTRACTION      Prior to Admission medications   Medication Sig Start Date End Date Taking? Authorizing Provider  ASPIRIN 81 PO Take 1 tablet by mouth daily. Three x a week   Yes [provider]  levothyroxine (SYNTHROID) 100 MCG tablet TAKE 1 TABLET BY MOUTH EVERY DAY 04/20/22  Yes Panosh, Standley Brooking, MD    Current Outpatient Medications  Medication Sig Dispense Refill   ASPIRIN 81 PO Take 1 tablet by mouth daily. Three x a week     levothyroxine (SYNTHROID) 100 MCG tablet TAKE 1 TABLET BY MOUTH EVERY DAY 90 tablet 2   Current Facility-Administered Medications  Medication  Dose Route Frequency Provider Last Rate Last Admin   0.9 %  sodium chloride infusion  500 mL Intravenous Continuous Lemonte Al, Carlota Raspberry, MD        Allergies as of 05/08/2022 - Review Complete 05/08/2022  Allergen Reaction Noted   Sumatriptan Shortness Of Breath and Other (See Comments) 06/22/2007   Amoxicillin-pot clavulanate Other (See Comments) 07/11/2019    Family History  Problem Relation Age of Onset   Leukemia Mother    Cancer Mother 82       Breast Cancer   Breast cancer Mother    Alcohol abuse Father    Heart disease Father    Dementia Father    Lymphoma Sister    Thyroid disease Sister    Cancer Maternal Aunt        Breast, Skin   Leukemia Maternal Aunt    Breast cancer Maternal Aunt    Colon cancer Neg Hx    Colon polyps Neg Hx    Esophageal cancer Neg Hx    Rectal cancer Neg Hx    Stomach cancer Neg Hx     Social History   Socioeconomic History   Marital status: Single    Spouse name: Not on  file   Number of children: Not on file   Years of education: Not on file   Highest education level: Not on file  Occupational History   Not on file  Tobacco Use   Smoking status: Never   Smokeless tobacco: Never  Vaping Use   Vaping Use: Never used  Substance and Sexual Activity   Alcohol use: Yes    Alcohol/week: 2.0 standard drinks of alcohol    Types: 2 Glasses of wine per week   Drug use: No   Sexual activity: Yes    Partners: Male    Birth control/protection: Surgical  Other Topics Concern   Not on file  Social History Narrative   Receives 7 hours of sleep per night   Lives at home with her partner and sometimes her youngest son     Has 10 dogs (has show dogs), 1 cat and 5 goats   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    From [place] has lived in [place] and [place].   Mom lives in [place] father lives in [place].   g3 p3   Father passed 2016  alzhiemer prostate cancer age 63   Mom 13  generally well   Social Determinants of Health   Financial Resource Strain: Low Risk  (01/06/2022)   Overall Financial Resource Strain (CARDIA)    Difficulty of Paying Living Expenses: Not hard at all  Food Insecurity: No Food Insecurity (01/06/2022)   Hunger Vital Sign    Worried About Running Out of Food in the Last Year: Never true    Ran Out of Food in the Last Year: Never true  Transportation Needs: No Transportation Needs (01/06/2022)   PRAPARE - Hydrologist (Medical): No    Lack of Transportation (Non-Medical): No  Physical Activity: Insufficiently Active (01/06/2022)   Exercise Vital Sign    Days of Exercise per Week: 4 days    Minutes of Exercise per Session: 20 min  Stress: No Stress Concern Present (01/06/2022)   [place]    Feeling of Stress : Only a little  Social Connections: Not on file  Intimate Partner Violence: Not on file    Review of Systems: All other review of systems negative except as mentioned in the HPI.  Physical Exam: Vital signs BP 131/64   Pulse (!) 53   Temp 97.8 F (36.6 C)   Ht 5' 5.5" (1.664 m)   Wt 148 lb (67.1 kg)   LMP 12/21/2013   SpO2 99%   BMI 24.25 kg/m   General:   Alert,  Well-developed, pleasant and cooperative in NAD Lungs:  Clear throughout to auscultation.   Heart:  Regular rate and rhythm Abdomen:  Soft, nontender and nondistended.   Neuro/Psych:  Alert and cooperative. Normal mood and affect. A and O x 3  Jolly Mango, MD [facility]

## 2022-05-08 NOTE — Op Note (Signed)
[facility] Patient Name: Janet Brock Procedure Date: 05/08/2022 8:30 AM MRN: [id number] Endoscopist: Remo Lipps P. Havery Moros , MD Age: 66 Referring MD:  Date of Birth: 1955-11-20 Gender: Female Account #: [id number] Procedure:                Colonoscopy Indications:              High risk colon cancer surveillance: Personal                            history of colonic polyps - numerous sessile                            serrated polyps removed on last exam 06/2021 -                            including large piecemeal removed in transverse                            colon, close follow up surveilance Medicines:                Monitored Anesthesia Care Procedure:                Pre-Anesthesia Assessment:                           - Prior to the procedure, a History and Physical                            was performed, and patient medications and                            allergies were reviewed. The patient's tolerance of                            previous anesthesia was also reviewed. The risks                            and benefits of the procedure and the sedation                            options and risks were discussed with the patient.                            All questions were answered, and informed consent                            was obtained. Prior Anticoagulants: The patient has                            taken no previous anticoagulant or antiplatelet                            agents. ASA Grade Assessment: II - A patient with  mild systemic disease. After reviewing the risks                            and benefits, the patient was deemed in                            satisfactory condition to undergo the procedure.                           After obtaining informed consent, the colonoscope                            was passed under direct vision. Throughout the                            procedure, the patient's blood  pressure, pulse, and                            oxygen saturations were monitored continuously. The                            Olympus PCF-H190DL ([id number]) Colonoscope was                            introduced through the anus and advanced to the the                            cecum, identified by appendiceal orifice and                            ileocecal valve. The colonoscopy was performed                            without difficulty. The patient tolerated the                            procedure well. The quality of the bowel                            preparation was fair. The ileocecal valve,                            appendiceal orifice, and rectum were photographed. Scope In: 8:41:40 AM Scope Out: 9:03:27 AM Scope Withdrawal Time: 0 hours 18 minutes 41 seconds  Total Procedure Duration: 0 hours 21 minutes 47 seconds  Findings:                 The perianal and digital rectal examinations were                            normal.                           A large amount of semi-liquid stool was found in  the entire colon, making visualization difficult.                            Lavage of the colon was performed using copious                            amounts of sterile water, resulting in clearance                            with fair visualization. I could see most of the                            colon fairly well however the cecum could not be                            cleared, residual adherent thick stool with                            residual seeds that clogged the endoscope.                           A 10 to 12 mm suspected flat polyp was found in the                            ascending colon. The polyp was flat. The polyp was                            removed with a cold snare. Resection and retrieval                            were complete.                           A large post polypectomy scar was noted in the                             proximal transverse colon with tattoo. A small area                            of residual diminutive polypoid was found. The                            polyp was flat. The polyp was removed with a cold                            snare. Resection and retrieval were complete.                           A 5 mm polyp was found in the sigmoid colon. The                            polyp was sessile. The polyp was removed with a  cold snare. Resection and retrieval were complete.                           A few small-mouthed diverticula were found in the                            sigmoid colon.                           Internal hemorrhoids were found during                            retroflexion. The hemorrhoids were small.                           The exam was otherwise without abnormality. Complications:            No immediate complications. Estimated blood loss:                            Minimal. Estimated Blood Loss:     Estimated blood loss was minimal. Impression:               - Preparation of the colon was fair, could not                            clear the cecum and portions of right colon as                            below.                           - One 10 to 12 mm suspected polyp in the ascending                            colon, removed with a cold snare. Resected and                            retrieved.                           - Postpolypectomy scar in the transverse colon                            generally looked good, diminutive polypoid tissue,                            removed with a cold snare. Resected and retrieved.                           - One 5 mm polyp in the sigmoid colon, removed with                            a cold snare. Resected and retrieved.                           -  Diverticulosis in the sigmoid colon.                           - Internal hemorrhoids.                           - The examination was otherwise  normal. Recommendation:           - Patient has a contact number available for                            emergencies. The signs and symptoms of potential                            delayed complications were discussed with the                            patient. Return to normal activities tomorrow.                            Written discharge instructions were provided to the                            patient.                           - Resume previous diet.                           - Continue present medications.                           - Await pathology results. 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 Remo Lipps P. Barby Colvard, MD 05/08/2022 9:15:00 AM This report has been signed electronically.

## 2022-05-08 NOTE — Patient Instructions (Signed)
Handouts on polyps and diverticulosis given to you today   YOU HAD AN ENDOSCOPIC PROCEDURE TODAY AT THE [facility]:   Refer to the procedure report that was given to you for any specific questions about what was found during the examination.  If the procedure report does not answer your questions, please call your gastroenterologist to clarify.  If you requested that your care partner not be given the details of your procedure findings, then the procedure report has been included in a sealed envelope for you to review at your convenience later.  YOU SHOULD EXPECT: Some feelings of bloating in the abdomen. Passage of more gas than usual.  Walking can help get rid of the air that was put into your GI tract during the procedure and reduce the bloating. If you had a lower endoscopy (such as a colonoscopy or flexible sigmoidoscopy) you may notice spotting of blood in your stool or on the toilet paper. If you underwent a bowel prep for your procedure, you may not have a normal bowel movement for a few days.  Please Note:  You might notice some irritation and congestion in your nose or some drainage.  This is from the oxygen used during your procedure.  There is no need for concern and it should clear up in a day or so.  SYMPTOMS TO REPORT IMMEDIATELY:  Following lower endoscopy (colonoscopy or flexible sigmoidoscopy):  Excessive amounts of blood in the stool  Significant tenderness or worsening of abdominal pains  Swelling of the abdomen that is new, acute  Fever of 100F or higher  For urgent or emergent issues, a gastroenterologist can be reached at any hour by calling [phone]. Do not use MyChart messaging for urgent concerns.    DIET:  We do recommend a small meal at first, but then you may proceed to your regular diet.  Drink plenty of fluids but you should avoid alcoholic beverages for 24 hours.  ACTIVITY:  You should plan to take it easy for the rest of today and you should  NOT DRIVE or use heavy machinery until tomorrow (because of the sedation medicines used during the test).    FOLLOW UP: Our staff will call the number listed on your records the next business day following your procedure.  We will call around 7:15- 8:00 am to check on you and address any questions or concerns that you may have regarding the information given to you following your procedure. If we do not reach you, we will leave a message.  If you develop any symptoms (ie: fever, flu-like symptoms, shortness of breath, cough etc.) before then, please call [phone].  If you test positive for Covid 19 in the 2 weeks post procedure, please call and report this information to us.    If any biopsies were taken you will be contacted by phone or by letter within the next 1-3 weeks.  Please call us at [phone] if you have not heard about the biopsies in 3 weeks.    SIGNATURES/CONFIDENTIALITY: You and/or your care partner have signed paperwork which will be entered into your electronic medical record.  These signatures attest to the fact that that the information above on your After Visit Summary has been reviewed and is understood.  Full responsibility of the confidentiality of this discharge information lies with you and/or your care-partner.  

## 2022-05-08 NOTE — Progress Notes (Signed)
Pt's states no medical or surgical changes since previsit or office visit. 

## 2022-05-08 NOTE — Progress Notes (Signed)
PT taken to PACU. Monitors in place. VSS. Report given to RN. 

## 2022-05-09 ENCOUNTER — Telehealth: Payer: Self-pay | Admitting: *Deleted

## 2022-05-09 NOTE — Telephone Encounter (Signed)
  Follow up Call-     05/08/2022    7:48 AM 07/01/2021   11:21 AM  Call back number  Post procedure Call Back phone  # [phone]  Permission to leave phone message Yes Yes     Patient questions:  Do you have a fever, pain , or abdominal swelling? No. Pain Score  0 *  Have you tolerated food without any problems? Yes.    Have you been able to return to your normal activities? Yes.    Do you have any questions about your discharge instructions: Diet   No. Medications  No. Follow up visit  No.  Do you have questions or concerns about your Care? No.  Actions: * If pain score is 4 or above: No action needed, pain <4.

## 2022-07-01 DIAGNOSIS — Z1231 Encounter for screening mammogram for malignant neoplasm of breast: Secondary | ICD-10-CM | POA: Diagnosis not present

## 2022-07-15 DIAGNOSIS — H5213 Myopia, bilateral: Secondary | ICD-10-CM | POA: Diagnosis not present

## 2022-07-15 DIAGNOSIS — Z961 Presence of intraocular lens: Secondary | ICD-10-CM | POA: Diagnosis not present

## 2022-07-18 ENCOUNTER — Ambulatory Visit (INDEPENDENT_AMBULATORY_CARE_PROVIDER_SITE_OTHER): Payer: Medicare HMO

## 2022-07-18 DIAGNOSIS — Z23 Encounter for immunization: Secondary | ICD-10-CM | POA: Diagnosis not present

## 2022-10-07 IMAGING — MG DIGITAL DIAGNOSTIC BILAT W/ TOMO W/ CAD
8 series · 8 of 24 positions shown · non-contrast
Comparison: Previous exam(s).

CLINICAL DATA: 65-year-old female presenting for delayed two year
follow-up of a probably benign right breast mass.

EXAM:
DIGITAL DIAGNOSTIC BILATERAL MAMMOGRAM WITH TOMOSYNTHESIS AND CAD;
ULTRASOUND RIGHT BREAST LIMITED
TECHNIQUE: Bilateral digital diagnostic mammography and breast tomosynthesis
was performed. The images were evaluated with computer-aided
detection.; Targeted ultrasound examination of the right breast was
performed

[L CC synth-2D]
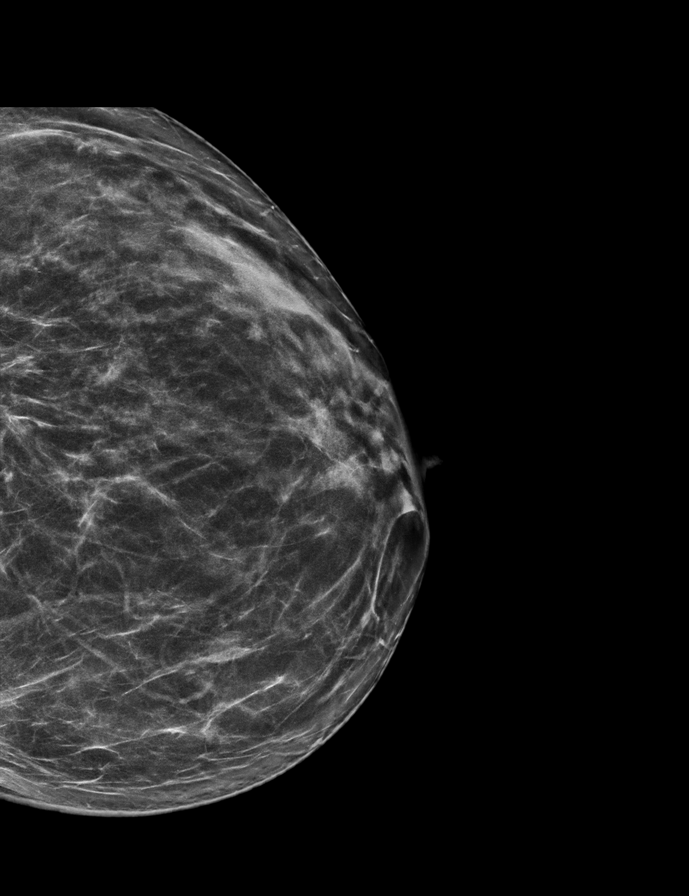

[L MLO synth-2D]
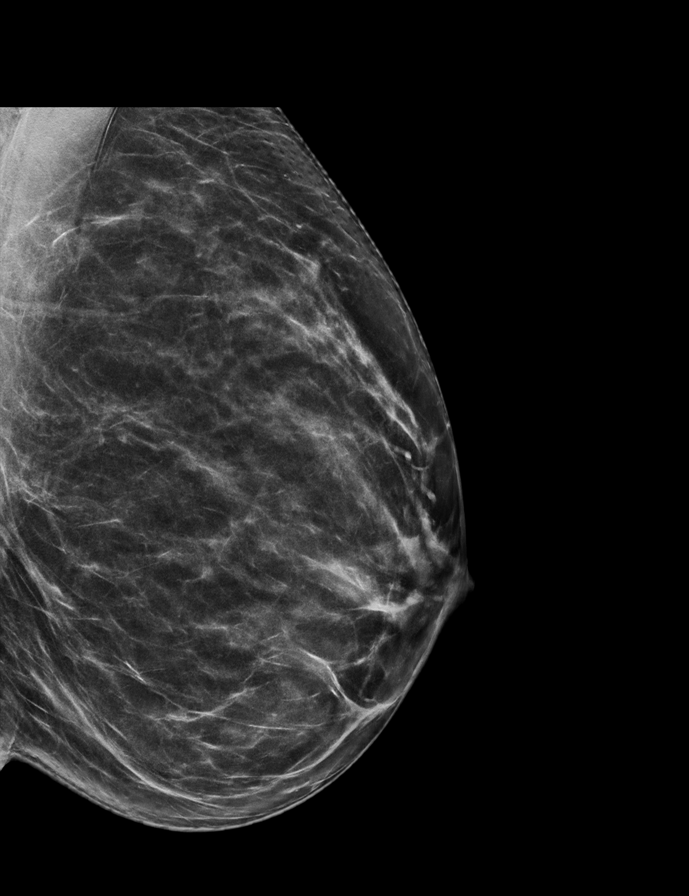

[R MLO synth-2D]
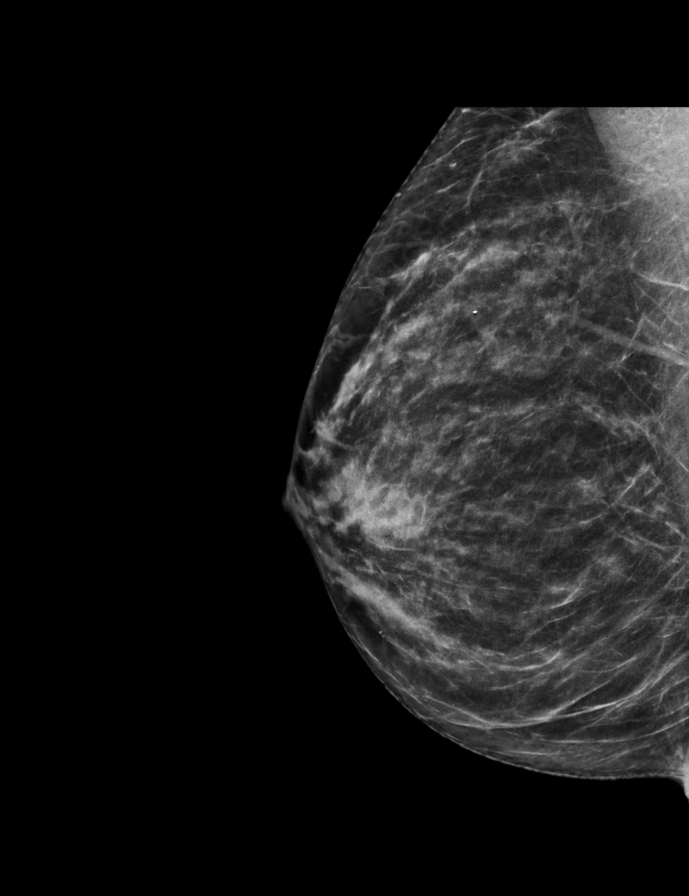

[R CC synth-2D]
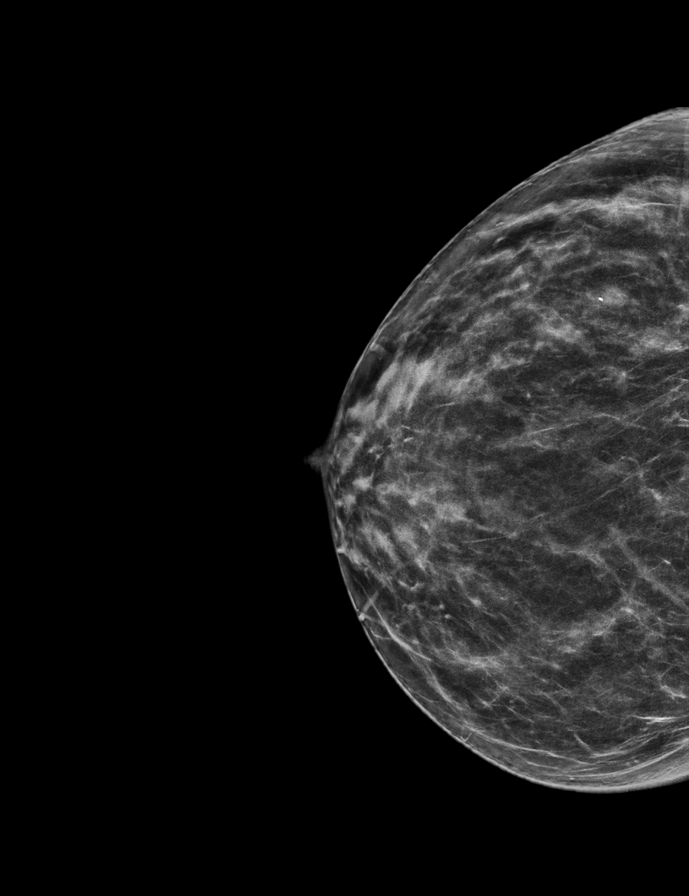

[L MLO tomo · tomo slice 35/70.0]
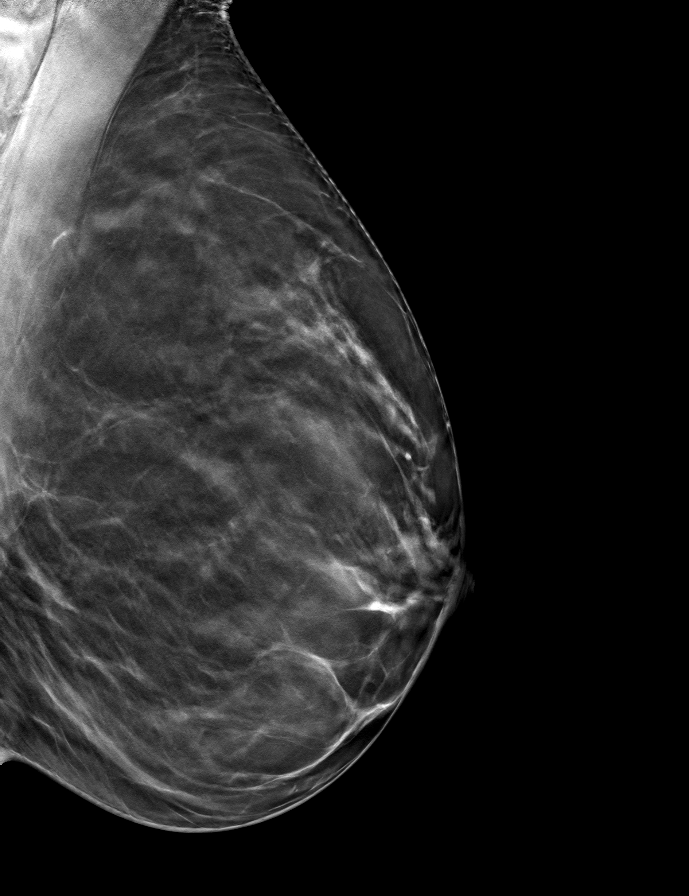

[R MLO tomo · tomo slice 31/62.0]
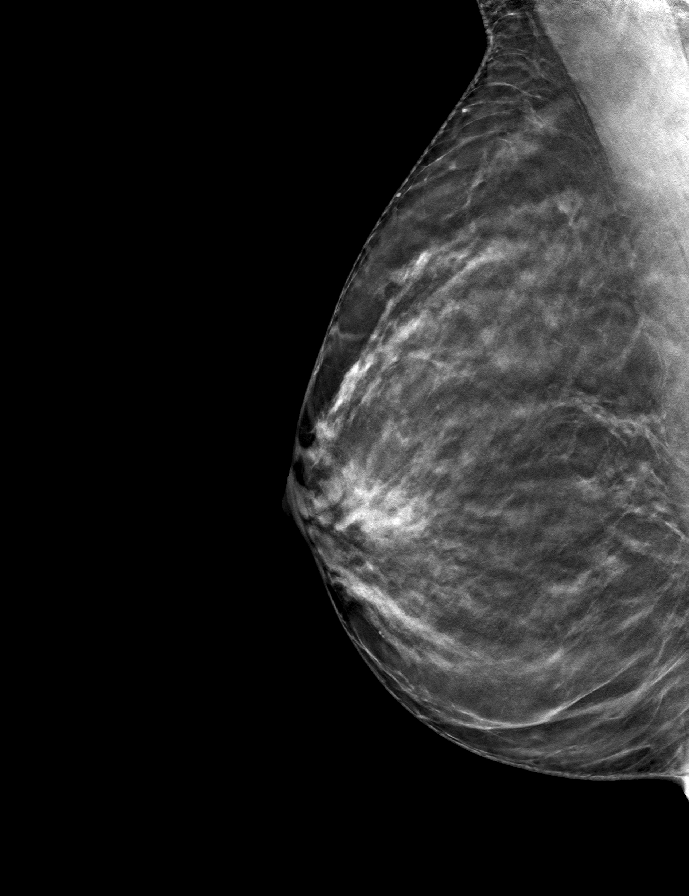

[R CC tomo · tomo slice 32/63.0]
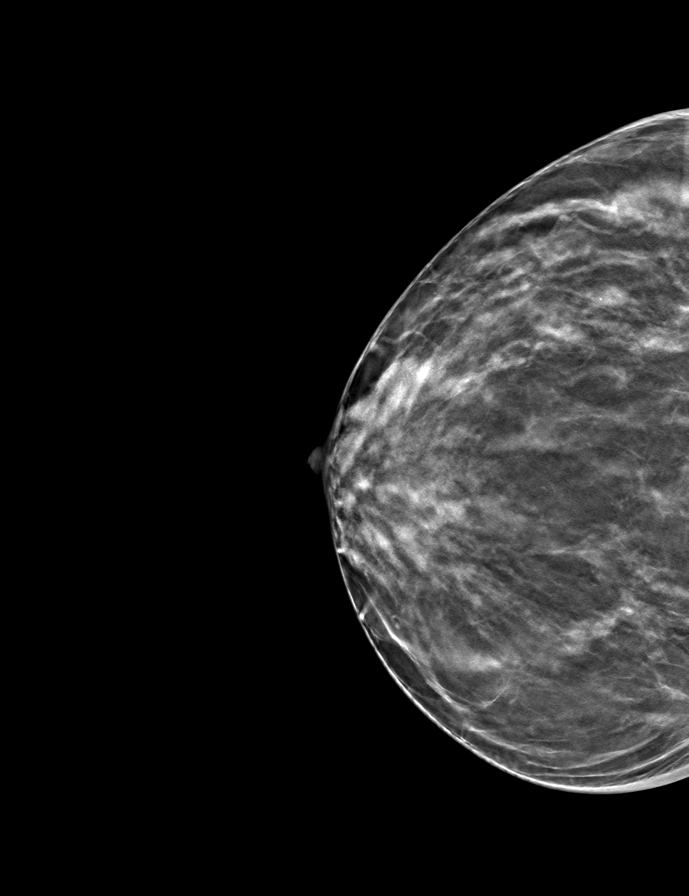

[L CC tomo · tomo slice 35/69.0]
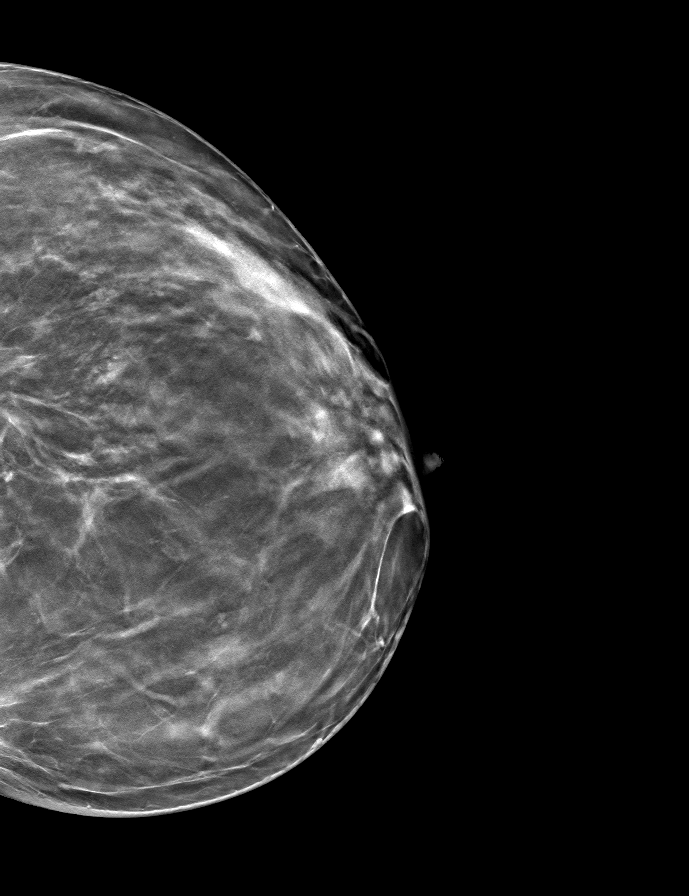

[8 of 24 positions shown; findings below may reference images not displayed]

ACR Breast Density Category c: The breast tissue is heterogeneously
dense, which may obscure small masses.
FINDINGS: Mammogram:

Right breast: There is a stable oval circumscribed mass with
calcification in the upper outer right breast. No new suspicious
mass, distortion, or microcalcifications.

Left breast: No suspicious mass, distortion, or microcalcifications
are identified to suggest presence of malignancy.

Ultrasound:

Targeted ultrasound performed in the right breast at 10 o'clock 4 cm
from the nipple demonstrating an oval circumscribed hypoechoic mass
measuring 0.7 x 0.3 x 0.8 cm, previously measuring 0.8 x 0.3 x
cm.
IMPRESSION: 1. Stable right breast mass at 10 o'clock. Given stability for over
2 years, this is considered benign.

2.  No mammographic evidence of malignancy in the left breast.

RECOMMENDATION:
Screening mammogram in one year.(Code:PK-L-5Z5)

I have discussed the findings and recommendations with the patient.
If applicable, a reminder letter will be sent to the patient
regarding the next appointment.

BI-RADS CATEGORY  2: Benign.

## 2022-10-07 IMAGING — US US BREAST*R* LIMITED INC AXILLA
1 series · 7 of 7 positions shown · non-contrast
Comparison: Previous exam(s).

CLINICAL DATA: 65-year-old female presenting for delayed two year
follow-up of a probably benign right breast mass.

EXAM:
DIGITAL DIAGNOSTIC BILATERAL MAMMOGRAM WITH TOMOSYNTHESIS AND CAD;
ULTRASOUND RIGHT BREAST LIMITED
TECHNIQUE: Bilateral digital diagnostic mammography and breast tomosynthesis
was performed. The images were evaluated with computer-aided
detection.; Targeted ultrasound examination of the right breast was
performed

[Series 1: us breast*right* limited inc axilla · 0.06mm/px · 7 of 7 slices shown]
[im 1/7]
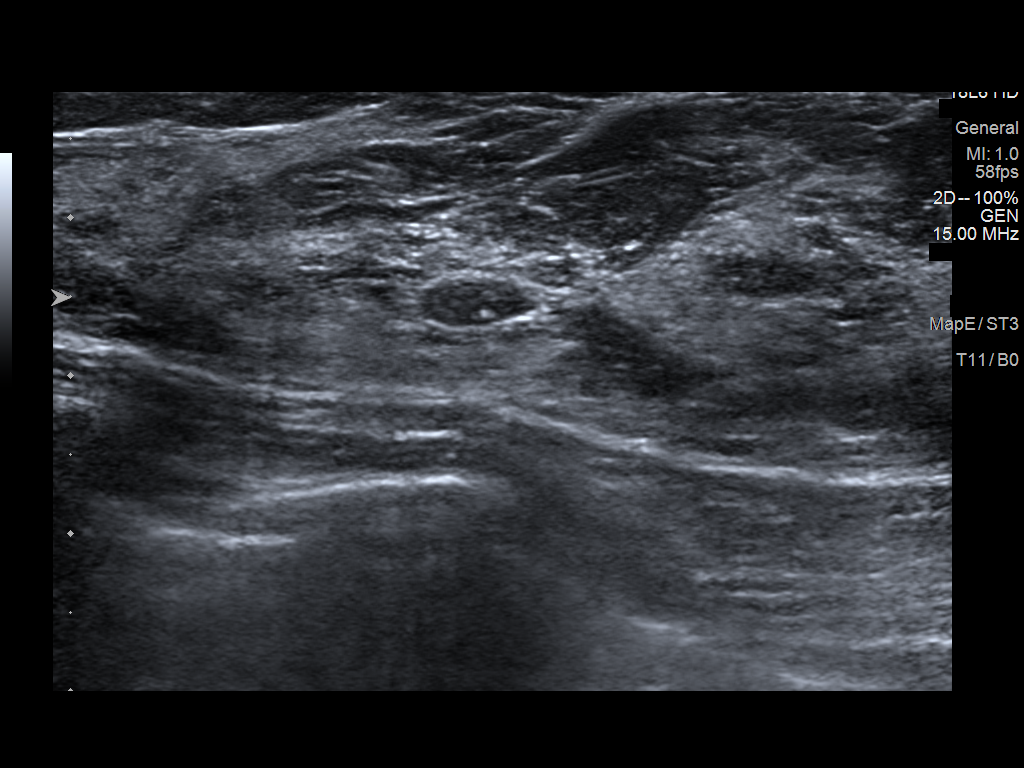
[im 2/7]
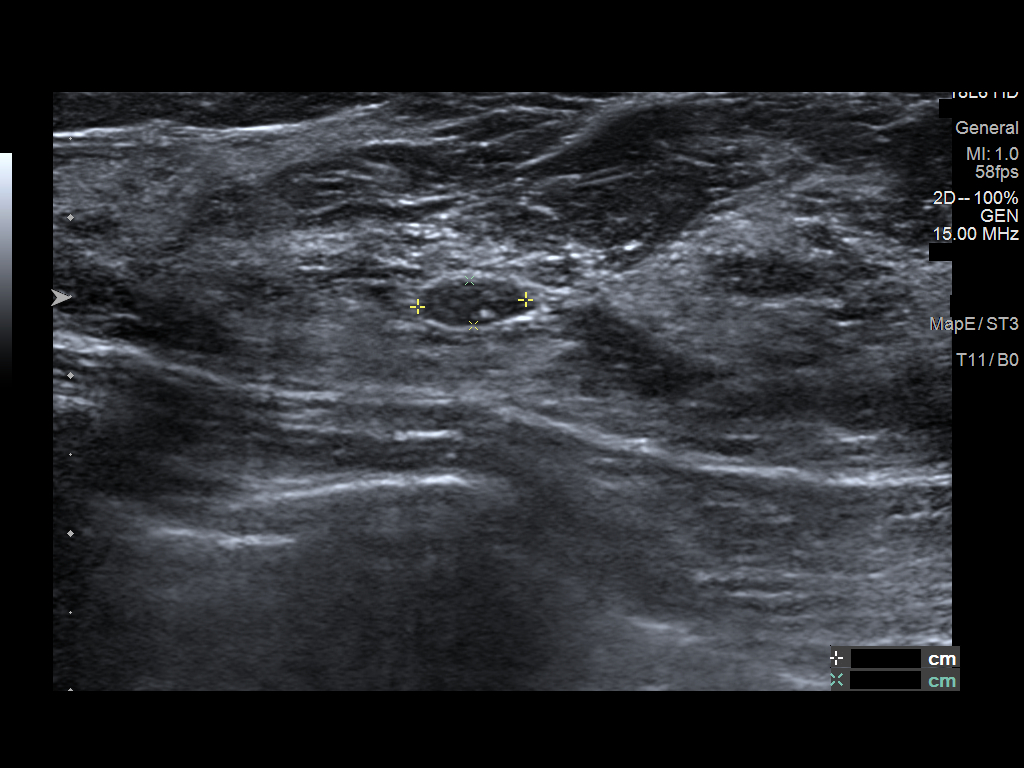
[im 3/7]
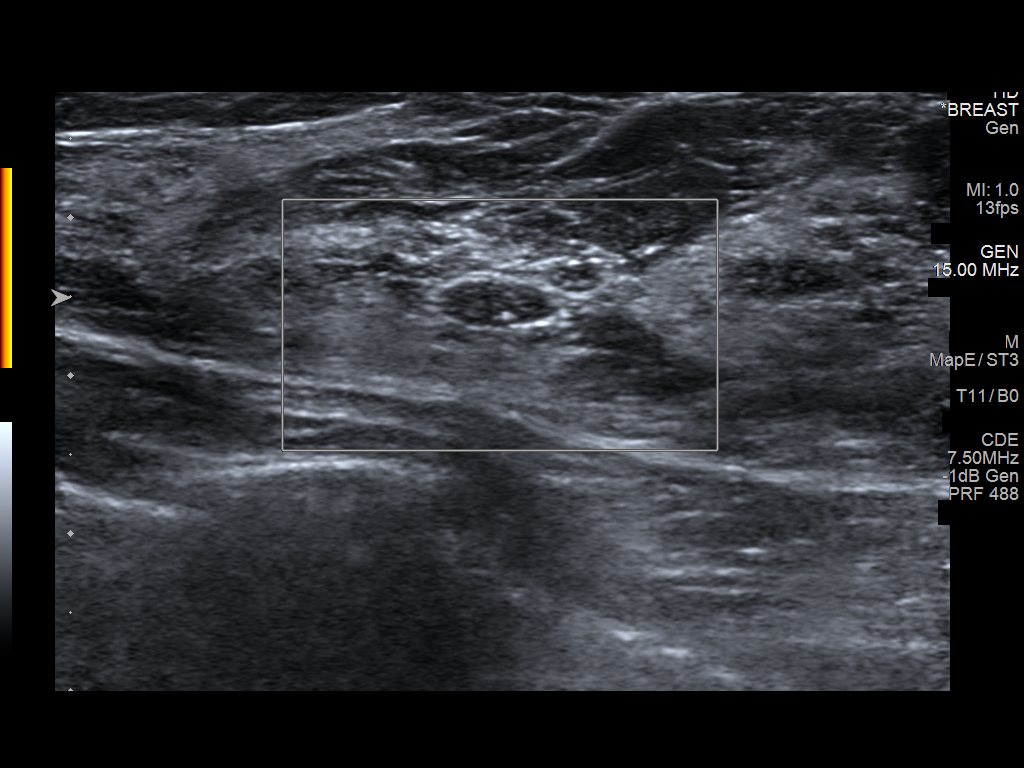
[im 4/7]
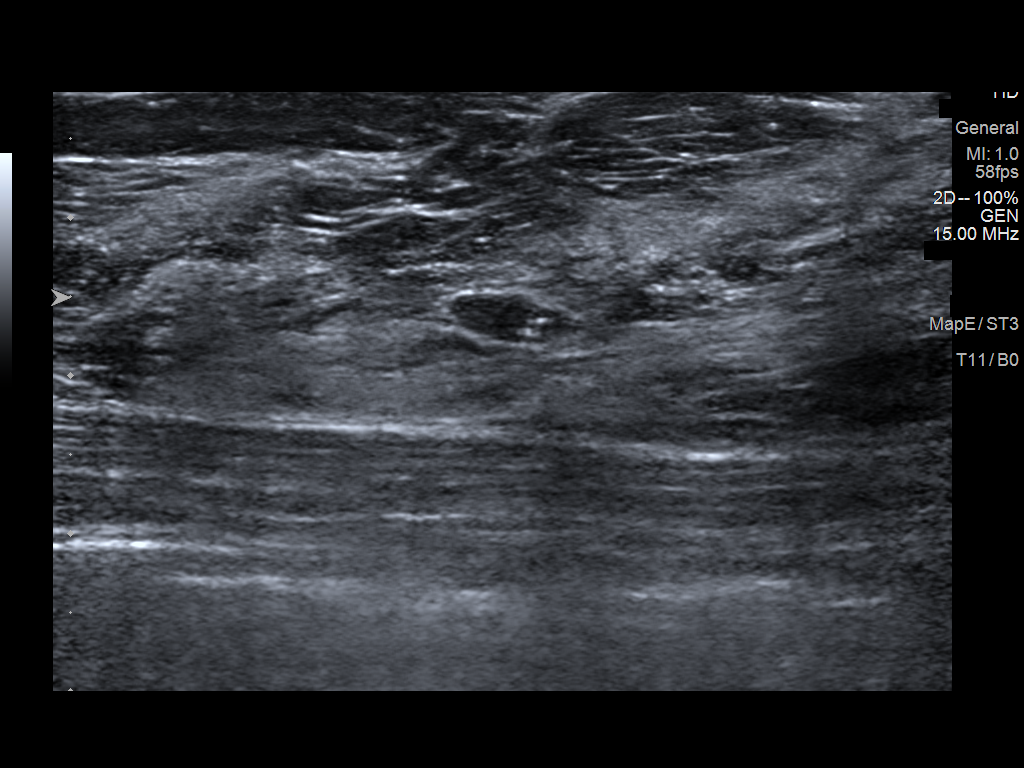
[im 5/7]
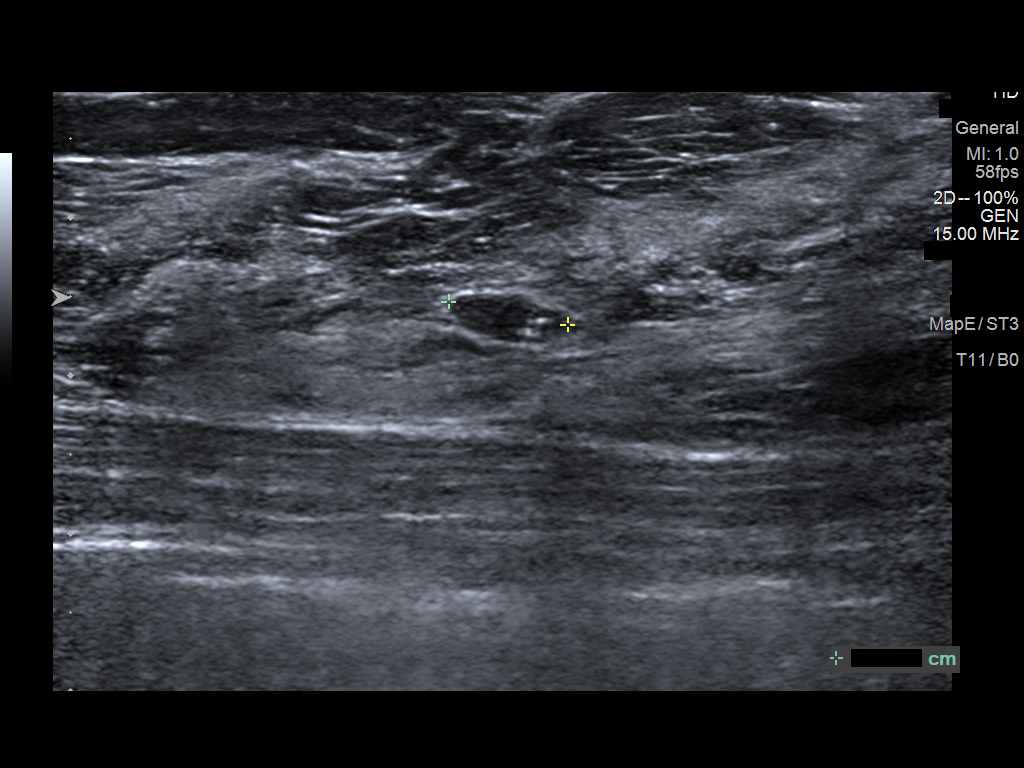
[im 6/7]
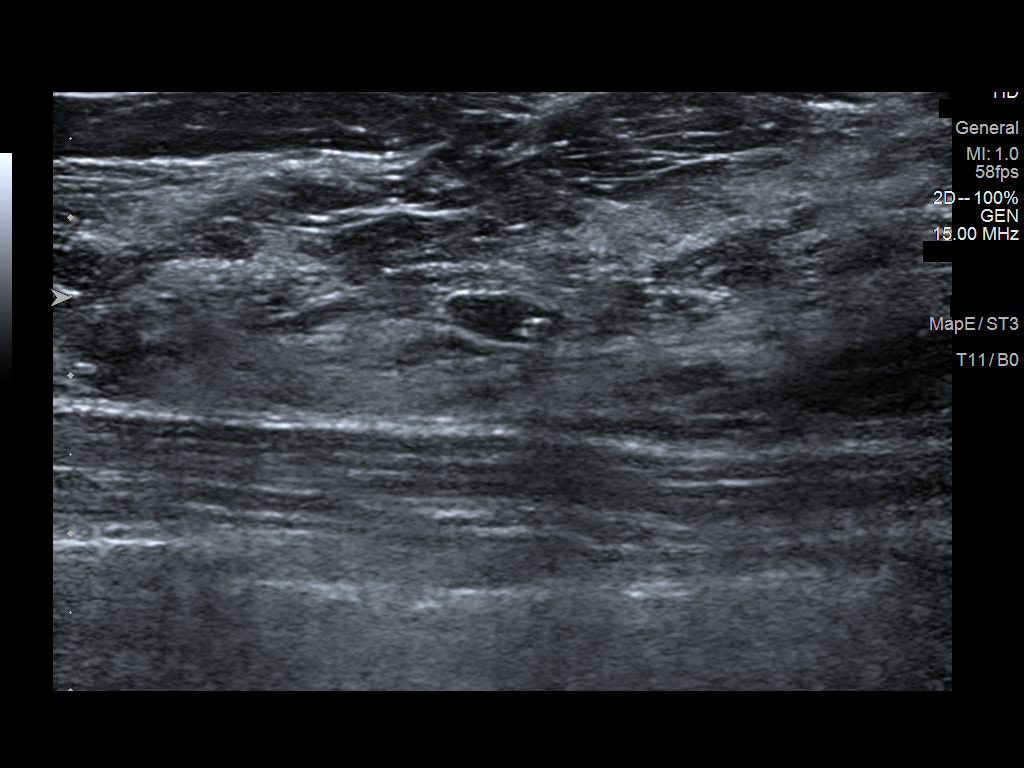
[im 7/7]
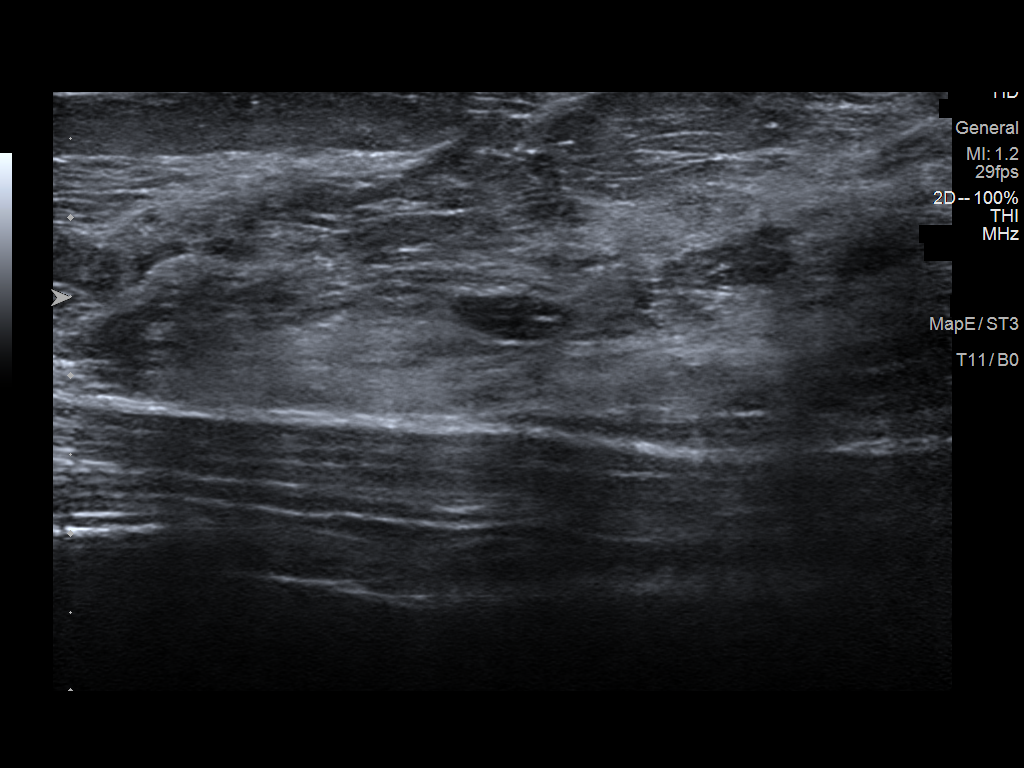

[7 of 7 positions shown; findings below may reference images not displayed]

ACR Breast Density Category c: The breast tissue is heterogeneously
dense, which may obscure small masses.
FINDINGS: Mammogram:

Right breast: There is a stable oval circumscribed mass with
calcification in the upper outer right breast. No new suspicious
mass, distortion, or microcalcifications.

Left breast: No suspicious mass, distortion, or microcalcifications
are identified to suggest presence of malignancy.

Ultrasound:

Targeted ultrasound performed in the right breast at 10 o'clock 4 cm
from the nipple demonstrating an oval circumscribed hypoechoic mass
measuring 0.7 x 0.3 x 0.8 cm, previously measuring 0.8 x 0.3 x
cm.
IMPRESSION: 1. Stable right breast mass at 10 o'clock. Given stability for over
2 years, this is considered benign.

2.  No mammographic evidence of malignancy in the left breast.

RECOMMENDATION:
Screening mammogram in one year.(Code:PK-L-5Z5)

I have discussed the findings and recommendations with the patient.
If applicable, a reminder letter will be sent to the patient
regarding the next appointment.

BI-RADS CATEGORY  2: Benign.

## 2022-11-05 ENCOUNTER — Encounter: Payer: Self-pay | Admitting: Internal Medicine

## 2022-11-05 ENCOUNTER — Ambulatory Visit (INDEPENDENT_AMBULATORY_CARE_PROVIDER_SITE_OTHER): Payer: Medicare HMO | Admitting: Internal Medicine

## 2022-11-05 VITALS — BP 140/78 | HR 58 | Temp 98.0°F | Ht 65.5 in | Wt 155.2 lb

## 2022-11-05 DIAGNOSIS — J029 Acute pharyngitis, unspecified: Secondary | ICD-10-CM

## 2022-11-05 DIAGNOSIS — B070 Plantar wart: Secondary | ICD-10-CM

## 2022-11-05 LAB — POCT RAPID STREP A (OFFICE): Rapid Strep A Screen: NEGATIVE

## 2022-11-05 NOTE — Patient Instructions (Addendum)
Possible a wart  vs a corn which can find at pressure points      can leave alone  Can try otc night plantar wart pads  may take a month to regress.  Doubt if the freezing  treatments otc would work that well .  Strep test  can miss a few cases of strep but being negative test  probably rules out based on the minimal symptoms  you have.

## 2022-11-05 NOTE — Progress Notes (Signed)
Chief Complaint  Patient presents with   Foot Problem    Pt reports she wants provider to take a look at Left foot- a small bump. Noticed it a month ago. Only feels it when walk on barefoot. Denied any pain.    Edema    Pt reports her lymph node are enlarge. Noticed it a wk ago. She states it is difficult for her to swallow but not painful. Denied of any sore throat.     HPI: Janet Brock 67 y.o. come in for  couple issues   bottom of foot  for weeks tender spots  and 2 lesions  no injury no hx FB  ? 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 No fever chills   Past Medical History:  Diagnosis Date   Anemia    past hx - off Iron   Heart murmur    History of varicella    Hypothyroidism    synthroid  ? hyperthyroid when young and thin and then  dx hypo dx at clinic at work    Migraines    pre menopausal  hx of vicodin  rescue     Family History  Problem Relation Age of Onset   Leukemia Mother    Cancer Mother 6       Breast Cancer   Breast cancer Mother    Alcohol abuse Father    Heart disease Father    Dementia Father    Lymphoma Sister    Thyroid disease Sister    Cancer Maternal Aunt        Breast, Skin   Leukemia Maternal Aunt    Breast cancer Maternal Aunt    Colon cancer Neg Hx    Colon polyps Neg Hx    Esophageal cancer Neg Hx    Rectal cancer Neg Hx    Stomach cancer Neg Hx     Social History   Socioeconomic History   Marital status: Single    Spouse name: Not on file   Number of children: Not on file   Years of education: Not on file   Highest education level: Some college, no degree  Occupational History   Not on file  Tobacco Use   Smoking status: Never   Smokeless tobacco: Never  Vaping Use   Vaping Use: Never used  Substance and Sexual Activity   Alcohol use: Yes    Alcohol/week:  2.0 standard drinks of alcohol    Types: 2 Glasses of wine per week   Drug use: No   Sexual activity: Yes    Partners: Male    Birth control/protection: Surgical  Other Topics Concern   Not on file  Social History Narrative   Receives 7 hours of sleep per night   Lives at home with her partner and sometimes her youngest son     Has 10 dogs (has show dogs), 1 cat and 5 goats   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    From [place] has lived in [place] and [place].   Mom lives in [place] father lives in [place].   g3 p3   Father passed 2016  alzhiemer prostate cancer age  15   Mom 84 generally well   Social Determinants of Health   Financial Resource Strain: Low Risk  (11/04/2022)   Overall Financial Resource Strain (CARDIA)    Difficulty of Paying Living Expenses: Not hard at all  Food Insecurity: No Food Insecurity (11/04/2022)   Hunger Vital Sign    Worried About Running Out of Food in the Last Year: Never true    Ran Out of Food in the Last Year: Never true  Transportation Needs: No Transportation Needs (11/04/2022)   PRAPARE - Hydrologist (Medical): No    Lack of Transportation (Non-Medical): No  Physical Activity: Insufficiently Active (11/04/2022)   Exercise Vital Sign    Days of Exercise per Week: 3 days    Minutes of Exercise per Session: 40 min  Stress: No Stress Concern Present (11/04/2022)   [place]    Feeling of Stress : Only a little  Social Connections: Socially Isolated (11/04/2022)   Social Connection and Isolation Panel [NHANES]    Frequency of Communication with Friends and Family: Once a week    Frequency of Social Gatherings with Friends and Family: Once a week    Attends Religious Services: Never    Marine scientist or Organizations: No    Attends Music therapist: Not on file     Marital Status: Living with partner    Outpatient Medications Prior to Visit  Medication Sig Dispense Refill   ASPIRIN 81 PO Take 1 tablet by mouth daily. Three x a week     levothyroxine (SYNTHROID) 100 MCG tablet TAKE 1 TABLET BY MOUTH EVERY DAY 90 tablet 2   No facility-administered medications prior to visit.     EXAM:  BP (!) 140/78 (BP Location: Right Arm, Patient Position: Sitting, Cuff Size: Normal)   Pulse (!) 58   Temp 98 F (36.7 C) (Oral)   Ht 5' 5.5" (1.664 m)   Wt 155 lb 3.2 oz (70.4 kg)   LMP 12/21/2013   SpO2 98%   BMI 25.43 kg/m   Body mass index is 25.43 kg/m.  GENERAL: vitals reviewed and listed above, alert, oriented, appears well hydrated and in no acute distress HEENT: atraumatic, conjunctiva  clear, no obvious abnormalities on inspection of external nose and ears OP : no lesion edema or exudate  mild erythema  NECK: no obvious masses on inspection palpation  shoddy ac nodes mild tenderness  MS: moves all extremities without noticeable focal  abnormality skin mid me tarsal are l foot 2 small callous defomity flat  no fb  no discoloration or center seen  PSYCH: pleasant and cooperative, no obvious depression or anxiety  BP Readings from Last 3 Encounters:  11/05/22 (!) 140/78  05/08/22 (!) 107/56  11/26/21 120/74    ASSESSMENT AND PLAN:  Discussed the following assessment and plan:  Plantar warts - probable  vx corn.  Sore throat - minor  but coworker recently dx with strep throat neg rs - Plan: POC Rapid Strep A  Can try soak and topical at night or observe  Sx rx fu if progressive or alarm sx .  -Patient advised to return or notify health care team  if  new concerns arise.  Patient Instructions  Possible a wart  vs a corn which can find at pressure points      can leave alone  Can try otc night plantar wart pads  may take a  month to regress.  Doubt if the freezing  treatments otc would work that well .  Strep test  can miss a few cases  of strep but being negative test  probably rules out based on the minimal symptoms  you have.    Standley Brooking. Naoko Diperna M.D.

## 2022-12-07 ENCOUNTER — Encounter: Payer: Self-pay | Admitting: Emergency Medicine

## 2022-12-07 ENCOUNTER — Ambulatory Visit
Admission: EM | Admit: 2022-12-07 | Discharge: 2022-12-07 | Disposition: A | Discharge status: Home / Self Care | Payer: Medicare HMO | Attending: Emergency Medicine | Admitting: Emergency Medicine

## 2022-12-07 DIAGNOSIS — J0100 Acute maxillary sinusitis, unspecified: Secondary | ICD-10-CM

## 2022-12-07 MED ORDER — AZITHROMYCIN 250 MG PO TABS: 250.0000 mg | ORAL_TABLET | Freq: Every day | ORAL | 0 refills | Status: DC

## 2022-12-07 NOTE — ED Triage Notes (Signed)
Patient c/o sinus congestion, pressure, headache, and nasal congestion that started a week ago.  Patient reports right sided facial pain that started last night.  Patient denies fevers.

## 2022-12-07 NOTE — ED Provider Notes (Signed)
[facility]    CSN: [id number] Arrival date & time: 12/07/22  0844      History   Chief Complaint Chief Complaint  Patient presents with   Sinus Problem   Nasal Congestion    HPI Janet Brock is a 67 y.o. female.   Patient presents for evaluation of nasal congestion, rhinorrhea and a mild nonproductive cough present for 7 days.  Has been having intermittent sinus pressure with associated headaches and bilateral ear popping.  Began to experience right-sided facial pain primarily under the right eye yesterday evening.  Has been managing symptoms with Sudafed.  An over-the-counter cold and flu medication which has been helpful but believes symptoms are now worsening.  Denies fevers, shortness of breath, wheezing.     Past Medical History:  Diagnosis Date   Anemia    past hx - off Iron   Heart murmur    History of varicella    Hypothyroidism    synthroid  ? hyperthyroid when young and thin and then  dx hypo dx at clinic at work    Migraines    pre menopausal  hx of vicodin  rescue     Patient Active Problem List   Diagnosis Date Noted   Breast lump 05/19/2018   Greater trochanteric bursitis of right hip 11/14/2016   Skin lesion 06/06/2014   Hypothyroidism    ANEMIA-NOS 06/22/2007    Past Surgical History:  Procedure Laterality Date   ABLATION     ? year    btl     CATARACT EXTRACTION Bilateral    12/06/2021, 12/20/2021   COLONOSCOPY     approx 2 years ago   HYSTEROSCOPY  05/08/2011   Procedure: HYSTEROSCOPY WITH HYDROTHERMAL ABLATION;  Surgeon: Olga Millers;  Location: [facility];  Service: Gynecology;  Laterality: N/A;   svd       x 2   WISDOM TOOTH EXTRACTION      OB History     Gravida  3   Para  3   Term  3   Preterm      AB      Living         SAB      IAB      Ectopic      Multiple      Live Births               Home Medications    Prior to Admission medications   Medication Sig Start Date End Date Taking?  Authorizing Provider  ASPIRIN 81 PO Take 1 tablet by mouth daily. Three x a week    [provider]  levothyroxine (SYNTHROID) 100 MCG tablet TAKE 1 TABLET BY MOUTH EVERY DAY 04/20/22   Panosh, Standley Brooking, MD    Family History Family History  Problem Relation Age of Onset   Leukemia Mother    Cancer Mother 84       Breast Cancer   Breast cancer Mother    Alcohol abuse Father    Heart disease Father    Dementia Father    Lymphoma Sister    Thyroid disease Sister    Cancer Maternal Aunt        Breast, Skin   Leukemia Maternal Aunt    Breast cancer Maternal Aunt    Colon cancer Neg Hx    Colon polyps Neg Hx    Esophageal cancer Neg Hx    Rectal cancer Neg Hx    Stomach  cancer Neg Hx     Social History Social History   Tobacco Use   Smoking status: Never   Smokeless tobacco: Never  Vaping Use   Vaping Use: Never used  Substance Use Topics   Alcohol use: Yes    Alcohol/week: 2.0 standard drinks of alcohol    Types: 2 Glasses of wine per week   Drug use: No     Allergies   Sumatriptan and Amoxicillin-pot clavulanate   Review of Systems Review of Systems  Constitutional: Negative.   HENT:  Positive for congestion, rhinorrhea, sinus pressure, sinus pain and voice change. Negative for dental problem, drooling, ear discharge, ear pain, facial swelling, hearing loss, mouth sores, nosebleeds, postnasal drip, sneezing, sore throat, tinnitus and trouble swallowing.   Respiratory:  Positive for cough. Negative for apnea, choking, chest tightness, shortness of breath, wheezing and stridor.   Cardiovascular: Negative.   Gastrointestinal: Negative.      Physical Exam Triage Vital Signs ED Triage Vitals  Enc Vitals Group     BP 12/07/22 0856 103/77     Pulse Rate 12/07/22 0856 65     Resp 12/07/22 0856 14     Temp 12/07/22 0856 98.3 F (36.8 C)     Temp Source 12/07/22 0856 Oral     SpO2 12/07/22 0856 98 %     Weight 12/07/22 0855 145 lb (65.8 kg)     Height  12/07/22 0855 5\' 5"  (1.651 m)     Head Circumference --      Peak Flow --      Pain Score 12/07/22 0855 2     Pain Loc --      Pain Edu? --      Excl. in [place]? --    No data found.  Updated Vital Signs BP 103/77 (BP Location: Left Arm)   Pulse 65   Temp 98.3 F (36.8 C) (Oral)   Resp 14   Ht 5\' 5"  (1.651 m)   Wt 145 lb (65.8 kg)   LMP 12/21/2013   SpO2 98%   BMI 24.13 kg/m   Visual Acuity Right Eye Distance:   Left Eye Distance:   Bilateral Distance:    Right Eye Near:   Left Eye Near:    Bilateral Near:     Physical Exam Constitutional:      Appearance: Normal appearance.  HENT:     Head: Normocephalic.     Right Ear: Tympanic membrane, ear canal and external ear normal.     Left Ear: Tympanic membrane, ear canal and external ear normal.     Nose: Congestion and rhinorrhea present.     Right Turbinates: Swollen.     Left Turbinates: Swollen.     Right Sinus: Maxillary sinus tenderness present. No frontal sinus tenderness.     Left Sinus: No maxillary sinus tenderness or frontal sinus tenderness.     Mouth/Throat:     Mouth: Mucous membranes are moist.     Pharynx: No posterior oropharyngeal erythema.  Eyes:     Extraocular Movements: Extraocular movements intact.  Cardiovascular:     Rate and Rhythm: Normal rate and regular rhythm.     Pulses: Normal pulses.     Heart sounds: Normal heart sounds.  Pulmonary:     Effort: Pulmonary effort is normal.     Breath sounds: Normal breath sounds.  Skin:    General: Skin is warm and dry.  Neurological:     Mental Status: She is alert and oriented  to person, place, and time. Mental status is at baseline.  Psychiatric:        Mood and Affect: Mood normal.        Behavior: Behavior normal.      UC Treatments / Results  Labs (all labs ordered are listed, but only abnormal results are displayed) Labs Reviewed - No data to display  EKG   Radiology No results found.  Procedures Procedures (including  critical care time)  Medications Ordered in UC Medications - No data to display  Initial Impression / Assessment and Plan / UC Course  I have reviewed the triage vital signs and the nursing notes.  Pertinent labs & imaging results that were available during my care of the patient were reviewed by me and considered in my medical decision making (see chart for details).  Acute nonrecurrent maxillary sinusitis  Vital signs are stable patient is in no signs of distress nontoxic-appearing, symptoms and presentation is consistent with above diagnosis, discussed with patient, azithromycin prescribed, has had success with this medication in the past, recommended continued use of over-the-counter medications for additional supportive care with urgent care follow-up as needed Final Clinical Impressions(s) / UC Diagnoses   Final diagnoses:  None   Discharge Instructions   None    ED Prescriptions   None    PDMP not reviewed this encounter.   Hans Eden, [place] 12/07/22 [phone]

## 2022-12-07 NOTE — Discharge Instructions (Signed)
Today you are being treated for a sinus infection  Begin azithromycin as directed to provide coverage for bacteria    You can take Tylenol and/or Ibuprofen as needed for fever reduction and pain relief.   For cough: honey 1/2 to 1 teaspoon (you can dilute the honey in water or another fluid).  You can also use guaifenesin and dextromethorphan for cough. You can use a humidifier for chest congestion and cough.  If you don't have a humidifier, you can sit in the bathroom with the hot shower running.      For sore throat: try warm salt water gargles, cepacol lozenges, throat spray, warm tea or water with lemon/honey, popsicles or ice, or OTC cold relief medicine for throat discomfort.   For congestion: take a daily anti-histamine like Zyrtec, Claritin, and a oral decongestant, such as pseudoephedrine.  You can also use Flonase 1-2 sprays in each nostril daily.   It is important to stay hydrated: drink plenty of fluids (water, gatorade/powerade/pedialyte, juices, or teas) to keep your throat moisturized and help further relieve irritation/discomfort.

## 2022-12-19 DIAGNOSIS — J029 Acute pharyngitis, unspecified: Secondary | ICD-10-CM | POA: Diagnosis not present

## 2023-01-05 ENCOUNTER — Telehealth: Payer: Self-pay | Admitting: Internal Medicine

## 2023-01-05 NOTE — Telephone Encounter (Signed)
Contacted Janet Brock to schedule their annual wellness visit. Appointment made for 01/08/23.  Janet Brock AWV direct phone # [phone]

## 2023-01-05 NOTE — Telephone Encounter (Signed)
Called patient to schedule Medicare Annual Wellness Visit (AWV). Left message for patient to call back and schedule Medicare Annual Wellness Visit (AWV).  Last date of AWV: 01/06/22  Please schedule an appointment at any time with [facility] or [employer].  If any questions, please contact me at [phone].  Thank you ,  Rudell Cobb AWV direct phone # [phone]

## 2023-01-08 ENCOUNTER — Encounter: Payer: Self-pay | Admitting: Family Medicine

## 2023-01-08 ENCOUNTER — Ambulatory Visit (INDEPENDENT_AMBULATORY_CARE_PROVIDER_SITE_OTHER): Payer: Medicare HMO | Admitting: Family Medicine

## 2023-01-08 DIAGNOSIS — Z0000 Encounter for general adult medical examination without abnormal findings: Secondary | ICD-10-CM | POA: Diagnosis not present

## 2023-01-08 NOTE — Patient Instructions (Addendum)
I really enjoyed getting to talk with you today! I am available on Tuesdays and Thursdays for virtual visits if you have any questions or concerns, or if I can be of any further assistance.   CHECKLIST FROM ANNUAL WELLNESS VISIT:  -Follow up (please call to schedule if not scheduled after visit):   -yearly for annual wellness visit with primary care office  Here is a list of your preventive care/health maintenance measures and the plan for each if any are due:  PLAN For any measures below that may be due:  -can get the vaccines at the pharmacy -we have requested a copy of your most recent mammogram -schedule your colonoscopy  Health Maintenance  Topic Date Due   Zoster Vaccines- Shingrix (2 of 2) 12/26/2021   COVID-19 Vaccine (4 - 2023-24 season) 05/23/2022   INFLUENZA VACCINE  04/23/2023   COLONOSCOPY (Pts 45-56yrs Insurance coverage will need to be confirmed)  05/09/2023   MAMMOGRAM  06/26/2023   Medicare Annual Wellness (AWV)  01/08/2024   DTaP/Tdap/Td (2 - Td or Tdap) 06/06/2024   Pneumonia Vaccine 76+ Years old  Completed   DEXA SCAN  Completed   Hepatitis C Screening  Completed   HPV VACCINES  Aged Out    -See a dentist at least yearly  -Get your eyes checked and then per your eye specialist's recommendations  -Other issues addressed today:  -Discuss your family history of BRCA gene mutation further with your gynecologist   -complete advanced directives (information provided below)   -I have included below further information regarding a healthy whole foods based diet, physical activity guidelines for adults, stress management and opportunities for social connections. I hope you find this information useful.    -----------------------------------------------------------------------------------------------------------------------------------------------------------------------------------------------------------------------------------------------------------  NUTRITION: -eat real food: lots of colorful vegetables (half the plate) and fruits -5-7 servings of vegetables and fruits per day (fresh or steamed is best), exp. 2 servings of vegetables with lunch and dinner and 2 servings of fruit per day. Berries and greens such as kale and collards are great choices.  -consume on a regular basis: whole grains (make sure first ingredient on label contains the word "whole"), fresh fruits, fish, nuts, seeds, healthy oils (such as olive oil, avocado oil, grape seed oil) -may eat small amounts of dairy and lean meat on occasion, but avoid processed meats such as ham, bacon, lunch meat, etc. -drink water -try to avoid fast food and pre-packaged foods, processed meat -most experts advise limiting sodium to <  per day, should limit further is any chronic conditions such as high blood pressure, heart disease, diabetes, etc. The American Heart Association advised that <  is is ideal -try to avoid foods that contain any ingredients with names you do not recognize  -try to avoid sugar/sweets (except for the natural sugar that occurs in fresh fruit) -try to avoid sweet drinks -try to avoid white rice, white bread, pasta (unless whole grain), white or yellow potatoes  EXERCISE GUIDELINES FOR ADULTS: -if you wish to increase your physical activity, do so gradually and with the approval of your doctor -STOP and seek medical care immediately if you have any chest pain, chest discomfort or trouble breathing when starting or increasing exercise  -move and stretch your body, legs, feet and arms when sitting for long periods -Physical activity guidelines for optimal health in adults: -least 150 minutes per week of  aerobic exercise (can talk, but not sing) once approved by your doctor, 20-30 minutes of sustained activity or two 10  minute episodes of sustained activity every day.  -resistance training at least 2 days per week if approved by your doctor -balance exercises 3+ days per week:   Stand somewhere where you have something sturdy to hold onto if you lose balance.    1) lift up on toes, start with 5x per day and work up to 20x   2) stand and lift on leg straight out to the side so that foot is a few inches of the floor, start with 5x each side and work up to 20x each side   3) stand on one foot, start with 5 seconds each side and work up to 20 seconds on each side  If you need ideas or help with getting more active:  -Silver sneakers https://tools.silversneakers.com  -Walk with a Doc: http://stephens-thompson.biz/  -try to include resistance (weight lifting/strength building) and balance exercises twice per week: or the following link for ideas: ChessContest.fr  UpdateClothing.[email or web address].cy  STRESS MANAGEMENT: -can try meditating, or just sitting quietly with deep breathing while intentionally relaxing all parts of your body for 5 minutes daily -if you need further help with stress, anxiety or depression please follow up with your primary doctor or contact the wonderful folks at [place]: [place]: -options in [place] if you wish to engage in more social and exercise related activities:  -Silver sneakers https://tools.silversneakers.com  -Walk with a Doc: http://stephens-thompson.biz/  -Check out the [place] 50+ section on the [place] [employer] (hiking clubs, book clubs, cards and games, chess, exercise classes, aquatic classes and much more) - see the website for  details: https://www.[place].gov/departments/parks-recreation/active-adults50  -YouTube has lots of exercise videos for different ages and abilities as well  -[employer] (a variety of indoor and outdoor inperson activities for adults). [phone]. [address]  -Virtual Online Classes (a variety of topics): see seniorplanet.org or call [phone]  -consider volunteering at a school, hospice center, church, senior center or elsewhere         ADVANCED HEALTHCARE DIRECTIVES:  Everyone should have advanced health care directives in place. This is so that you get the care you want, should you ever be in a situation where you are unable to make your own medical decisions.   From the [place] Advanced Directive Website: "[place] are legal documents in which you give written instructions about your health care if, in the future, you cannot speak for yourself.   A health care power of attorney allows you to name a person you trust to make your health care decisions if you cannot make them yourself. A declaration of a desire for a natural death (or living will) is document, which states that you desire not to have your life prolonged by extraordinary measures if you have a terminal or incurable illness or if you are in a vegetative state. An advance instruction for mental health treatment makes a declaration of instructions, information and preferences regarding your mental health treatment. It also states that you are aware that the advance instruction authorizes a mental health treatment provider to act according to your wishes. It may also outline your consent or refusal of mental health treatment. A declaration of an anatomical gift allows anyone over the age of 74 to make a gift by will, organ donor card or other document."   Please see the following website or an elder law attorney for forms, FAQs and for completion of advanced  directives: [place] Secretary of State  O'[place] ([email or web address])  Or copy and paste the following to your web browser: [email or web address]

## 2023-01-08 NOTE — Progress Notes (Signed)
PATIENT CHECK-IN and HEALTH RISK ASSESSMENT QUESTIONNAIRE:  -completed by phone/video for upcoming Medicare Preventive Visit  Pre-Visit Check-in: 1)Vitals (height, wt, BP, etc) - record in vitals section for visit on day of visit 2)Review and Update Medications, Allergies PMH, Surgeries, Social history in Epic 3)Hospitalizations in the last year with date/reason? N/A  4)Review and Update Care Team (patient's specialists) in Epic 5) Complete PHQ9 in Epic  6) Complete Fall Screening in Epic 7)Review all Health Maintenance Due and order under PCP if not done.  8)Medicare Wellness Questionnaire: Answer theses question about your habits: Do you drink alcohol? Yes If yes, how many drinks do you have a day? socially Have you ever smoked? Never Quit date if applicable? N/a  How many packs a day do/did you smoke? N/a Do you use smokeless tobacco? N/a Do you use an illicit drugs?n/a  Do you exercises? Yes IF so, what type and how many days/minutes per week? Goes to gym to do different machines, treadmill, stair climber, elliptical and arms and legs, every other day - does 30-45 minutes and then does a lot of walking and activity on her [place] Are you sexually active? Yes Number of partners? 1 She feels could be better with her diet - does only fresh or frozen unprocessed veggies - reports the only thing she fries is an egg from time time Typical breakfast does not eat breakfast typically; may occasionally have a smoothie, cereal or bacon/eggs Typical lunch small salad; leftovers Typical dinner protein, carb, veggie Typical snacks: none  Beverages: coffee, water, tea, occ soft drinks  Answer theses question about you: Can you perform most household chores? yes Do you find it hard to follow a conversation in a noisy room? no Do you often ask people to speak up or repeat themselves? no Do you feel that you have a problem with memory? no Do you balance your checkbook and or bank acounts? yes Do  you feel safe at home? yes Last dentist visit? Has appt in 2 weeks; goes every 6 months Do you need assistance with any of the following: Please note if so no  Driving?  Feeding yourself?  Getting from bed to chair?  Getting to the toilet?  Bathing or showering?  Dressing yourself?  Managing money?  Climbing a flight of stairs  Preparing meals?  Do you have Advanced Directives in place (Living Will, Healthcare Power or Attorney)? No; would like to complete it.   Last eye Exam and location? Less than year ago at [facility]   Do you currently use prescribed or non-prescribed narcotic or opioid pain medications? no  Do you have a history or close family history of breast, ovarian, tubal or peritoneal cancer or a family member with BRCA (breast cancer susceptibility 1 and 2) gene mutations? Yes BRCA aunt & sister - sees gyn for this whom she reports is aware and gets yearly exam/mammograms  Nurse/Assistant Credentials/time stamp: Claudette Laws BSN, Editor, commissioning [facility] Clinical RN Supervisor   ----------------------------------------------------------------------------------------------------------------------------------------------------------------------------------------------------------------------   MEDICARE ANNUAL PREVENTIVE VISIT WITH PROVIDER: (Welcome to [employer], initial annual wellness or annual wellness exam)  Virtual Visit via Video Note  I connected with Janet Brock on 01/08/23 by  a video enabled telemedicine application and verified that I am speaking with the correct person using two identifiers.  Location patient: home Location provider:work or home office Persons participating in the virtual visit: patient, provider  Concerns and/or follow up today: reports is doing well.  See HM section in Epic for other details of completed HM.    ROS: negative for report of fevers, unintentional weight loss, vision changes, vision loss,  hearing loss or change, chest pain, sob, hemoptysis, melena, hematochezia, hematuria, falls, bleeding or bruising, thoughts of suicide or self harm, memory loss  Patient-completed extensive health risk assessment - reviewed and discussed with the patient: See Health Risk Assessment completed with patient prior to the visit either above or in recent phone note. This was reviewed in detailed with the patient today and appropriate recommendations, orders and referrals were placed as needed per Summary below and patient instructions.   Review of Medical History: -PMH, PSH, Family History and current specialty and care providers reviewed and updated and listed below   Patient Care Team: Panosh, Neta Mends, MD as PCP - General (Internal Medicine) Levi Aland, MD as Consulting Physician (Obstetrics and Gynecology)   Past Medical History:  Diagnosis Date   Anemia    past hx - off Iron   Heart murmur    History of varicella    Hypothyroidism    synthroid  ? hyperthyroid when young and thin and then  dx hypo dx at clinic at work    Migraines    pre menopausal  hx of vicodin  rescue     Past Surgical History:  Procedure Laterality Date   ABLATION     ? year    btl     CATARACT EXTRACTION Bilateral    12/06/2021, 12/20/2021   COLONOSCOPY     approx 2 years ago   HYSTEROSCOPY  05/08/2011   Procedure: HYSTEROSCOPY WITH HYDROTHERMAL ABLATION;  Surgeon: Levi Aland;  Location: [facility];  Service: Gynecology;  Laterality: N/A;   svd       x 2   WISDOM TOOTH EXTRACTION      Social History   Socioeconomic History   Marital status: Single    Spouse name: Not on file   Number of children: Not on file   Years of education: Not on file   Highest education level: Some college, no degree  Occupational History   Not on file  Tobacco Use   Smoking status: Never   Smokeless tobacco: Never  Vaping Use   Vaping Use: Never used  Substance and Sexual Activity   Alcohol use: Yes     Alcohol/week: 2.0 standard drinks of alcohol    Types: 2 Glasses of wine per week   Drug use: No   Sexual activity: Yes    Partners: Male    Birth control/protection: Surgical  Other Topics Concern   Not on file  Social History Narrative   Receives 7 hours of sleep per night   Lives at home with her partner and sometimes her youngest son     Has 10 dogs (has show dogs), 1 cat and 5 goats   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    From [place] has lived in [place] and [place].   Mom lives in [place] father lives in [place].   g3 p3   Father passed 2016  alzhiemer prostate cancer age 33   Mom 36 generally well   Social Determinants of Health   Financial Resource Strain: Low Risk  (11/04/2022)   Overall Financial Resource Strain (CARDIA)    Difficulty of Paying Living Expenses: Not hard at all  Food Insecurity: No Food  Insecurity (11/04/2022)   Hunger Vital Sign    Worried About Running Out of Food in the Last Year: Never true    Ran Out of Food in the Last Year: Never true  Transportation Needs: No Transportation Needs (11/04/2022)   PRAPARE - Administrator, Civil Service (Medical): No    Lack of Transportation (Non-Medical): No  Physical Activity: Insufficiently Active (11/04/2022)   Exercise Vital Sign    Days of Exercise per Week: 3 days    Minutes of Exercise per Session: 40 min  Stress: No Stress Concern Present (11/04/2022)   [employer] of Occupational Health - Occupational Stress Questionnaire    Feeling of Stress : Only a little  Social Connections: Socially Isolated (11/04/2022)   Social Connection and Isolation Panel [NHANES]    Frequency of Communication with Friends and Family: Once a week    Frequency of Social Gatherings with Friends and Family: Once a week    Attends Religious Services: Never    Database administrator or Organizations: No    Attends Engineer, structural: Not  on file    Marital Status: Living with partner  Intimate Partner Violence: Not on file    Family History  Problem Relation Age of Onset   Leukemia Mother    Cancer Mother 65       Breast Cancer   Breast cancer Mother    Alcohol abuse Father    Heart disease Father    Dementia Father    Lymphoma Sister    Thyroid disease Sister    Cancer Maternal Aunt        Breast, Skin   Leukemia Maternal Aunt    Breast cancer Maternal Aunt    Colon cancer Neg Hx    Colon polyps Neg Hx    Esophageal cancer Neg Hx    Rectal cancer Neg Hx    Stomach cancer Neg Hx     Current Outpatient Medications on File Prior to Visit  Medication Sig Dispense Refill   ASPIRIN 81 PO Take 1 tablet by mouth daily. Three x a week     levothyroxine (SYNTHROID) 100 MCG tablet TAKE 1 TABLET BY MOUTH EVERY DAY 90 tablet 2   No current facility-administered medications on file prior to visit.    Allergies  Allergen Reactions   Sumatriptan Shortness Of Breath and Other (See Comments)   Amoxicillin-Pot Clavulanate Other (See Comments)    Nauseated with Augmentin        Physical Exam There were no vitals filed for this visit. Estimated body mass index is 24.13 kg/m as calculated from the following:   Height as of 12/07/22: 5\' 5"  (1.651 m).   Weight as of 12/07/22: 145 lb (65.8 kg).  EKG (optional): deferred due to virtual visit  GENERAL: alert, oriented, no acute distress detected, full vision exam deferred due to pandemic and/or virtual encounter  HEENT: atraumatic, conjunttiva clear, no obvious abnormalities on inspection of external nose and ears  NECK: normal movements of the head and neck  LUNGS: on inspection no signs of respiratory distress, breathing rate appears normal, no obvious gross SOB, gasping or wheezing  CV: no obvious cyanosis  MS: moves all visible extremities without noticeable abnormality  PSYCH/NEURO: pleasant and cooperative, no obvious depression or anxiety, speech and  thought processing grossly intact, Cognitive function grossly intact  [employer] Visit from 11/05/2022 in [facility] [place]  PHQ-9 Total Score 0  01/08/2023   11:08 AM 11/05/2022    3:59 PM 11/26/2021    9:12 AM 06/05/2021    2:47 PM 11/14/2016   10:09 AM  Depression screen PHQ 2/9  Decreased Interest 0 0 0 0 0  Down, Depressed, Hopeless 0 0 0 0 0  PHQ - 2 Score 0 0 0 0 0  Altered sleeping  0 0 0   Tired, decreased energy  0 0 1   Change in appetite  0 0 1   Feeling bad or failure about yourself   0 0 0   Trouble concentrating  0 0 0   Moving slowly or fidgety/restless  0 0 0   Suicidal thoughts  0 0 0   PHQ-9 Score  0 0 2   Difficult doing work/chores    Not difficult at all        01/06/2022    1:07 PM 11/04/2022    4:41 PM 11/05/2022    3:59 PM 01/07/2023    2:00 PM 01/08/2023   11:08 AM  Fall Risk  Falls in the past year? 0 0 0 0 0  Was there an injury with Fall? 0  0  0  Fall Risk Category Calculator 0  0  0  Fall Risk Category (Retired) Low      (RETIRED) Patient Fall Risk Level Low fall risk      Patient at Risk for Falls Due to No Fall Risks  No Fall Risks  No Fall Risks  Fall risk Follow up Falls evaluation completed;Education provided;Falls prevention discussed  Falls evaluation completed  Falls evaluation completed     SUMMARY AND PLAN:  Encounter for Medicare annual wellness exam   Discussed applicable health maintenance/preventive health measures and advised and referred or ordered per patient preferences: -discussed recommendations for vaccines due - she knows can get at the pharmacy -staff requesting mammogram report from most recent in 2023 -pt reports staff assisting in scheduling her repeat colonosocpy she plans to do this summer  Health Maintenance  Topic Date Due   Zoster Vaccines- Shingrix (2 of 2) 12/26/2021   COVID-19 Vaccine (4 - 2023-24 season) 05/23/2022   INFLUENZA VACCINE  04/23/2023    COLONOSCOPY (Pts 45-81yrs Insurance coverage will need to be confirmed)  05/09/2023   MAMMOGRAM  06/26/2023   Medicare Annual Wellness (AWV)  01/08/2024   DTaP/Tdap/Td (2 - Td or Tdap) 06/06/2024   Pneumonia Vaccine 64+ Years old  Completed   DEXA SCAN  Completed   Hepatitis C Screening  Completed   HPV VACCINES  Aged Out   Does mammos and gyn exam with gyn  Education and counseling on the following was provided based on the above review of health and a plan/checklist for the patient, along with additional information discussed, was provided for the patient in the patient instructions :  -Advised on importance of completing advanced directives, discussed options for completing and provided information in patient instructions as well -she plans to discussed the Fam Hx of BRCA with her gyn further in regards to genetic testing -Advised and counseled on a healthy lifestyle - including the importance of a healthy diet, regular physical activity, social connections and stress management. -Reviewed patient's current diet. Advised and counseled on a whole foods based healthy diet. A summary of a healthy diet was provided in the Patient Instructions.  -reviewed patient's current physical activity level and discussed exercise guidelines for adults. Congratulated on meeting exercise guideline recommendations and encouraged her to continue to  be active.  -Advise yearly dental visits at minimum and regular eye exams   Follow up: see patient instructions     Patient Instructions  I really enjoyed getting to talk with you today! I am available on Tuesdays and Thursdays for virtual visits if you have any questions or concerns, or if I can be of any further assistance.   CHECKLIST FROM ANNUAL WELLNESS VISIT:  -Follow up (please call to schedule if not scheduled after visit):   -yearly for annual wellness visit with primary care office  Here is a list of your preventive care/health maintenance  measures and the plan for each if any are due:  PLAN For any measures below that may be due:  -can get the vaccines at the pharmacy -we have requested a copy of your most recent mammogram -schedule your colonoscopy  Health Maintenance  Topic Date Due   Zoster Vaccines- Shingrix (2 of 2) 12/26/2021   COVID-19 Vaccine (4 - 2023-24 season) 05/23/2022   INFLUENZA VACCINE  04/23/2023   COLONOSCOPY (Pts 45-55yrs Insurance coverage will need to be confirmed)  05/09/2023   MAMMOGRAM  06/26/2023   Medicare Annual Wellness (AWV)  01/08/2024   DTaP/Tdap/Td (2 - Td or Tdap) 06/06/2024   Pneumonia Vaccine 31+ Years old  Completed   DEXA SCAN  Completed   Hepatitis C Screening  Completed   HPV VACCINES  Aged Out    -See a dentist at least yearly  -Get your eyes checked and then per your eye specialist's recommendations  -Other issues addressed today:  -Discuss your family history of BRCA gene mutation further with your gynecologist   -complete advanced directives (information provided below)   -I have included below further information regarding a healthy whole foods based diet, physical activity guidelines for adults, stress management and opportunities for social connections. I hope you find this information useful.   -----------------------------------------------------------------------------------------------------------------------------------------------------------------------------------------------------------------------------------------------------------  NUTRITION: -eat real food: lots of colorful vegetables (half the plate) and fruits -5-7 servings of vegetables and fruits per day (fresh or steamed is best), exp. 2 servings of vegetables with lunch and dinner and 2 servings of fruit per day. Berries and greens such as kale and collards are great choices.  -consume on a regular basis: whole grains (make sure first ingredient on label contains the word "whole"), fresh fruits,  fish, nuts, seeds, healthy oils (such as olive oil, avocado oil, grape seed oil) -may eat small amounts of dairy and lean meat on occasion, but avoid processed meats such as ham, bacon, lunch meat, etc. -drink water -try to avoid fast food and pre-packaged foods, processed meat -most experts advise limiting sodium to < 2300mg  per day, should limit further is any chronic conditions such as high blood pressure, heart disease, diabetes, etc. The American Heart Association advised that < 1500mg  is is ideal -try to avoid foods that contain any ingredients with names you do not recognize  -try to avoid sugar/sweets (except for the natural sugar that occurs in fresh fruit) -try to avoid sweet drinks -try to avoid white rice, white bread, pasta (unless whole grain), white or yellow potatoes  EXERCISE GUIDELINES FOR ADULTS: -if you wish to increase your physical activity, do so gradually and with the approval of your doctor -STOP and seek medical care immediately if you have any chest pain, chest discomfort or trouble breathing when starting or increasing exercise  -move and stretch your body, legs, feet and arms when sitting for long periods -Physical activity guidelines for optimal health in  adults: -least 150 minutes per week of aerobic exercise (can talk, but not sing) once approved by your doctor, 20-30 minutes of sustained activity or two 10 minute episodes of sustained activity every day.  -resistance training at least 2 days per week if approved by your doctor -balance exercises 3+ days per week:   Stand somewhere where you have something sturdy to hold onto if you lose balance.    1) lift up on toes, start with 5x per day and work up to 20x   2) stand and lift on leg straight out to the side so that foot is a few inches of the floor, start with 5x each side and work up to 20x each side   3) stand on one foot, start with 5 seconds each side and work up to 20 seconds on each side  If you need  ideas or help with getting more active:  -Silver sneakers https://tools.silversneakers.com  -Walk with a Doc: http://www.duncan-williams.com/  -try to include resistance (weight lifting/strength building) and balance exercises twice per week: or the following link for ideas: http://castillo-powell.com/  BuyDucts.dk  STRESS MANAGEMENT: -can try meditating, or just sitting quietly with deep breathing while intentionally relaxing all parts of your body for 5 minutes daily -if you need further help with stress, anxiety or depression please follow up with your primary doctor or contact the wonderful folks at [facility]: [phone]  SOCIAL CONNECTIONS: -options in [place] if you wish to engage in more social and exercise related activities:  -Silver sneakers https://tools.silversneakers.com  -Walk with a Doc: http://www.duncan-williams.com/  -Check out the [facility] Active Adults 50+ section on the [place] of [employer] (hiking clubs, book clubs, cards and games, chess, exercise classes, aquatic classes and much more) - see the website for details: https://www.[place].gov/departments/parks-recreation/active-adults50  -YouTube has lots of exercise videos for different ages and abilities as well  -[facility] (a variety of indoor and outdoor inperson activities for adults). 9407037176. [address]  -Virtual Online Classes (a variety of topics): see seniorplanet.org or call 431-397-0034  -consider volunteering at a school, hospice center, church, senior center or elsewhere         ADVANCED HEALTHCARE DIRECTIVES:  Everyone should have advanced health care directives in place. This is so that you get the care you want, should you ever be in a situation where you are unable to make your own medical decisions.   From the [place] Advanced Directive  Website: "Advance Health Care Directives are legal documents in which you give written instructions about your health care if, in the future, you cannot speak for yourself.   A health care power of attorney allows you to name a person you trust to make your health care decisions if you cannot make them yourself. A declaration of a desire for a natural death (or living will) is document, which states that you desire not to have your life prolonged by extraordinary measures if you have a terminal or incurable illness or if you are in a vegetative state. An advance instruction for mental health treatment makes a declaration of instructions, information and preferences regarding your mental health treatment. It also states that you are aware that the advance instruction authorizes a mental health treatment provider to act according to your wishes. It may also outline your consent or refusal of mental health treatment. A declaration of an anatomical gift allows anyone over the age of 44 to make a gift by will, organ donor card or other  document."   Please see the following website or an elder law attorney for forms, FAQs and for completion of advanced directives: [place] TEFL teacher Health Care Directives Advance Health Care Directives (http://guzman.com/)  Or copy and paste the following to your web browser: PoshChat.fi    Terressa Koyanagi, DO

## 2023-01-12 ENCOUNTER — Other Ambulatory Visit: Payer: Self-pay | Admitting: Internal Medicine

## 2023-02-23 ENCOUNTER — Encounter: Payer: Self-pay | Admitting: Gastroenterology

## 2023-02-24 DIAGNOSIS — L570 Actinic keratosis: Secondary | ICD-10-CM | POA: Diagnosis not present

## 2023-02-24 DIAGNOSIS — L821 Other seborrheic keratosis: Secondary | ICD-10-CM | POA: Diagnosis not present

## 2023-02-24 DIAGNOSIS — D225 Melanocytic nevi of trunk: Secondary | ICD-10-CM | POA: Diagnosis not present

## 2023-02-24 DIAGNOSIS — Z85828 Personal history of other malignant neoplasm of skin: Secondary | ICD-10-CM | POA: Diagnosis not present

## 2023-02-24 DIAGNOSIS — L814 Other melanin hyperpigmentation: Secondary | ICD-10-CM | POA: Diagnosis not present

## 2023-02-24 DIAGNOSIS — L578 Other skin changes due to chronic exposure to nonionizing radiation: Secondary | ICD-10-CM | POA: Diagnosis not present

## 2023-02-24 DIAGNOSIS — L918 Other hypertrophic disorders of the skin: Secondary | ICD-10-CM | POA: Diagnosis not present

## 2023-03-18 ENCOUNTER — Ambulatory Visit (INDEPENDENT_AMBULATORY_CARE_PROVIDER_SITE_OTHER): Payer: Medicare HMO | Admitting: Internal Medicine

## 2023-03-18 ENCOUNTER — Encounter: Payer: Self-pay | Admitting: Internal Medicine

## 2023-03-18 ENCOUNTER — Ambulatory Visit (INDEPENDENT_AMBULATORY_CARE_PROVIDER_SITE_OTHER): Payer: Medicare HMO

## 2023-03-18 VITALS — BP 100/60 | HR 75 | Temp 98.4°F | Ht 65.0 in | Wt 144.8 lb

## 2023-03-18 DIAGNOSIS — R5383 Other fatigue: Secondary | ICD-10-CM | POA: Diagnosis not present

## 2023-03-18 DIAGNOSIS — R059 Cough, unspecified: Secondary | ICD-10-CM | POA: Diagnosis not present

## 2023-03-18 DIAGNOSIS — R053 Chronic cough: Secondary | ICD-10-CM | POA: Diagnosis not present

## 2023-03-18 DIAGNOSIS — J40 Bronchitis, not specified as acute or chronic: Secondary | ICD-10-CM | POA: Diagnosis not present

## 2023-03-18 DIAGNOSIS — R509 Fever, unspecified: Secondary | ICD-10-CM

## 2023-03-18 DIAGNOSIS — E039 Hypothyroidism, unspecified: Secondary | ICD-10-CM | POA: Diagnosis not present

## 2023-03-18 LAB — POC COVID19 BINAXNOW: SARS Coronavirus 2 Ag: NEGATIVE

## 2023-03-18 NOTE — Progress Notes (Signed)
Chief Complaint  Patient presents with   Cough    Pt reports intermittent cough. Started Memorial weekend. Worsen when take deep breath. Intermittent elevated temp.  99 to 100.1 started on first of June. Taking cough syrup when cough get really bad. Sometimes helps. Feeling fatigue. Sx going on for 2-3 wks. Coughing up phlegm sometimes in dark yellow/green.    Fatigue    HPI: KATRINE RADICH 67 y.o. come in for  e e above   since may 31  soon after exposed to burning trees  Early oin dry cough and low grade fever waxing and waning .  And now below  very tired but no sob . Hx of same    All s Comes and goes .   Ocass  otc meds.  Deep breath causes cough   better than at onset  No sneezing  sore throat .   Never had  a real infection  Exposed to burning  out side .   Didn't do a covid  test  because  not that sick  Outside  a lot  in country :no rash   low energy  level.  ? Thyroid med needing adjusted No anorexia  sob hemoptysis .  No GI  Highest  temp 101  ocass chill.   Took tylenol. Extreme fatigue with this also  in between needs more naps .  No fever for at least 10 days but still having fatigue and intermittent cough . No one else sick at home.  No specific tick bite but  is outdoors  residence and out side  no  unusual animal exposure   Past Medical History:  Diagnosis Date   Anemia    past hx - off Iron   Heart murmur    History of varicella    Hypothyroidism    synthroid  ? hyperthyroid when young and thin and then  dx hypo dx at clinic at work    Migraines    pre menopausal  hx of vicodin  rescue     Family History  Problem Relation Age of Onset   Leukemia Mother    Cancer Mother 34       Breast Cancer   Breast cancer Mother    Alcohol abuse Father    Heart disease Father    Dementia Father    Lymphoma Sister    Thyroid disease Sister    Cancer Maternal Aunt        Breast, Skin   Leukemia Maternal Aunt    Breast cancer Maternal Aunt    Colon cancer Neg Hx     Colon polyps Neg Hx    Esophageal cancer Neg Hx    Rectal cancer Neg Hx    Stomach cancer Neg Hx     Social History   Socioeconomic History   Marital status: Single    Spouse name: Not on file   Number of children: Not on file   Years of education: Not on file   Highest education level: Some college, no degree  Occupational History   Not on file  Tobacco Use   Smoking status: Never   Smokeless tobacco: Never  Vaping Use   Vaping Use: Never used  Substance and Sexual Activity   Alcohol use: Yes    Alcohol/week: 2.0 standard drinks of alcohol    Types: 2 Glasses of wine per week   Drug use: No   Sexual activity: Yes    Partners: Male  Birth control/protection: Surgical  Other Topics Concern   Not on file  Social History Narrative   Receives 7 hours of sleep per night   Lives at home with her partner and sometimes her youngest son     Has 10 dogs (has show dogs), 1 cat and 5 goats   Works full Research scientist (medical)    NP on site    [phone] - 5  Work  Desk work.    From [place] has lived in [place] and [place].   Mom lives in [place] father lives in [place].   g3 p3   Father passed 2016  alzhiemer prostate cancer age 34   Mom 53 generally well   Social Determinants of Health   Financial Resource Strain: Low Risk  (11/04/2022)   Overall Financial Resource Strain (CARDIA)    Difficulty of Paying Living Expenses: Not hard at all  Food Insecurity: No Food Insecurity (11/04/2022)   Hunger Vital Sign    Worried About Running Out of Food in the Last Year: Never true    Ran Out of Food in the Last Year: Never true  Transportation Needs: No Transportation Needs (11/04/2022)   PRAPARE - Administrator, Civil Service (Medical): No    Lack of Transportation (Non-Medical): No  Physical Activity: Insufficiently Active (11/04/2022)   Exercise Vital Sign    Days of Exercise per Week: 3 days    Minutes of Exercise per Session: 40 min  Stress: No  Stress Concern Present (11/04/2022)   [employer] of Occupational Health - Occupational Stress Questionnaire    Feeling of Stress : Only a little  Social Connections: Socially Isolated (11/04/2022)   Social Connection and Isolation Panel [NHANES]    Frequency of Communication with Friends and Family: Once a week    Frequency of Social Gatherings with Friends and Family: Once a week    Attends Religious Services: Never    Database administrator or Organizations: No    Attends Engineer, structural: Not on file    Marital Status: Living with partner    Outpatient Medications Prior to Visit  Medication Sig Dispense Refill   ASPIRIN 81 PO Take 1 tablet by mouth daily. Three x a week     levothyroxine (SYNTHROID) 100 MCG tablet TAKE 1 TABLET BY MOUTH EVERY DAY 90 tablet 2   No facility-administered medications prior to visit.     EXAM:  BP 100/60 (BP Location: Left Arm, Patient Position: Sitting, Cuff Size: Normal)   Pulse 75   Temp 98.4 F (36.9 C) (Oral)   Ht 5\' 5"  (1.651 m)   Wt 144 lb 12.8 oz (65.7 kg)   LMP 12/21/2013   SpO2 94%   BMI 24.10 kg/m   Body mass index is 24.1 kg/m.  GENERAL: vitals reviewed and listed above, alert, oriented, appears well hydrated and in no acute distress ocass dry  cough   HEENT: atraumatic, conjunctiva  clear, no obvious abnormalities on inspection of external nose and ears tms cldear OP : no lesion edema or exudate  NECK: no obvious masses on inspection palpation  LUNGS: clear to auscultation bilaterally, no wheezes, rales or rhonchi, cough on deep breath CV: HRRR, no clubbing cyanosis or  peripheral edema nl cap refill  MS: moves all extremities without noticeable focal  abnormality Abdomen:  Sof,t normal bowel sounds without hepatosplenomegaly, no guarding rebound or masses no CVA tenderness PSYCH: pleasant and cooperative, no obvious depression or anxiety Skin  no rash or acute lesion Lab Results  Component Value Date   WBC  6.3 11/26/2021   HGB 14.3 11/26/2021   HCT 42.5 11/26/2021   PLT 331.0 11/26/2021   GLUCOSE 80 11/26/2021   CHOL 199 06/05/2021   TRIG 55.0 06/05/2021   HDL 76.80 06/05/2021   LDLCALC 111 (H) 06/05/2021   ALT 18 06/05/2021   AST 16 06/05/2021   NA 140 11/26/2021   K 4.7 11/26/2021   CL 105 11/26/2021   CREATININE 0.73 11/26/2021   BUN 20 11/26/2021   CO2 27 11/26/2021   TSH 0.57 11/26/2021   HGBA1C 5.5 06/05/2021   MICROALBUR <0.7 06/14/2021   BP Readings from Last 3 Encounters:  03/18/23 100/60  12/07/22 103/77  11/05/22 (!) 140/78    ASSESSMENT AND PLAN:  Discussed the following assessment and plan:  Cough, persistent - Plan: DG Chest 2 View, TSH, Basic metabolic panel, CBC with Differential/Platelet, Hepatic function panel, POC COVID-19  Low grade fever - Plan: DG Chest 2 View, TSH, Basic metabolic panel, CBC with Differential/Platelet, Hepatic function panel  Hypothyroidism, unspecified type - Plan: TSH, Basic metabolic panel, CBC with Differential/Platelet, Hepatic function panel  Other fatigue Febrile resp illness  slow improvement no more fever but persistent . ? Viral ? Infectious ? Post inhalation  Plan x ray and lab and close fu in order  Lab pending and may do empiric rx   antibiotic and steroid. -Patient advised to return or notify health care team  if  new concerns arise.  Patient Instructions  X ray shows bronchitis   changes .  Consider  prednisone and ? If antibiotic would be empirical . Let me see what  the blood work shows .   ? Post infectious  and or inhalation reaction?    Plan fu progress  report in next 10 - 14 days.     Neta Mends. Laquitha Heslin M.D.

## 2023-03-18 NOTE — Patient Instructions (Addendum)
X ray shows bronchitis   changes .  Consider  prednisone and ? If antibiotic would be empirical . Let me see what  the blood work shows .   ? Post infectious  and or inhalation reaction?    Plan fu progress  report in next 10 - 14 days.

## 2023-03-19 LAB — HEPATIC FUNCTION PANEL
ALT: 17 U/L (ref 0–35)
AST: 15 U/L (ref 0–37)
Albumin: 3.9 g/dL (ref 3.5–5.2)
Alkaline Phosphatase: 65 U/L (ref 39–117)
Bilirubin, Direct: 0.1 mg/dL (ref 0.0–0.3)
Total Bilirubin: 0.5 mg/dL (ref 0.2–1.2)
Total Protein: 7.1 g/dL (ref 6.0–8.3)

## 2023-03-19 LAB — CBC WITH DIFFERENTIAL/PLATELET
Basophils Absolute: 0.1 10*3/uL (ref 0.0–0.1)
Basophils Relative: 0.9 % (ref 0.0–3.0)
Eosinophils Absolute: 0.2 10*3/uL (ref 0.0–0.7)
Eosinophils Relative: 1.3 % (ref 0.0–5.0)
HCT: 40.3 % (ref 36.0–46.0)
Hemoglobin: 13.1 g/dL (ref 12.0–15.0)
Lymphocytes Relative: 20.3 % (ref 12.0–46.0)
Lymphs Abs: 3.1 10*3/uL (ref 0.7–4.0)
MCHC: 32.6 g/dL (ref 30.0–36.0)
MCV: 92.8 fl (ref 78.0–100.0)
Monocytes Absolute: 0.8 10*3/uL (ref 0.1–1.0)
Monocytes Relative: 5.4 % (ref 3.0–12.0)
Neutro Abs: 11.1 10*3/uL — ABNORMAL HIGH (ref 1.4–7.7)
Neutrophils Relative %: 72.1 % (ref 43.0–77.0)
Platelets: 452 10*3/uL — ABNORMAL HIGH (ref 150.0–400.0)
RBC: 4.34 Mil/uL (ref 3.87–5.11)
RDW: 14.2 % (ref 11.5–15.5)
WBC: 15.3 10*3/uL — ABNORMAL HIGH (ref 4.0–10.5)

## 2023-03-19 LAB — BASIC METABOLIC PANEL
BUN: 19 mg/dL (ref 6–23)
CO2: 29 mEq/L (ref 19–32)
Calcium: 9.2 mg/dL (ref 8.4–10.5)
Chloride: 104 mEq/L (ref 96–112)
Creatinine, Ser: 1.28 mg/dL — ABNORMAL HIGH (ref 0.40–1.20)
GFR: 43.41 mL/min — ABNORMAL LOW (ref >60.00)
Glucose, Bld: 79 mg/dL (ref 70–99)
Potassium: 3.9 mEq/L (ref 3.5–5.1)
Sodium: 140 mEq/L (ref 135–145)

## 2023-03-19 LAB — TSH: TSH: 6.28 u[IU]/mL — ABNORMAL HIGH (ref 0.35–5.50)

## 2023-03-20 ENCOUNTER — Telehealth: Payer: Self-pay | Admitting: Internal Medicine

## 2023-03-20 DIAGNOSIS — R509 Fever, unspecified: Secondary | ICD-10-CM

## 2023-03-20 DIAGNOSIS — E039 Hypothyroidism, unspecified: Secondary | ICD-10-CM

## 2023-03-20 DIAGNOSIS — R053 Chronic cough: Secondary | ICD-10-CM

## 2023-03-20 NOTE — Telephone Encounter (Signed)
Requesting a call to discuss labs 

## 2023-03-23 ENCOUNTER — Encounter: Payer: Self-pay | Admitting: Internal Medicine

## 2023-03-23 NOTE — Telephone Encounter (Signed)
Patient calling to check on progress of this request. Says she is concerned about some results that.

## 2023-03-23 NOTE — Progress Notes (Signed)
Thyroid is off could be from illness. But needs fu  Kidney function is off from baseline again could be from illness and hydration status .will need to be repeated    wbc is elevated but non specific and probably related to the illness .   let me know if fever has returned what is cough doing and any other sx .  Pl repeat blood work bmp tsh and free t4 cbcdiff    sometime this week.  Staff please place orders

## 2023-03-24 ENCOUNTER — Encounter: Payer: Self-pay | Admitting: Internal Medicine

## 2023-03-24 ENCOUNTER — Other Ambulatory Visit: Payer: Self-pay

## 2023-03-24 ENCOUNTER — Other Ambulatory Visit (INDEPENDENT_AMBULATORY_CARE_PROVIDER_SITE_OTHER): Payer: Medicare HMO

## 2023-03-24 DIAGNOSIS — R509 Fever, unspecified: Secondary | ICD-10-CM

## 2023-03-24 DIAGNOSIS — D72829 Elevated white blood cell count, unspecified: Secondary | ICD-10-CM

## 2023-03-24 DIAGNOSIS — E039 Hypothyroidism, unspecified: Secondary | ICD-10-CM | POA: Diagnosis not present

## 2023-03-24 DIAGNOSIS — R053 Chronic cough: Secondary | ICD-10-CM | POA: Diagnosis not present

## 2023-03-24 LAB — CBC WITH DIFFERENTIAL/PLATELET
Basophils Absolute: 0.1 10*3/uL (ref 0.0–0.1)
Basophils Relative: 0.8 % (ref 0.0–3.0)
Eosinophils Absolute: 0.2 10*3/uL (ref 0.0–0.7)
Eosinophils Relative: 2 % (ref 0.0–5.0)
HCT: 40.3 % (ref 36.0–46.0)
Hemoglobin: 13.4 g/dL (ref 12.0–15.0)
Lymphocytes Relative: 23.8 % (ref 12.0–46.0)
Lymphs Abs: 2.5 10*3/uL (ref 0.7–4.0)
MCHC: 33.2 g/dL (ref 30.0–36.0)
MCV: 92 fl (ref 78.0–100.0)
Monocytes Absolute: 0.8 10*3/uL (ref 0.1–1.0)
Monocytes Relative: 7.3 % (ref 3.0–12.0)
Neutro Abs: 7 10*3/uL (ref 1.4–7.7)
Neutrophils Relative %: 66.1 % (ref 43.0–77.0)
Platelets: 400 10*3/uL (ref 150.0–400.0)
RBC: 4.38 Mil/uL (ref 3.87–5.11)
RDW: 13.8 % (ref 11.5–15.5)
WBC: 10.5 10*3/uL (ref 4.0–10.5)

## 2023-03-24 LAB — BASIC METABOLIC PANEL
BUN: 13 mg/dL (ref 6–23)
CO2: 28 mEq/L (ref 19–32)
Calcium: 9.2 mg/dL (ref 8.4–10.5)
Chloride: 105 mEq/L (ref 96–112)
Creatinine, Ser: 0.74 mg/dL (ref 0.40–1.20)
GFR: 83.77 mL/min (ref >60.00)
Glucose, Bld: 86 mg/dL (ref 70–99)
Potassium: 4.4 mEq/L (ref 3.5–5.1)
Sodium: 140 mEq/L (ref 135–145)

## 2023-03-24 LAB — T4, FREE: Free T4: 1.04 ng/dL (ref 0.60–1.60)

## 2023-03-24 LAB — TSH: TSH: 4.5 u[IU]/mL (ref 0.35–5.50)

## 2023-03-24 NOTE — Telephone Encounter (Signed)
Pt notified of update and verbalized understanding. Pt has been scheduled for lab appt. Orders placed.

## 2023-03-24 NOTE — Telephone Encounter (Signed)
Left message to return phone call.

## 2023-03-24 NOTE — Progress Notes (Signed)
Wbc is better down to normal range , kidney function back to normal and thyroid also back to normal range .     Waiting on the urinalysis .   Should continue to improve   Let me know how doing next week .

## 2023-03-24 NOTE — Addendum Note (Signed)
Addended by: Waymon Amato R on: 03/24/2023 10:48 AM   Modules accepted: Orders

## 2023-03-25 LAB — URINALYSIS, ROUTINE W REFLEX MICROSCOPIC
Bilirubin Urine: NEGATIVE
Hgb urine dipstick: NEGATIVE
Ketones, ur: NEGATIVE
Leukocytes,Ua: NEGATIVE
Nitrite: NEGATIVE
RBC / HPF: NONE SEEN (ref 0–?)
Specific Gravity, Urine: 1.025 (ref 1.000–1.030)
Total Protein, Urine: NEGATIVE
Urine Glucose: NEGATIVE
Urobilinogen, UA: 0.2 (ref 0.0–1.0)
pH: 6 (ref 5.0–8.0)

## 2023-03-25 MED ORDER — PREDNISONE 50 MG PO TABS
50.0000 mg | ORAL_TABLET | Freq: Every day | ORAL | 0 refills | Status: DC
Start: 2023-03-25 — End: 2023-04-08

## 2023-03-25 MED ORDER — DOXYCYCLINE HYCLATE 100 MG PO TABS
100.0000 mg | ORAL_TABLET | Freq: Two times a day (BID) | ORAL | 0 refills | Status: DC
Start: 2023-03-25 — End: 2023-04-08

## 2023-03-25 NOTE — Addendum Note (Signed)
Addended by: Johnella Moloney on: 03/25/2023 04:59 PM   Modules accepted: Orders

## 2023-03-25 NOTE — Telephone Encounter (Signed)
I will send in doxycycline  (antibiotic in case residual bacterial infection )and prednisone  ( steroid antiinflammatory that sometime can help post infectious cough)   You could choose to wait to take it and see if improving but I agree that  may be worth trying meds .   Keep updated for next week

## 2023-03-25 NOTE — Progress Notes (Signed)
Urin shows no evidence of infection or kidney disease reassuring. So since repeat blood results are good  continue to follow and let me know next week how doing

## 2023-03-27 ENCOUNTER — Other Ambulatory Visit: Payer: Medicare HMO

## 2023-03-27 DIAGNOSIS — E039 Hypothyroidism, unspecified: Secondary | ICD-10-CM

## 2023-03-27 DIAGNOSIS — R5383 Other fatigue: Secondary | ICD-10-CM

## 2023-03-27 DIAGNOSIS — R053 Chronic cough: Secondary | ICD-10-CM

## 2023-03-27 DIAGNOSIS — R509 Fever, unspecified: Secondary | ICD-10-CM

## 2023-03-27 LAB — CBC WITH DIFFERENTIAL/PLATELET
Basophils Absolute: 0.1 10*3/uL (ref 0.0–0.1)
Basophils Relative: 0.4 % (ref 0.0–3.0)
Eosinophils Absolute: 0 10*3/uL (ref 0.0–0.7)
Eosinophils Relative: 0.2 % (ref 0.0–5.0)
HCT: 41.5 % (ref 36.0–46.0)
Hemoglobin: 13.4 g/dL (ref 12.0–15.0)
Lymphocytes Relative: 15.5 % (ref 12.0–46.0)
Lymphs Abs: 2.1 10*3/uL (ref 0.7–4.0)
MCHC: 32.3 g/dL (ref 30.0–36.0)
MCV: 93 fl (ref 78.0–100.0)
Monocytes Absolute: 0.6 10*3/uL (ref 0.1–1.0)
Monocytes Relative: 4.4 % (ref 3.0–12.0)
Neutro Abs: 10.6 10*3/uL — ABNORMAL HIGH (ref 1.4–7.7)
Neutrophils Relative %: 79.5 % — ABNORMAL HIGH (ref 43.0–77.0)
Platelets: 386 10*3/uL (ref 150.0–400.0)
RBC: 4.46 Mil/uL (ref 3.87–5.11)
RDW: 14.2 % (ref 11.5–15.5)
WBC: 13.4 10*3/uL — ABNORMAL HIGH (ref 4.0–10.5)

## 2023-03-27 LAB — BASIC METABOLIC PANEL
BUN: 17 mg/dL (ref 6–23)
CO2: 25 mEq/L (ref 19–32)
Calcium: 9.5 mg/dL (ref 8.4–10.5)
Chloride: 105 mEq/L (ref 96–112)
Creatinine, Ser: 0.97 mg/dL (ref 0.40–1.20)
GFR: 60.54 mL/min (ref >60.00)
Glucose, Bld: 120 mg/dL — ABNORMAL HIGH (ref 70–99)
Potassium: 4 mEq/L (ref 3.5–5.1)
Sodium: 139 mEq/L (ref 135–145)

## 2023-03-27 LAB — TSH: TSH: 3.43 u[IU]/mL (ref 0.35–5.50)

## 2023-03-27 LAB — T4, FREE: Free T4: 1.05 ng/dL (ref 0.60–1.60)

## 2023-03-30 ENCOUNTER — Encounter: Payer: Self-pay | Admitting: Internal Medicine

## 2023-03-30 NOTE — Progress Notes (Signed)
Results no sig change x wbc up but we ordered prednisone that could cause this finding . Not sure  why blood tests  were repeated  on 7/5  ( so soon after  7/2)  Hope you are feeling better

## 2023-03-31 NOTE — Telephone Encounter (Signed)
Prednisone can elevate  blood sugar temporarily so  not alarming .  SHould go back to normal    cannot say what the neck throat pain is from . If continues make office visit for examination.

## 2023-04-01 NOTE — Telephone Encounter (Signed)
Possible but dont like that this happened  advise an OV

## 2023-04-08 ENCOUNTER — Ambulatory Visit: Payer: Medicare HMO | Admitting: Internal Medicine

## 2023-04-08 ENCOUNTER — Encounter: Payer: Self-pay | Admitting: Internal Medicine

## 2023-04-08 VITALS — BP 116/70 | HR 74 | Temp 98.3°F | Ht 65.0 in | Wt 147.4 lb

## 2023-04-08 DIAGNOSIS — R053 Chronic cough: Secondary | ICD-10-CM | POA: Diagnosis not present

## 2023-04-08 DIAGNOSIS — R739 Hyperglycemia, unspecified: Secondary | ICD-10-CM

## 2023-04-08 LAB — POCT GLYCOSYLATED HEMOGLOBIN (HGB A1C): Hemoglobin A1C: 5.4 % (ref 4.0–5.6)

## 2023-04-08 NOTE — Patient Instructions (Addendum)
Not sure why you had the facial swelling    consider getting sinus x ray but sine it got better quickly will follow.  WBC count last prob from temporarily from prednisone.  As well as blood sugar.  A1c is 5.4 normal range   Your exam is unremarkable today . Can try plain mucin ex and hydration  to hep move mucous. Activity as tolerated ,   We can  observe for another few weeks   and if not continuing to improve get pulmonary consult and /or lung /chest ct scan.

## 2023-04-08 NOTE — Progress Notes (Signed)
Chief Complaint  Patient presents with   Follow-up    Follow up on cough. Pt reports cough slightly better than before but still bothering her. Finished her antibiotic and prednisone. Denied new sx.  Also would like to discuss on lab result.     HPI: Janet Brock 67 y.o. come in for for resp sx on going cough recnetly rx with pred and doxy   She had one day of left sided face swelling without new pain ( noted by hisgand)  self resolved  no pain teeth or sinus at  that time . At one point had pain back of neck and sore throat but no dysfunction no sob. Had some phlegm green> recnetly in am no hemoptysis  No more fevers or fever feeling but still will have cough and husband said cough badly in sleep one night .  No cp sob  otherwise and energy level is coming back hasn't returned to gym etc.  No doe upstairs.   Asks about resuming  activy   Concern about Blood test results  ( had second draw (  apparently double ordered )  after resolution elevated bg and wbc got her concerned  fam hx of dm and she has never had a problem .   ( This was after beginning ored) ROS: See pertinent positives and negatives per HPI.  Past Medical History:  Diagnosis Date   Anemia    past hx - off Iron   Heart murmur    History of varicella    Hypothyroidism    synthroid  ? hyperthyroid when young and thin and then  dx hypo dx at clinic at work    Migraines    pre menopausal  hx of vicodin  rescue     Family History  Problem Relation Age of Onset   Leukemia Mother    Cancer Mother 53       Breast Cancer   Breast cancer Mother    Alcohol abuse Father    Heart disease Father    Dementia Father    Lymphoma Sister    Thyroid disease Sister    Cancer Maternal Aunt        Breast, Skin   Leukemia Maternal Aunt    Breast cancer Maternal Aunt    Colon cancer Neg Hx    Colon polyps Neg Hx    Esophageal cancer Neg Hx    Rectal cancer Neg Hx    Stomach cancer Neg Hx     Social History    Socioeconomic History   Marital status: Single    Spouse name: Not on file   Number of children: Not on file   Years of education: Not on file   Highest education level: Some college, no degree  Occupational History   Not on file  Tobacco Use   Smoking status: Never   Smokeless tobacco: Never  Vaping Use   Vaping status: Never Used  Substance and Sexual Activity   Alcohol use: Yes    Alcohol/week: 2.0 standard drinks of alcohol    Types: 2 Glasses of wine per week   Drug use: No   Sexual activity: Yes    Partners: Male    Birth control/protection: Surgical  Other Topics Concern   Not on file  Social History Narrative   Receives 7 hours of sleep per night   Lives at home with her partner and sometimes her youngest son     Has 10 dogs (has show  dogs), 1 cat and 5 goats   Works full Research scientist (medical)    NP on site    [phone] - 5  Work  Desk work.    From [place] has lived in [place] and [place].   Mom lives in [place] father lives in [place].   g3 p3   Father passed 2016  alzhiemer prostate cancer age 69   Mom 49 generally well   Social Determinants of Health   Financial Resource Strain: Low Risk  (11/04/2022)   Overall Financial Resource Strain (CARDIA)    Difficulty of Paying Living Expenses: Not hard at all  Food Insecurity: No Food Insecurity (11/04/2022)   Hunger Vital Sign    Worried About Running Out of Food in the Last Year: Never true    Ran Out of Food in the Last Year: Never true  Transportation Needs: No Transportation Needs (11/04/2022)   PRAPARE - Administrator, Civil Service (Medical): No    Lack of Transportation (Non-Medical): No  Physical Activity: Insufficiently Active (11/04/2022)   Exercise Vital Sign    Days of Exercise per Week: 3 days    Minutes of Exercise per Session: 40 min  Stress: No Stress Concern Present (11/04/2022)   [employer] of Occupational Health - Occupational Stress Questionnaire     Feeling of Stress : Only a little  Social Connections: Socially Isolated (11/04/2022)   Social Connection and Isolation Panel [NHANES]    Frequency of Communication with Friends and Family: Once a week    Frequency of Social Gatherings with Friends and Family: Once a week    Attends Religious Services: Never    Database administrator or Organizations: No    Attends Engineer, structural: Not on file    Marital Status: Living with partner    Outpatient Medications Prior to Visit  Medication Sig Dispense Refill   ASPIRIN 81 PO Take 1 tablet by mouth daily. Three x a week     levothyroxine (SYNTHROID) 100 MCG tablet TAKE 1 TABLET BY MOUTH EVERY DAY 90 tablet 2   doxycycline (VIBRA-TABS) 100 MG tablet Take 1 tablet (100 mg total) by mouth 2 (two) times daily. (Patient not taking: Reported on 04/08/2023) 14 tablet 0   predniSONE (DELTASONE) 50 MG tablet Take 1 tablet (50 mg total) by mouth daily with breakfast. (Patient not taking: Reported on 04/08/2023) 5 tablet 0   No facility-administered medications prior to visit.     EXAM:  BP 116/70 (BP Location: Left Arm, Patient Position: Sitting, Cuff Size: Normal)   Pulse 74   Temp 98.3 F (36.8 C) (Oral)   Ht 5\' 5"  (1.651 m)   Wt 147 lb 6.4 oz (66.9 kg)   LMP 12/21/2013   SpO2 94%   BMI 24.53 kg/m   Body mass index is 24.53 kg/m. Ocasss dry cough  nl voice non toxic looks comfortable  GENERAL: vitals reviewed and listed above, alert, oriented, appears well hydrated and in no acute distress no facial swelling  no adenopathy  HEENT: atraumatic, conjunctiva  clear, no obvious abnormalities on inspection of external nose and ears OP : no lesion edema or exudate tmx nl lm clear  NECK: no obvious masses on inspection palpation  LUNGS: clear to auscultation bilaterally, no wheezes, rales or rhonchi, good air movement CV: HRRR, no clubbing cyanosis or  peripheral edema nl cap refill  MS: moves all extremities without noticeable focal   abnormality PSYCH: pleasant and cooperative, no  obvious depression or anxiety Lab Results  Component Value Date   WBC 13.4 (H) 03/27/2023   HGB 13.4 03/27/2023   HCT 41.5 03/27/2023   PLT 386.0 03/27/2023   GLUCOSE 120 (H) 03/27/2023   CHOL 199 06/05/2021   TRIG 55.0 06/05/2021   HDL 76.80 06/05/2021   LDLCALC 111 (H) 06/05/2021   ALT 17 03/18/2023   AST 15 03/18/2023   NA 139 03/27/2023   K 4.0 03/27/2023   CL 105 03/27/2023   CREATININE 0.97 03/27/2023   BUN 17 03/27/2023   CO2 25 03/27/2023   TSH 3.43 03/27/2023   HGBA1C 5.4 04/08/2023   MICROALBUR <0.7 06/14/2021   BP Readings from Last 3 Encounters:  04/08/23 116/70  03/18/23 100/60  12/07/22 103/77  Reviewed lab s and scenario   ASSESSMENT AND PLAN:  Discussed the following assessment and plan:  Cough, persistent  Elevated blood sugar - effect of presnisone  a1c is normal - Plan: POC HgB A1c Episode of facial swelling. Short lived left uncertain cause but doesn't seem like was  infection or vascular related.  Exam is  unrevealing today reassuring   Diff dx of persistent  cough disc  ? If initially exposed to smoke from burning trees bu( but not a  long intensive  exposure)  NO more fevers illness with this .  S/p  antibiotic and short course steroid . Causing  lab effects ( elev wbc and bg)  no need to recheck  these labs as felt  to be benign cause)  Since she is getting better despite persistence ; close observation and   check update report in 2 weeks about how doing .  Evaluate as indicated below.  Overall she looks better  although still coughing  -Patient advised to return or notify health care team  if  new concerns arise.  Patient Instructions  Not sure why you had the facial swelling    consider getting sinus x ray but sine it got better quickly will follow.  WBC count last prob from temporarily from prednisone.  As well as blood sugar.  A1c is 5.4 normal range   Your exam is unremarkable today  . Can try plain mucin ex and hydration  to hep move mucous. Activity as tolerated ,   We can  observe for another few weeks   and if not continuing to improve get pulmonary consult and /or lung /chest ct scan.   Neta Mends. Kymorah Korf M.D.

## 2023-04-10 ENCOUNTER — Telehealth: Payer: Self-pay

## 2023-04-10 NOTE — Telephone Encounter (Signed)
Called at 1:00, 1:15 & 1:20. Unable to reach patient for pre visit.  Mailbox is full and cannot leave a message. Will wait until 5pm to see if she calls back before cancelling PV and colonoscopy.

## 2023-04-10 NOTE — Telephone Encounter (Signed)
Patient was rescheduled for PV on 7/25 at 10:30

## 2023-04-16 ENCOUNTER — Ambulatory Visit (AMBULATORY_SURGERY_CENTER): Payer: Medicare HMO

## 2023-04-16 ENCOUNTER — Other Ambulatory Visit: Payer: Self-pay | Admitting: Gastroenterology

## 2023-04-16 VITALS — Ht 65.0 in | Wt 145.0 lb

## 2023-04-16 DIAGNOSIS — Z86010 Personal history of colonic polyps: Secondary | ICD-10-CM

## 2023-04-16 DIAGNOSIS — Z01419 Encounter for gynecological examination (general) (routine) without abnormal findings: Secondary | ICD-10-CM | POA: Diagnosis not present

## 2023-04-16 MED ORDER — SUTAB 1479-225-188 MG PO TABS
12.0000 | ORAL_TABLET | ORAL | 0 refills | Status: DC
Start: 2023-04-16 — End: 2023-05-11

## 2023-04-16 NOTE — Progress Notes (Signed)
No egg or soy allergy known to patient  No issues known to pt with past sedation with any surgeries or procedures Patient denies ever being told they had issues or difficulty with intubation  No FH of Malignant Hyperthermia Pt is not on diet pills Pt is not on  home 02  Pt is not on blood thinners  Pt denies issues with constipation  No A fib or A flutter Have any cardiac testing pending--no  LOA: independently  Prep: sutab with extra miralax per request patient has not tolerated suprep with last two procedures  Patient's chart reviewed by Cathlyn Parsons CNRA prior to previsit and patient appropriate for the [facility].  Previsit completed and red dot placed by patient's name on their procedure day (on provider's schedule).     PV competed with patient. Prep instructions sent via mychart and home address. Sutab coupom provided to use for price reduction if needed.

## 2023-04-16 NOTE — Telephone Encounter (Addendum)
Spoke with pharmacy. Patient is aware that her insurance would not cover the prep. Pt is ok with paying out of pocket for SUTAB this was discussed with the patient at her PV apt. Pt has difficulties keeping down the liquid preps. SUTAB coupon given to patient for price reduction. Spoke with Fleet Contras at the [facility] in [place] she will go ahead and get the rx prepared.

## 2023-05-09 ENCOUNTER — Encounter: Payer: Self-pay | Admitting: Certified Registered Nurse Anesthetist

## 2023-05-11 ENCOUNTER — Ambulatory Visit (AMBULATORY_SURGERY_CENTER): Payer: Medicare HMO | Admitting: Gastroenterology

## 2023-05-11 ENCOUNTER — Encounter: Payer: Self-pay | Admitting: Gastroenterology

## 2023-05-11 VITALS — BP 115/70 | HR 54 | Temp 97.3°F | Resp 11 | Ht 65.0 in | Wt 145.0 lb

## 2023-05-11 DIAGNOSIS — K621 Rectal polyp: Secondary | ICD-10-CM | POA: Diagnosis not present

## 2023-05-11 DIAGNOSIS — E039 Hypothyroidism, unspecified: Secondary | ICD-10-CM | POA: Diagnosis not present

## 2023-05-11 DIAGNOSIS — D124 Benign neoplasm of descending colon: Secondary | ICD-10-CM

## 2023-05-11 DIAGNOSIS — D128 Benign neoplasm of rectum: Secondary | ICD-10-CM

## 2023-05-11 DIAGNOSIS — D122 Benign neoplasm of ascending colon: Secondary | ICD-10-CM | POA: Diagnosis not present

## 2023-05-11 DIAGNOSIS — K5520 Angiodysplasia of colon without hemorrhage: Secondary | ICD-10-CM | POA: Diagnosis not present

## 2023-05-11 DIAGNOSIS — Z09 Encounter for follow-up examination after completed treatment for conditions other than malignant neoplasm: Secondary | ICD-10-CM | POA: Diagnosis not present

## 2023-05-11 DIAGNOSIS — Z86010 Personal history of colonic polyps: Secondary | ICD-10-CM | POA: Diagnosis not present

## 2023-05-11 DIAGNOSIS — K635 Polyp of colon: Secondary | ICD-10-CM

## 2023-05-11 DIAGNOSIS — D123 Benign neoplasm of transverse colon: Secondary | ICD-10-CM | POA: Diagnosis not present

## 2023-05-11 DIAGNOSIS — D120 Benign neoplasm of cecum: Secondary | ICD-10-CM | POA: Diagnosis not present

## 2023-05-11 MED ORDER — SODIUM CHLORIDE 0.9 % IV SOLN: 500.0000 mL | Freq: Once | INTRAVENOUS | Status: DC

## 2023-05-11 NOTE — Progress Notes (Signed)
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 female  here for colonoscopy surveillance. 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 Last exam 04/2022, somewhat limited by prep.    Patient denies any bowel symptoms at this time. Otherwise feels well without any cardiopulmonary symptoms.   I have discussed risks / benefits of anesthesia and endoscopic procedure with Mack Guise and they wish to proceed with the exams as outlined today.    Past Medical History:  Diagnosis Date   Anemia    past hx - off Iron   Heart murmur    History of varicella    Hypothyroidism    synthroid  ? hyperthyroid when young and thin and then  dx hypo dx at clinic at work    Migraines    pre menopausal  hx of vicodin  rescue     Past Surgical History:  Procedure Laterality Date   ABLATION     ? year    btl     CATARACT EXTRACTION Bilateral    12/06/2021, 12/20/2021   COLONOSCOPY     approx 2 years ago   HYSTEROSCOPY  05/08/2011   Procedure: HYSTEROSCOPY WITH HYDROTHERMAL ABLATION;  Surgeon: Levi Aland;  Location: [facility];  Service: Gynecology;  Laterality: N/A;   svd       x 2   WISDOM TOOTH EXTRACTION      Prior to Admission medications   Medication Sig Start Date End Date Taking? Authorizing Provider  acetaminophen (TYLENOL) 325 MG tablet Take 650 mg by mouth every 6 (six) hours as needed.   Yes [provider]  ASPIRIN 81 PO Take 1 tablet by mouth daily. Three x a week   Yes [provider]  levothyroxine (SYNTHROID) 100 MCG tablet TAKE 1 TABLET BY MOUTH EVERY DAY 01/12/23  Yes Eulis Foster, FNP    Current Outpatient Medications  Medication Sig Dispense Refill   acetaminophen (TYLENOL) 325 MG tablet Take 650 mg  by mouth every 6 (six) hours as needed.     ASPIRIN 81 PO Take 1 tablet by mouth daily. Three x a week     levothyroxine (SYNTHROID) 100 MCG tablet TAKE 1 TABLET BY MOUTH EVERY DAY 90 tablet 2   Current Facility-Administered Medications  Medication Dose Route Frequency Provider Last Rate Last Admin   0.9 %  sodium chloride infusion  500 mL Intravenous Once Chais Fehringer, Willaim Rayas, MD        Allergies as of 05/11/2023 - Review Complete 05/11/2023  Allergen Reaction Noted   Sumatriptan Shortness Of Breath and Other (See Comments) 06/22/2007   Clavulanic acid Nausea And Vomiting 05/11/2023    Family History  Problem Relation Age of Onset   Leukemia Mother    Cancer Mother 9       Breast Cancer   Breast cancer Mother    Alcohol abuse Father    Heart disease Father    Dementia Father    Lymphoma Sister    Thyroid disease Sister    Cancer Maternal Aunt        Breast, Skin   Leukemia Maternal Aunt    Breast cancer Maternal Aunt    Colon cancer Neg Hx    Colon polyps  Neg Hx    Esophageal cancer Neg Hx    Rectal cancer Neg Hx    Stomach cancer Neg Hx     Social History   Socioeconomic History   Marital status: Single    Spouse name: Not on file   Number of children: Not on file   Years of education: Not on file   Highest education level: Some college, no degree  Occupational History   Not on file  Tobacco Use   Smoking status: Never   Smokeless tobacco: Never  Vaping Use   Vaping status: Never Used  Substance and Sexual Activity   Alcohol use: Yes    Alcohol/week: 2.0 standard drinks of alcohol    Types: 2 Glasses of wine per week   Drug use: No   Sexual activity: Yes    Partners: Male    Birth control/protection: Surgical  Other Topics Concern   Not on file  Social History Narrative   Receives 7 hours of sleep per night   Lives at home with her partner and sometimes her youngest son     Has 10 dogs (has show dogs), 1 cat and 5 goats   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    From [place] has lived in [place] and [place].   Mom lives in [place] father lives in [place].   g3 p3   Father passed 2016  alzhiemer prostate cancer age 85   Mom 57 generally well   Social Determinants of Health   Financial Resource Strain: Low Risk  (11/04/2022)   Overall Financial Resource Strain (CARDIA)    Difficulty of Paying Living Expenses: Not hard at all  Food Insecurity: No Food Insecurity (11/04/2022)   Hunger Vital Sign    Worried About Running Out of Food in the Last Year: Never true    Ran Out of Food in the Last Year: Never true  Transportation Needs: No Transportation Needs (11/04/2022)   PRAPARE - Administrator, Civil Service (Medical): No    Lack of Transportation (Non-Medical): No  Physical Activity: Insufficiently Active (11/04/2022)   Exercise Vital Sign    Days of Exercise per Week: 3 days    Minutes of Exercise per Session: 40 min  Stress: No Stress Concern Present (11/04/2022)   [employer] of Occupational Health - Occupational Stress Questionnaire    Feeling of Stress : Only a little  Social Connections: Socially Isolated (11/04/2022)   Social Connection and Isolation Panel [NHANES]    Frequency of Communication with Friends and Family: Once a week    Frequency of Social Gatherings with Friends and Family: Once a week    Attends Religious Services: Never    Database administrator or Organizations: No    Attends Engineer, structural: Not on file    Marital Status: Living with partner  Intimate Partner Violence: Not on file    Review of Systems: All other review of systems negative except as mentioned in the HPI.  Physical Exam: Vital signs BP 132/72   Pulse (!) 58   Temp (!) 97.3 F (36.3 C) (Temporal)   Ht 5\' 5"  (1.651 m)   Wt 145 lb (65.8 kg)   LMP 12/21/2013   SpO2 99%   BMI 24.13 kg/m   General:   Alert,  Well-developed, pleasant and  cooperative in NAD Lungs:  Clear throughout to  auscultation.   Heart:  Regular rate and rhythm Abdomen:  Soft, nontender and nondistended.   Neuro/Psych:  Alert and cooperative. Normal mood and affect. A and O x 3  Harlin Rain, MD [facility]

## 2023-05-11 NOTE — Progress Notes (Signed)
Called to room to assist during endoscopic procedure.  Patient ID and intended procedure confirmed with present staff. Received instructions for my participation in the procedure from the performing physician.  

## 2023-05-11 NOTE — Progress Notes (Signed)
Pt's states no medical or surgical changes since previsit or office visit. 

## 2023-05-11 NOTE — Patient Instructions (Signed)
Handouts provided for hemorrhoids, diverticulosis and polyps.  Resume previous diet.  Continue present medications.  Await pathology results.  YOU HAD AN ENDOSCOPIC PROCEDURE TODAY AT THE [facility]:   Refer to the procedure report that was given to you for any specific questions about what was found during the examination.  If the procedure report does not answer your questions, please call your gastroenterologist to clarify.  If you requested that your care partner not be given the details of your procedure findings, then the procedure report has been included in a sealed envelope for you to review at your convenience later.  YOU SHOULD EXPECT: Some feelings of bloating in the abdomen. Passage of more gas than usual.  Walking can help get rid of the air that was put into your GI tract during the procedure and reduce the bloating. If you had a lower endoscopy (such as a colonoscopy or flexible sigmoidoscopy) you may notice spotting of blood in your stool or on the toilet paper. If you underwent a bowel prep for your procedure, you may not have a normal bowel movement for a few days.  Please Note:  You might notice some irritation and congestion in your nose or some drainage.  This is from the oxygen used during your procedure.  There is no need for concern and it should clear up in a day or so.  SYMPTOMS TO REPORT IMMEDIATELY:  Following lower endoscopy (colonoscopy or flexible sigmoidoscopy):  Excessive amounts of blood in the stool  Significant tenderness or worsening of abdominal pains  Swelling of the abdomen that is new, acute  Fever of 100F or higher For urgent or emergent issues, a gastroenterologist can be reached at any hour by calling [phone]. Do not use MyChart messaging for urgent concerns.    DIET:  We do recommend a small meal at first, but then you may proceed to your regular diet.  Drink plenty of fluids but you should avoid alcoholic beverages for 24  hours.  ACTIVITY:  You should plan to take it easy for the rest of today and you should NOT DRIVE or use heavy machinery until tomorrow (because of the sedation medicines used during the test).    FOLLOW UP: Our staff will call the number listed on your records the next business day following your procedure.  We will call around 7:15- 8:00 am to check on you and address any questions or concerns that you may have regarding the information given to you following your procedure. If we do not reach you, we will leave a message.     If any biopsies were taken you will be contacted by phone or by letter within the next 1-3 weeks.  Please call us at [phone] if you have not heard about the biopsies in 3 weeks.    SIGNATURES/CONFIDENTIALITY: You and/or your care partner have signed paperwork which will be entered into your electronic medical record.  These signatures attest to the fact that that the information above on your After Visit Summary has been reviewed and is understood.  Full responsibility of the confidentiality of this discharge information lies with you and/or your care-partner.

## 2023-05-11 NOTE — Progress Notes (Signed)
Report given to PACU, vss 

## 2023-05-11 NOTE — Op Note (Signed)
[facility] Patient Name: Janet Brock Procedure Date: 05/11/2023 7:52 AM MRN: [id number] Endoscopist: Viviann Spare P. Adela Lank , MD, [id number] Age: 67 Referring MD:  Date of Birth: 1955/11/05 Gender: Female Account #: [id number] Procedure:                Colonoscopy Indications:              High risk colon cancer surveillance: Personal                            history of Serrated Polyposis Syndrome Medicines:                Monitored Anesthesia Care Procedure:                Pre-Anesthesia Assessment:                           - Prior to the procedure, a History and Physical                            was performed, and patient medications and                            allergies were reviewed. The patient's tolerance of                            previous anesthesia was also reviewed. The risks                            and benefits of the procedure and the sedation                            options and risks were discussed with the patient.                            All questions were answered, and informed consent                            was obtained. Prior Anticoagulants: The patient has                            taken no anticoagulant or antiplatelet agents. ASA                            Grade Assessment: II - A patient with mild systemic                            disease. After reviewing the risks and benefits,                            the patient was deemed in satisfactory condition to                            undergo the procedure.  After obtaining informed consent, the colonoscope                            was passed under direct vision. Throughout the                            procedure, the patient's blood pressure, pulse, and                            oxygen saturations were monitored continuously. The                            Olympus Scope [id number] was introduced through the                            anus and  advanced to the the cecum, identified by                            appendiceal orifice and ileocecal valve. The                            colonoscopy was performed without difficulty. The                            patient tolerated the procedure well. The quality                            of the bowel preparation was good. The ileocecal                            valve, appendiceal orifice, and rectum were                            photographed. Scope In: 8:07:55 AM Scope Out: 8:30:19 AM Scope Withdrawal Time: 0 hours 18 minutes 55 seconds  Total Procedure Duration: 0 hours 22 minutes 24 seconds  Findings:                 The perianal and digital rectal examinations were                            normal.                           A few small angiodysplastic lesions were found in                            the cecum.                           A 3 mm polyp was found in the cecum. The polyp was                            flat. The polyp was removed with a cold snare.  Resection and retrieval were complete.                           A 3 mm polyp was found in the ascending colon. The                            polyp was flat. The polyp was removed with a cold                            snare. Resection and retrieval were complete.                           A 6 to 7 mm polyp was found in the transverse                            colon. The polyp was flat. The polyp was removed                            with a cold snare. Resection and retrieval were                            complete.                           A tattoo was seen in the transverse colon.                           An 8 mm polyp was found in the descending colon.                            The polyp was flat. The polyp was removed with a                            cold snare. Resection and retrieval were complete.                           A few small-mouthed diverticula were found in the                             sigmoid colon.                           Two flat polyps were found in the rectum. The                            polyps were 3 mm in size. These polyps were removed                            with a cold snare. Resection and retrieval were                            complete.  Internal hemorrhoids were found during                            retroflexion. The hemorrhoids were small.                           The exam was otherwise without abnormality. Complications:            No immediate complications. Estimated blood loss:                            Minimal. Estimated Blood Loss:     Estimated blood loss was minimal. Impression:               - A few colonic angiodysplastic lesions.                           - One 3 mm polyp in the cecum, removed with a cold                            snare. Resected and retrieved.                           - One 3 mm polyp in the ascending colon, removed                            with a cold snare. Resected and retrieved.                           - One 6 to 7 mm polyp in the transverse colon,                            removed with a cold snare. Resected and retrieved.                           - A tattoo was seen in the transverse colon.                           - One 8 mm polyp in the descending colon, removed                            with a cold snare. Resected and retrieved.                           - Diverticulosis in the sigmoid colon.                           - Two 3 mm polyps in the rectum, removed with a                            cold snare. Resected and retrieved.                           - Internal hemorrhoids.                           -  The examination was otherwise normal. Recommendation:           - Patient has a contact number available for                            emergencies. The signs and symptoms of potential                            delayed complications were discussed with  the                            patient. Return to normal activities tomorrow.                            Written discharge instructions were provided to the                            patient.                           - Resume previous diet.                           - Continue present medications.                           - Await pathology results. Viviann Spare P. Lancer Thurner, MD 05/11/2023 8:41:40 AM This report has been signed electronically.

## 2023-05-12 NOTE — Telephone Encounter (Signed)
Left message on f/u call 

## 2023-06-02 NOTE — Progress Notes (Unsigned)
No chief complaint on file.   HPI: Janet Brock 67 y.o. come in for  ROS: See pertinent positives and negatives per HPI.  Past Medical History:  Diagnosis Date   Anemia    past hx - off Iron   Heart murmur    History of varicella    Hypothyroidism    synthroid  ? hyperthyroid when young and thin and then  dx hypo dx at clinic at work    Migraines    pre menopausal  hx of vicodin  rescue     Family History  Problem Relation Age of Onset   Leukemia Mother    Cancer Mother 34       Breast Cancer   Breast cancer Mother    Alcohol abuse Father    Heart disease Father    Dementia Father    Lymphoma Sister    Thyroid disease Sister    Cancer Maternal Aunt        Breast, Skin   Leukemia Maternal Aunt    Breast cancer Maternal Aunt    Colon cancer Neg Hx    Colon polyps Neg Hx    Esophageal cancer Neg Hx    Rectal cancer Neg Hx    Stomach cancer Neg Hx     Social History   Socioeconomic History   Marital status: Single    Spouse name: Not on file   Number of children: Not on file   Years of education: Not on file   Highest education level: Some college, no degree  Occupational History   Not on file  Tobacco Use   Smoking status: Never   Smokeless tobacco: Never  Vaping Use   Vaping status: Never Used  Substance and Sexual Activity   Alcohol use: Yes    Alcohol/week: 2.0 standard drinks of alcohol    Types: 2 Glasses of wine per week   Drug use: No   Sexual activity: Yes    Partners: Male    Birth control/protection: Surgical  Other Topics Concern   Not on file  Social History Narrative   Receives 7 hours of sleep per night   Lives at home with her partner and sometimes her youngest son     Has 10 dogs (has show dogs), 1 cat and 5 goats   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    From [place] has lived in [place] and [place].   Mom lives in [place] father lives in [place].   g3 p3    Father passed 2016  alzhiemer prostate cancer age 64   Mom 47 generally well   Social Determinants of Health   Financial Resource Strain: Low Risk  (11/04/2022)   Overall Financial Resource Strain (CARDIA)    Difficulty of Paying Living Expenses: Not hard at all  Food Insecurity: No Food Insecurity (11/04/2022)   Hunger Vital Sign    Worried About Running Out of Food in the Last Year: Never true    Ran Out of Food in the Last Year: Never true  Transportation Needs: No Transportation Needs (11/04/2022)   PRAPARE - Administrator, Civil Service (Medical): No    Lack of Transportation (Non-Medical): No  Physical Activity: Insufficiently Active (11/04/2022)   Exercise Vital Sign    Days of Exercise per Week: 3 days    Minutes of Exercise per Session: 40 min  Stress: No Stress Concern Present (11/04/2022)   [employer] of Occupational Health - Occupational Stress Questionnaire    Feeling of Stress : Only a little  Social Connections: Socially Isolated (11/04/2022)   Social Connection and Isolation Panel [NHANES]    Frequency of Communication with Friends and Family: Once a week    Frequency of Social Gatherings with Friends and Family: Once a week    Attends Religious Services: Never    Database administrator or Organizations: No    Attends Engineer, structural: Not on file    Marital Status: Living with partner    Outpatient Medications Prior to Visit  Medication Sig Dispense Refill   acetaminophen (TYLENOL) 325 MG tablet Take 650 mg by mouth every 6 (six) hours as needed.     ASPIRIN 81 PO Take 1 tablet by mouth daily. Three x a week     levothyroxine (SYNTHROID) 100 MCG tablet TAKE 1 TABLET BY MOUTH EVERY DAY 90 tablet 2   No facility-administered medications prior to visit.     EXAM:  LMP 12/21/2013   There is no height or weight on file to calculate BMI.  GENERAL: vitals reviewed and listed above, alert, oriented, appears well hydrated and in no  acute distress HEENT: atraumatic, conjunctiva  clear, no obvious abnormalities on inspection of external nose and ears OP : no lesion edema or exudate  NECK: no obvious masses on inspection palpation  LUNGS: clear to auscultation bilaterally, no wheezes, rales or rhonchi, good air movement CV: HRRR, no clubbing cyanosis or  peripheral edema nl cap refill  MS: moves all extremities without noticeable focal  abnormality PSYCH: pleasant and cooperative, no obvious depression or anxiety Lab Results  Component Value Date   WBC 13.4 (H) 03/27/2023   HGB 13.4 03/27/2023   HCT 41.5 03/27/2023   PLT 386.0 03/27/2023   GLUCOSE 120 (H) 03/27/2023   CHOL 199 06/05/2021   TRIG 55.0 06/05/2021   HDL 76.80 06/05/2021   LDLCALC 111 (H) 06/05/2021   ALT 17 03/18/2023   AST 15 03/18/2023   NA 139 03/27/2023   K 4.0 03/27/2023   CL 105 03/27/2023   CREATININE 0.97 03/27/2023   BUN 17 03/27/2023   CO2 25 03/27/2023   TSH 3.43 03/27/2023   HGBA1C 5.4 04/08/2023   MICROALBUR <0.7 06/14/2021   BP Readings from Last 3 Encounters:  05/11/23 115/70  04/08/23 116/70  03/18/23 100/60    ASSESSMENT AND PLAN:  Discussed the following assessment and plan:  No diagnosis found.  -Patient advised to return or notify health care team  if  new concerns arise.  There are no Patient Instructions on file for this visit.   Neta Mends. Emmalena Canny M.D.

## 2023-06-03 ENCOUNTER — Other Ambulatory Visit: Payer: Self-pay

## 2023-06-03 ENCOUNTER — Encounter: Payer: Self-pay | Admitting: Internal Medicine

## 2023-06-03 ENCOUNTER — Ambulatory Visit (INDEPENDENT_AMBULATORY_CARE_PROVIDER_SITE_OTHER): Payer: Medicare HMO

## 2023-06-03 ENCOUNTER — Ambulatory Visit (INDEPENDENT_AMBULATORY_CARE_PROVIDER_SITE_OTHER): Payer: Medicare HMO | Admitting: Internal Medicine

## 2023-06-03 ENCOUNTER — Encounter: Payer: Self-pay | Admitting: Family Medicine

## 2023-06-03 ENCOUNTER — Ambulatory Visit: Payer: Medicare HMO | Admitting: Family Medicine

## 2023-06-03 VITALS — BP 146/82 | HR 54 | Ht 65.0 in | Wt 148.0 lb

## 2023-06-03 VITALS — BP 146/82 | HR 54 | Temp 98.1°F | Ht 65.0 in | Wt 148.0 lb

## 2023-06-03 DIAGNOSIS — M25461 Effusion, right knee: Secondary | ICD-10-CM | POA: Diagnosis not present

## 2023-06-03 DIAGNOSIS — M25561 Pain in right knee: Secondary | ICD-10-CM

## 2023-06-03 DIAGNOSIS — M79661 Pain in right lower leg: Secondary | ICD-10-CM | POA: Diagnosis not present

## 2023-06-03 DIAGNOSIS — M7121 Synovial cyst of popliteal space [Baker], right knee: Secondary | ICD-10-CM

## 2023-06-03 DIAGNOSIS — R030 Elevated blood-pressure reading, without diagnosis of hypertension: Secondary | ICD-10-CM | POA: Diagnosis not present

## 2023-06-03 NOTE — Patient Instructions (Addendum)
Thank you for coming in today.   Please get an Xray today before you leave   Please complete the exercises that the athletic trainer went over with you:  View at [email or web address] using code: [email or web address]  I've referred you to Physical Therapy.  Let us know if you don't hear from them in one week.   Recheck in about 6 weeks.

## 2023-06-03 NOTE — Progress Notes (Signed)
I, Stevenson Clinch, CMA acting as a scribe for Clementeen Graham, MD.  Janet Brock is a 67 y.o. female who presents to [facility] at [facility] today for R knee pain ongoing for about 2 wks. Pain started when she was at a dog show, suddenly she could barely walk. Pt locates pain to posterior aspect of the knee. Denies increased warmth or erythema. Some pain in the right thigh and calf. Denies mechanical sx.   Pain is predominantly located at the posterior calf radiating from the knee down.  Pain is worse with climbing stairs.  She does not have much pain standing and walking around normally.  She does not have pain at night.  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  R Knee swelling: yes Mechanical symptoms: no Radiates: leg Aggravates: flexion, ascending stairs Treatments tried: heat, IBU  Pertinent review of systems: No fevers or chills  Relevant historical information: Hypothyroidism   Exam:  BP (!) 146/82   Pulse (!) 54   Ht 5\' 5"  (1.651 m)   Wt 148 lb (67.1 kg)   LMP 12/21/2013   BMI 24.63 kg/m  General: Well Developed, well nourished, and in no acute distress.   MSK: Right knee no significant effusion.  Normal-appearing Normal motion. Nontender. Stable ligamentous exam. Intact strength.  Right calf normal-appearing no swelling or erythema.  Calf is nontender to palpation.  Negative calf squeeze test.  Intact strength.    Lab and Radiology Results  Diagnostic Limited MSK Ultrasound of: Right knee and proximal calf Quad tendon intact normal-appearing Mild joint effusion superior patellar space. Patellar tendon normal. Medial lateral joint line relatively normal. Posterior knee no Baker's cyst.  No abnormality visible in the proximal calf Impression: Mild joint effusion.  X-ray images right knee obtained today personally and independently interpreted Minimal degenerative changes.   No acute fractures are visible. Await formal radiology review  Assessment and Plan: 67 y.o. female with right leg pain predominantly located at the posterior knee to the posterior calf.  Pain seems to be worse when she climbs stairs which would use the calf muscle group.  I think the most likely diagnosis is Overuse injury.  There could be a component of radiculopathy.  DVT is extremely unlikely given normal appearance of calf without swelling or erythema or induration.  After discussion plan for trial of physical therapy.  Recheck in 4 to 6 weeks. Home exercise taught in clinic today prior to discharge.  PDMP not reviewed this encounter. Orders Placed This Encounter  Procedures   [place] LIMITED JOINT SPACE STRUCTURES LOW RIGHT(NO LINKED CHARGES)    Order Specific Question:   Reason for Exam (SYMPTOM  OR DIAGNOSIS REQUIRED)    Answer:   right knee pain    Order Specific Question:   Preferred imaging location?    Answer:   [facility] Knee AP/LAT W/Sun[place] Right    Standing Status:   Future    Number of Occurrences:   1    Standing Expiration Date:   07/03/2023    Order Specific Question:   Reason for Exam (SYMPTOM  OR DIAGNOSIS REQUIRED)    Answer:   right knee pain    Order Specific Question:   Preferred imaging location?    Answer:   [facility]   Ambulatory referral to Physical Therapy    Referral Priority:  Routine    Referral Type:   Physical Medicine    Referral Reason:   Specialty Services Required    Requested Specialty:   Physical Therapy    Number of Visits Requested:   1   No orders of the defined types were placed in this encounter.    Discussed warning signs or symptoms. Please see discharge instructions. Patient expresses understanding.   The above documentation has been reviewed and is accurate and complete Clementeen Graham, M.D.

## 2023-06-03 NOTE — Patient Instructions (Addendum)
Thinking a bakers cyst  or such related to knee. Roe Coombs' t  think vein vascular  but may need Ultrasound and other imagine to define .   Referring  as urgent to Sport medicine .   As possible

## 2023-06-10 ENCOUNTER — Ambulatory Visit: Payer: Medicare HMO | Admitting: Gastroenterology

## 2023-06-16 ENCOUNTER — Encounter: Payer: Self-pay | Admitting: Family Medicine

## 2023-06-16 NOTE — Telephone Encounter (Signed)
Forwarding to Dr. Denyse Amass to advise.

## 2023-06-16 NOTE — Progress Notes (Signed)
Right knee x-ray looks okay to radiology.

## 2023-06-29 ENCOUNTER — Ambulatory Visit: Payer: Medicare HMO | Attending: Family Medicine

## 2023-06-29 DIAGNOSIS — M25561 Pain in right knee: Secondary | ICD-10-CM | POA: Insufficient documentation

## 2023-06-29 DIAGNOSIS — M79604 Pain in right leg: Secondary | ICD-10-CM | POA: Diagnosis not present

## 2023-06-29 DIAGNOSIS — R29898 Other symptoms and signs involving the musculoskeletal system: Secondary | ICD-10-CM | POA: Diagnosis not present

## 2023-06-29 DIAGNOSIS — M79661 Pain in right lower leg: Secondary | ICD-10-CM | POA: Diagnosis not present

## 2023-06-29 NOTE — Therapy (Signed)
OUTPATIENT PHYSICAL THERAPY LOWER EXTREMITY EVALUATION 06/29/23   Patient Name: Janet Brock MRN: [id number] DOB:Apr 16, 1956, 67 y.o., female Today's Date: 07/01/2023  END OF SESSION:  PT End of Session - 06/30/23 0857     Visit Number 1    Number of Visits 13    Date for PT Re-Evaluation 08/10/23    Authorization Type 2x/wk x 6 weeks, recert 08/10/23    Activity Tolerance Patient tolerated treatment well    Behavior During Therapy [facility] for tasks assessed/performed             Past Medical History:  Diagnosis Date   Anemia    past hx - off Iron   Heart murmur    History of varicella    Hypothyroidism    synthroid  ? hyperthyroid when young and thin and then  dx hypo dx at clinic at work    Migraines    pre menopausal  hx of vicodin  rescue    Past Surgical History:  Procedure Laterality Date   ABLATION     ? year    btl     CATARACT EXTRACTION Bilateral    12/06/2021, 12/20/2021   COLONOSCOPY     approx 2 years ago   HYSTEROSCOPY  05/08/2011   Procedure: HYSTEROSCOPY WITH HYDROTHERMAL ABLATION;  Surgeon: Levi Aland;  Location: [facility];  Service: Gynecology;  Laterality: N/A;   svd       x 2   WISDOM TOOTH EXTRACTION     Patient Active Problem List   Diagnosis Date Noted   Breast lump 05/19/2018   Greater trochanteric bursitis of right hip 11/14/2016   Hypothyroidism     PCP: Dr. Fabian Sharp, MD  REFERRING PROVIDER: Dr. Denyse Amass, MD  REFERRING DIAG:  [phone] (ICD-10-CM) - Right knee pain, unspecified chronicity  M79.661 (ICD-10-CM) - Right calf pain    THERAPY DIAG:  Pain in right leg  Right knee pain, unspecified chronicity  Weakness of right lower extremity  Rationale for Evaluation and Treatment: Rehabilitation  ONSET DATE: Labor day weekend 2024  SUBJECTIVE:   SUBJECTIVE STATEMENT: Pt states she was showing dogs in [place] in August 2024; she travels in a big [place] with her dogs- has a step stool to get in and out of the [place].  She started noticing  her R leg was hurting on that trip.  By 5 days into the trip her leg pain progressively worsened and she was having difficulty walking.  Feels "phantom" pain.  She did not have a MOI.  Onset was gradual.  Overall sx are improving.      She has not had this type of sensation/pain before.  No back pain with the sx.  No L leg sx.  Not tingling, but sx shift around   Location: anterior/lateral R hip, sometimes posterior R thigh/knee/calf pain- posterior knee initially was the worst- getting better  She saw her PCP for a consult when she got back and also sports medicine MD the same day.  They did an ultrasound, x-rays and everything was (-).    She got a few exercises: heel drops, soleus stretch, gastroc stretch.    Agg: going up stairs is hard if her leg is tired; walking/jogging  Allev: topical lidocaine patches- helps; has not used them in a few days.  She has not tried NSAIDs.  Being upright feels better.    Has tried 1 chiropractor tx- no change.    She does not notice that weight bearing specifically  influences her sx- maybe more in the lower leg.  Feels good lying.   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  Has to be able to work with dogs that are up to 70 lbs.  Has to be able to lift into the upper crate in the [place]. Has to run counterclockwise.    Enjoys being active in her yard, property   PERTINENT HISTORY: She shows dogs as a professional hobby She goes to the gym 3x/week- does machines, not doing typical workouts currently bc of sx  PAIN:  Are you having pain? Yes; current 0/10; worst 10/10 (early September- Labor Day); in the past week 2-3/10 at worst   PRECAUTIONS: None  RED FLAGS: None  WEIGHT BEARING RESTRICTIONS: No  FALLS:  Has patient fallen in last 6 months? 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  DIAGNOSTIC FINDINGS: had x-ray and [place] of knee, both (-)  PATIENT SURVEYS:  FOTO 72/81  COGNITION: Overall cognitive status: Within functional limits for tasks assessed      POSTURE: No Significant postural limitations  Pt able to like supine.  (-) R FADDIR today  PALPATION:  (+)TTP R greater trochanter, (-) TTP R knee soft tissue/mm  LOWER EXTREMITY ROM:  Active ROM Right eval Left eval  Hip flexion normal normal  Hip extension    Hip abduction    Hip adduction    Hip internal rotation 15 deg, 35 deg, Pt notes discomfort with OP normal  Hip external rotation Pt notes discomfort with OP normal  Knee flexion Normal and pain free OP  normal  Knee extension Normal and pain free OP normal  Ankle dorsiflexion    Ankle plantarflexion    Ankle inversion    Ankle eversion     (Blank rows = not tested) Further motion testing of hip will be performed at future visit LOWER EXTREMITY MMT:  MMT (out of 5) Right eval Left eval  Hip flexion    Hip extension    Hip abduction    Hip adduction    Hip internal rotation 4/5Pt notes discomfort 5  Hip external rotation 4/5 Pt notes discomfort 5  Knee flexion 5 5  Knee extension 5 5  Ankle dorsiflexion    Ankle plantarflexion    Ankle inversion    Ankle eversion     (Blank rows = not tested)  LOWER EXTREMITY SPECIAL TESTS:  Full lower quarter neuro screen deferred to next visit  due to time constraints as pt arrived late to apt today (-) lumbar extension/rotation test R and L for reproduction of sx  FUNCTIONAL TESTS:  (+) R LE sx pain with squat, (+) impaired R SLS motor control compared to R, (+) R lateral hip pain with jogging counter clockwise how she has to  show dogs  LUMBAR AROM:  (-) reproduction of pain with lumbar spine AROM flexion, extension, side bend, rotation and (-) with OP.  Further PAM deferred to visit #2 due to time constraints.   TODAY'S TREATMENT:                                                                                                                              DATE: 07/06/23   Initial evaluation performed. Further testing will be performed at visit #2.   Pt to trial repeated lumbar spine motions for HEP as her sx appear low in irritability; was not able to fully rule out lumbar spine as pain generator vs hip today.   PATIENT EDUCATION:  Education details: PT POC/goals Person educated: Patient Education method: Explanation Education comprehension: verbalized understanding  HOME EXERCISE PROGRAM: Trial of repeated lumbar spine AROM- extension 2x10 when sx occur  ASSESSMENT:  CLINICAL IMPRESSION: Patient is a 67 y.o. F who was seen today for physical therapy evaluation and treatment for referral for R knee pain.  Had limited time for PT evaluation today.  Based on PT evaluation today I was not able to fully rule out lumbar spine vs hip is possible contributors to her overall presentation.  Her sx appear to be low in irritability; will further assess lower quarter neuro screen, lumbar spine, and hip at visit 2.   OBJECTIVE IMPAIRMENTS: Abnormal gait, decreased activity tolerance, decreased balance, decreased ROM, decreased strength, impaired perceived functional ability, and pain.   ACTIVITY LIMITATIONS: standing, squatting, stairs, locomotion level, and jogging CCW  PARTICIPATION LIMITATIONS: community activity, occupation, yard  work, and regular exercise at the gym  PERSONAL FACTORS: Time since onset of injury/illness/exacerbation are also affecting patient's functional outcome.   REHAB POTENTIAL: Good  CLINICAL DECISION MAKING: Stable/uncomplicated  EVALUATION COMPLEXITY: Low   GOALS: Goals reviewed with patient? Yes  SHORT TERM GOALS: Target date: 07/24/23 Pt will be able to resume LE strengthening at her gym without being limited by pain Baseline: only doing Upper body strength work Goal status: INITIAL    LONG TERM GOALS: Target date: 08/10/23  Improve FOTO to >81 indicating pt able to perform her daily activities without being limited by R LE sx Baseline: 72 at initial eval Goal status: INITIAL  2.  Improve R hip strength 1/2 MMT grade to promote improved ability to jog CCW while showing dogs with <3/10 R LE pain Baseline:  Goal status: INITIAL  3.  Pt will be able to assist 70 lb dogs into her van without <3/10 R LE pain Baseline: needs assistance Goal status: INITIAL    PLAN:  PT FREQUENCY: 1-2x/week  PT DURATION: 6 weeks  PLANNED INTERVENTIONS: Patient/Family education, Balance training, Joint mobilization, Therapeutic exercises, Therapeutic activity, Neuromuscular re-education, Gait training, and Self Care  PLAN FOR NEXT SESSION: further assess lumbar spine, hip, LQ neuro screen  Max Fickle, PT, DPT, OCS Ardine Bjork, PT 07/01/2023, 8:57 AM

## 2023-07-01 ENCOUNTER — Ambulatory Visit: Payer: Medicare HMO

## 2023-07-01 DIAGNOSIS — M79604 Pain in right leg: Secondary | ICD-10-CM

## 2023-07-01 DIAGNOSIS — R29898 Other symptoms and signs involving the musculoskeletal system: Secondary | ICD-10-CM

## 2023-07-01 DIAGNOSIS — M25561 Pain in right knee: Secondary | ICD-10-CM | POA: Diagnosis not present

## 2023-07-01 DIAGNOSIS — M79661 Pain in right lower leg: Secondary | ICD-10-CM | POA: Diagnosis not present

## 2023-07-06 ENCOUNTER — Ambulatory Visit: Payer: Medicare HMO

## 2023-07-06 DIAGNOSIS — M79661 Pain in right lower leg: Secondary | ICD-10-CM | POA: Diagnosis not present

## 2023-07-06 DIAGNOSIS — R29898 Other symptoms and signs involving the musculoskeletal system: Secondary | ICD-10-CM | POA: Diagnosis not present

## 2023-07-06 DIAGNOSIS — M79604 Pain in right leg: Secondary | ICD-10-CM | POA: Diagnosis not present

## 2023-07-06 DIAGNOSIS — M25561 Pain in right knee: Secondary | ICD-10-CM | POA: Diagnosis not present

## 2023-07-06 NOTE — Therapy (Signed)
OUTPATIENT PHYSICAL THERAPY LOWER EXTREMITY TREATMENT    Patient Name: Janet Brock MRN: [id number] DOB:[birth year], 67 y.o., female Today's Date: 07/06/2023  END OF SESSION:  PT End of Session - 07/06/23 1730     Visit Number 3    Number of Visits 13    Date for PT Re-Evaluation 08/10/23    Authorization Type 2x/wk x 6 weeks, recert 08/10/23    PT Start Time 1245    PT Stop Time 1330    PT Time Calculation (min) 45 min    Activity Tolerance Patient tolerated treatment well    Behavior During Therapy WFL for tasks assessed/performed              Past Medical History:  Diagnosis Date   Anemia    past hx - off Iron   Heart murmur    History of varicella    Hypothyroidism    synthroid  ? hyperthyroid when young and thin and then  dx hypo dx at clinic at work    Migraines    pre menopausal  hx of vicodin  rescue    Past Surgical History:  Procedure Laterality Date   ABLATION     ? year    btl     CATARACT EXTRACTION Bilateral    12/06/2021, 12/20/2021   COLONOSCOPY     approx 2 years ago   HYSTEROSCOPY  05/08/2011   Procedure: HYSTEROSCOPY WITH HYDROTHERMAL ABLATION;  Surgeon: Levi Aland;  Location: [facility];  Service: Gynecology;  Laterality: N/A;   svd       x 2   WISDOM TOOTH EXTRACTION     Patient Active Problem List   Diagnosis Date Noted   Breast lump 05/19/2018   Greater trochanteric bursitis of right hip 11/14/2016   Hypothyroidism     PCP: Dr. Fabian Sharp, MD  REFERRING PROVIDER: Dr. Denyse Amass, MD  REFERRING DIAG:  [phone] (ICD-10-CM) - Right knee pain, unspecified chronicity  M79.661 (ICD-10-CM) - Right calf pain    THERAPY DIAG:  Pain in right leg  Weakness of right lower extremity  Rationale for Evaluation and Treatment: Rehabilitation  ONSET DATE: Labor day weekend 2024  SUBJECTIVE: (From initial evaluation note 06/29/23)  SUBJECTIVE STATEMENT: Pt states she was showing dogs in [place] in August 2024; she travels in a big [place] with her  dogs- has a step stool to get in and out of the [place].  She started noticing her R leg was hurting on that trip.  By 5 days into the trip her leg pain progressively worsened and she was having difficulty walking.  Feels "phantom" pain.  She did not have a MOI.  Onset was gradual.  Overall sx are improving.      She has not had this type of sensation/pain before.  No back pain with the sx.  No L leg sx.  Not tingling, but sx shift around   Location: anterior/lateral R hip, sometimes posterior R thigh/knee/calf pain- posterior knee initially was the worst- getting better  She saw her PCP for a consult when she got back and also sports medicine MD the same day.  They did an ultrasound, x-rays and everything was (-).    She got a few exercises: heel drops, soleus stretch, gastroc stretch.    Agg: going up stairs is hard if her leg is tired; walking/jogging  Allev: topical lidocaine patches- helps; has not used them in a few days.  She has not tried NSAIDs.  Being upright feels  better.    Has tried 1 chiropractor tx- no change.    She does not notice that weight bearing specifically influences her sx- maybe more in the lower leg.  Feels good lying.   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  Has to be able to work with dogs that are up to 70 lbs.  Has to be able to lift into the upper crate in the [place]. Has to run counterclockwise.    Enjoys being active in her yard, property   PERTINENT HISTORY: She shows dogs as a professional hobby She goes to the gym 3x/week- does machines, not doing typical workouts currently bc of sx  PAIN:  Are you having pain? Yes; current 0/10; worst 10/10 (early September- Labor Day); in the past week 2-3/10 at worst   PRECAUTIONS: None  RED FLAGS: None  WEIGHT BEARING  RESTRICTIONS: No  FALLS:  Has patient fallen in last 6 months? 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  DIAGNOSTIC FINDINGS: had x-ray and [place] of knee, both (-)  PATIENT SURVEYS:  FOTO 72/81  COGNITION: Overall cognitive status: Within functional limits for tasks assessed      POSTURE: No Significant postural limitations  Pt able to like supine.  (-) R FADDIR today  PALPATION:  (+)TTP R greater trochanter, (-) TTP R knee soft tissue/mm  LOWER EXTREMITY ROM:  Active ROM Right eval Left eval  Hip flexion normal normal  Hip extension    Hip abduction    Hip adduction    Hip internal rotation 15 deg, 35 deg, Pt notes discomfort with OP normal  Hip external rotation Pt notes discomfort with OP normal  Knee flexion Normal and pain free OP  normal  Knee extension Normal and pain free OP normal  Ankle dorsiflexion    Ankle plantarflexion    Ankle inversion    Ankle eversion     (Blank rows = not tested) Further motion testing of hip will be performed at future visit LOWER EXTREMITY MMT:  MMT (out of 5) Right eval Left eval  Hip flexion    Hip extension    Hip abduction    Hip adduction    Hip internal rotation 4/5Pt notes discomfort 5  Hip external rotation 4/5 Pt notes discomfort 5  Knee flexion 5 5  Knee extension 5 5  Ankle dorsiflexion    Ankle plantarflexion    Ankle inversion    Ankle eversion     (Blank rows = not tested)  LOWER  EXTREMITY SPECIAL TESTS:  Full lower quarter neuro screen deferred to next visit due to time constraints as pt arrived late to apt today (-) lumbar extension/rotation test R and L for reproduction of sx  FUNCTIONAL TESTS:  (+) R LE sx pain with squat, (+)  impaired R SLS motor control compared to R, (+) R lateral hip pain with jogging counter clockwise how she has to show dogs  LUMBAR AROM:  (-) reproduction of pain with lumbar spine AROM flexion, extension, side bend, rotation and (-) with OP.  Further PAM deferred to visit #2 due to time constraints.   TODAY'S TREATMENT:                                                                                                                              DATE: 07/06/23  Subjective: cleaned out a storage unit at her home; R low back is a bit sore/tight, no LE sx with the back sx.  Tried the HEP and had some discomfort.  Overall LE sx remain vague and intermittent.  Objective: Manual Therapy: PAM testing L1-5: CPA/UPA; (+) R low back sx with L4 R UPA S/L neutral gapping PAM: R rotation (+) R low back sx CPA sacrum (-) sx  Gr II/II R neutral gapping mob 30 sec x 3, R L4-5 UPA improved mobility- pt notes reduced sx at end  (+) MTP R gastroc (-) ligamentous testing of R knee for sx reproduction, (-) pain with knee AROM and OP flex/ext Unremarkable palpation R lateral knee soft tissue structures  Therapeutic Exercises: Bridges x10 Hip abd with PT manual resistance in hooklying x10 S/L R hip abd x10  Initiated and practiced HEP: bridges 5 second holds x15, 2 sets, hip abd wit blue band hooklying + bridge x15, 2 sets  PATIENT EDUCATION:  Education details: PT POC/goals/HEP initiated Person educated: Patient Education method: Explanation Education comprehension: verbalized understanding  HOME EXERCISE PROGRAM: HEP: bridges 5 second holds x15, 2 sets, hip abd wit blue band hooklying + bridge x15, 2 sets  ASSESSMENT:  CLINICAL  IMPRESSION: Pt's R hip mm weakness noted during therapeutic exercises;  hip abd reproduced R lateral knee sx when she relaxed leg after reps  Assessment and interventions for lumbar spine did not recreate R knee sx or R LE sx today  (+) MTPs R gastroc.  Will continue to address hip mm strength deficits and incorporate manual tx for R gastroc mm at next esssion.  OBJECTIVE IMPAIRMENTS: Abnormal gait, decreased activity tolerance, decreased balance, decreased ROM, decreased strength, impaired perceived functional ability, and pain.   ACTIVITY LIMITATIONS: standing, squatting, stairs, locomotion level, and jogging CCW  PARTICIPATION LIMITATIONS: community activity, occupation, yard work, and regular exercise at the gym  PERSONAL FACTORS: Time since onset of injury/illness/exacerbation are also affecting patient's functional outcome.   REHAB POTENTIAL: Good  CLINICAL DECISION MAKING: Stable/uncomplicated  EVALUATION COMPLEXITY: Low   GOALS: Goals reviewed with patient? Yes  SHORT TERM GOALS: Target date: 07/24/23 Pt will be able to resume LE strengthening at her gym without being limited by pain Baseline: only doing Upper body strength work Goal status: INITIAL  LONG TERM GOALS: Target date: 08/10/23  Improve FOTO to >81 indicating pt able to perform her daily  activities without being limited by R LE sx Baseline: 72 at initial eval Goal status: INITIAL  2.  Improve R hip strength 1/2 MMT grade to promote improved ability to jog CCW while showing dogs with <3/10 R LE pain Baseline:  Goal status: INITIAL  3.  Pt will be able to assist 70 lb dogs into her van without <3/10 R LE pain Baseline: needs assistance Goal status: INITIAL    PLAN:  PT FREQUENCY: 1-2x/week  PT DURATION: 6 weeks  PLANNED INTERVENTIONS: Patient/Family education, Balance training, Joint mobilization, Therapeutic exercises, Therapeutic activity, Neuromuscular re-education, Gait training, and Self  Care  PLAN FOR NEXT SESSION: continue R LE strengthening (gluteal mm emphasis), manual therapy  Max Fickle, PT, DPT, OCS Ardine Bjork, PT 07/06/2023, 5:31 PM

## 2023-07-06 NOTE — Therapy (Signed)
OUTPATIENT PHYSICAL THERAPY LOWER EXTREMITY TREATMENT 07/01/23   Patient Name: Janet Brock MRN: [id number] DOB:12/27/1955, 67 y.o., female Today's Date: 07/06/2023  END OF SESSION:  PT End of Session - 07/06/23 0929     Visit Number 2    Number of Visits 13    Date for PT Re-Evaluation 08/10/23    Authorization Type 2x/wk x 6 weeks, recert 08/10/23    PT Start Time 0945    PT Stop Time 1030    PT Time Calculation (min) 45 min    Activity Tolerance Patient tolerated treatment well    Behavior During Therapy WFL for tasks assessed/performed              Past Medical History:  Diagnosis Date   Anemia    past hx - off Iron   Heart murmur    History of varicella    Hypothyroidism    synthroid  ? hyperthyroid when young and thin and then  dx hypo dx at clinic at work    Migraines    pre menopausal  hx of vicodin  rescue    Past Surgical History:  Procedure Laterality Date   ABLATION     ? year    btl     CATARACT EXTRACTION Bilateral    12/06/2021, 12/20/2021   COLONOSCOPY     approx 2 years ago   HYSTEROSCOPY  05/08/2011   Procedure: HYSTEROSCOPY WITH HYDROTHERMAL ABLATION;  Surgeon: Levi Aland;  Location: [facility];  Service: Gynecology;  Laterality: N/A;   svd       x 2   WISDOM TOOTH EXTRACTION     Patient Active Problem List   Diagnosis Date Noted   Breast lump 05/19/2018   Greater trochanteric bursitis of right hip 11/14/2016   Hypothyroidism     PCP: Dr. Fabian Sharp, MD  REFERRING PROVIDER: Dr. Denyse Amass, MD  REFERRING DIAG:  [phone] (ICD-10-CM) - Right knee pain, unspecified chronicity  M79.661 (ICD-10-CM) - Right calf pain    THERAPY DIAG:  Pain in right leg  Weakness of right lower extremity  Rationale for Evaluation and Treatment: Rehabilitation  ONSET DATE: Labor day weekend 2024  SUBJECTIVE:   SUBJECTIVE STATEMENT: Pt states she was showing dogs in [place] in August 2024; she travels in a big [place] with her dogs- has a step stool to get in  and out of the [place].  She started noticing her R leg was hurting on that trip.  By 5 days into the trip her leg pain progressively worsened and she was having difficulty walking.  Feels "phantom" pain.  She did not have a MOI.  Onset was gradual.  Overall sx are improving.      She has not had this type of sensation/pain before.  No back pain with the sx.  No L leg sx.  Not tingling, but sx shift around   Location: anterior/lateral R hip, sometimes posterior R thigh/knee/calf pain- posterior knee initially was the worst- getting better  She saw her PCP for a consult when she got back and also sports medicine MD the same day.  They did an ultrasound, x-rays and everything was (-).    She got a few exercises: heel drops, soleus stretch, gastroc stretch.    Agg: going up stairs is hard if her leg is tired; walking/jogging  Allev: topical lidocaine patches- helps; has not used them in a few days.  She has not tried NSAIDs.  Being upright feels better.  Has tried 1 chiropractor tx- no change.    She does not notice that weight bearing specifically influences her sx- maybe more in the lower leg.  Feels good lying.   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  Has to be able to work with dogs that are up to 70 lbs.  Has to be able to lift into the upper crate in the [place]. Has to run counterclockwise.    Enjoys being active in her yard, property   PERTINENT HISTORY: She shows dogs as a professional hobby She goes to the gym 3x/week- does machines, not doing typical workouts currently bc of sx  PAIN:  Are you having pain? Yes; current 0/10; worst 10/10 (early September- Labor Day); in the past week 2-3/10 at worst   PRECAUTIONS: None  RED FLAGS: None  WEIGHT BEARING RESTRICTIONS: No  FALLS:  Has patient  fallen in last 6 months? 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  DIAGNOSTIC FINDINGS: had x-ray and [place] of knee, both (-)  PATIENT SURVEYS:  FOTO 72/81  COGNITION: Overall cognitive status: Within functional limits for tasks assessed      POSTURE: No Significant postural limitations  Pt able to like supine.  (-) R FADDIR today  PALPATION:  (+)TTP R greater trochanter, (-) TTP R knee soft tissue/mm  LOWER EXTREMITY ROM:  Active ROM Right eval Left eval  Hip flexion normal normal  Hip extension    Hip abduction    Hip adduction    Hip internal rotation 15 deg, 35 deg, Pt notes discomfort with OP normal  Hip external rotation Pt notes discomfort with OP normal  Knee flexion Normal and pain free OP  normal  Knee extension Normal and pain free OP normal  Ankle dorsiflexion    Ankle plantarflexion    Ankle inversion    Ankle eversion     (Blank rows = not tested) Further motion testing of hip will be performed at future visit LOWER EXTREMITY MMT:  MMT (out of 5) Right eval Left eval  Hip flexion    Hip extension    Hip abduction    Hip adduction    Hip internal rotation 4/5Pt notes discomfort 5  Hip external rotation 4/5 Pt notes discomfort 5  Knee flexion 5 5  Knee extension 5 5  Ankle dorsiflexion    Ankle plantarflexion    Ankle inversion    Ankle eversion     (Blank rows = not tested)  LOWER EXTREMITY SPECIAL TESTS:  Full lower quarter  neuro screen deferred to next visit due to time constraints as pt arrived late to apt today (-) lumbar extension/rotation test R and L for reproduction of sx  FUNCTIONAL TESTS:  (+) R LE sx pain with squat, (+) impaired R SLS  motor control compared to R, (+) R lateral hip pain with jogging counter clockwise how she has to show dogs  LUMBAR AROM:  (-) reproduction of pain with lumbar spine AROM flexion, extension, side bend, rotation and (-) with OP.  Further PAM deferred to visit #2 due to time constraints.   TODAY'S TREATMENT:                                                                                                                              DATE: 07/01/23  Subjective: pt reports she tried lumbar extension movements for HEP, no influence on sx overall  Objective: LQ Neuro screen: normal sensation L1-S2 dermatomes; normal myotome testing L1-S1; (-) SLR R and L for reproduction of sx; 1+ LE reflexes b/l patellar and Achilles  R hip further evaluation:  (+) hypomobile and reproduction of sx with PAM hip A/P glide and P/A glide, improved with repeated; (+) relief with distraction  MMT R hip abd 4/5 painful (L 4+/5) R hip ext 4/5 painful (L 4+/5)  (+) hip external de-rotation test R   Initiated and practiced HEP: bridges 5 second holds x15, 2 sets, hip abd wit blue band hooklying + bridge x15, 2 sets  PATIENT EDUCATION:  Education details: PT POC/goals/HEP initiated Person educated: Patient Education method: Explanation Education comprehension: verbalized understanding  HOME EXERCISE PROGRAM: Trial of repeated lumbar spine AROM- extension 2x10 when sx occur  ASSESSMENT:  CLINICAL IMPRESSION: Suspect hip to be primary contributing factor to her sx based on additional findings within session today of (-) LQ neuro screen, (+) hip mobility/pain deficits, and findings consistent with GTPS.  OBJECTIVE IMPAIRMENTS: Abnormal gait, decreased activity tolerance, decreased  balance, decreased ROM, decreased strength, impaired perceived functional ability, and pain.   ACTIVITY LIMITATIONS: standing, squatting, stairs, locomotion level, and jogging CCW  PARTICIPATION LIMITATIONS: community activity, occupation, yard work, and regular exercise at the gym  PERSONAL FACTORS: Time since onset of injury/illness/exacerbation are also affecting patient's functional outcome.   REHAB POTENTIAL: Good  CLINICAL DECISION MAKING: Stable/uncomplicated  EVALUATION COMPLEXITY: Low   GOALS: Goals reviewed with patient? Yes  SHORT TERM GOALS: Target date: 07/24/23 Pt will be able to resume LE strengthening at her gym without being limited by pain Baseline: only doing Upper body strength work Goal status: INITIAL  LONG TERM GOALS: Target date: 08/10/23  Improve FOTO to >81 indicating pt able to perform her daily activities without being limited by R LE sx Baseline: 72 at initial eval Goal status: INITIAL  2.  Improve R hip strength 1/2 MMT grade to promote improved ability to jog CCW while showing dogs with <3/10 R LE pain Baseline:  Goal status: INITIAL  3.  Pt will be able to assist 70 lb dogs into her van without <3/10 R LE pain Baseline: needs assistance Goal status: INITIAL    PLAN:  PT FREQUENCY: 1-2x/week  PT DURATION: 6 weeks  PLANNED INTERVENTIONS: Patient/Family education, Balance training, Joint  mobilization, Therapeutic exercises, Therapeutic activity, Neuromuscular re-education, Gait training, and Self Care  PLAN FOR NEXT SESSION: continue R LE strengthening (gluteal mm emphasis), manual therapy  Max Fickle, PT, DPT, OCS Ardine Bjork, PT 07/06/2023, 9:30 AM

## 2023-07-07 DIAGNOSIS — Z0130 Encounter for examination of blood pressure without abnormal findings: Secondary | ICD-10-CM | POA: Diagnosis not present

## 2023-07-07 DIAGNOSIS — Z1231 Encounter for screening mammogram for malignant neoplasm of breast: Secondary | ICD-10-CM | POA: Diagnosis not present

## 2023-07-08 ENCOUNTER — Ambulatory Visit: Payer: Medicare HMO

## 2023-07-08 DIAGNOSIS — R29898 Other symptoms and signs involving the musculoskeletal system: Secondary | ICD-10-CM | POA: Diagnosis not present

## 2023-07-08 DIAGNOSIS — M79604 Pain in right leg: Secondary | ICD-10-CM

## 2023-07-08 DIAGNOSIS — M79661 Pain in right lower leg: Secondary | ICD-10-CM | POA: Diagnosis not present

## 2023-07-08 DIAGNOSIS — M25561 Pain in right knee: Secondary | ICD-10-CM | POA: Diagnosis not present

## 2023-07-08 NOTE — Therapy (Signed)
OUTPATIENT PHYSICAL THERAPY LOWER EXTREMITY TREATMENT    Patient Name: Janet Brock MRN: [id number] DOB:[birth year], 67 y.o., female Today's Date: 07/08/2023  END OF SESSION:  PT End of Session - 07/08/23 0955     Visit Number 4    Number of Visits 13    Date for PT Re-Evaluation 08/10/23    Authorization Type 2x/wk x 6 weeks, recert 08/10/23    PT Start Time 0950    PT Stop Time 1030    PT Time Calculation (min) 40 min    Activity Tolerance Patient tolerated treatment well    Behavior During Therapy WFL for tasks assessed/performed              Past Medical History:  Diagnosis Date   Anemia    past hx - off Iron   Heart murmur    History of varicella    Hypothyroidism    synthroid  ? hyperthyroid when young and thin and then  dx hypo dx at clinic at work    Migraines    pre menopausal  hx of vicodin  rescue    Past Surgical History:  Procedure Laterality Date   ABLATION     ? year    btl     CATARACT EXTRACTION Bilateral    12/06/2021, 12/20/2021   COLONOSCOPY     approx 2 years ago   HYSTEROSCOPY  05/08/2011   Procedure: HYSTEROSCOPY WITH HYDROTHERMAL ABLATION;  Surgeon: Levi Aland;  Location: [facility];  Service: Gynecology;  Laterality: N/A;   svd       x 2   WISDOM TOOTH EXTRACTION     Patient Active Problem List   Diagnosis Date Noted   Breast lump 05/19/2018   Greater trochanteric bursitis of right hip 11/14/2016   Hypothyroidism     PCP: Dr. Fabian Sharp, MD  REFERRING PROVIDER: Dr. Denyse Amass, MD  REFERRING DIAG:  [phone] (ICD-10-CM) - Right knee pain, unspecified chronicity  M79.661 (ICD-10-CM) - Right calf pain    THERAPY DIAG:  Pain in right leg  Weakness of right lower extremity  Rationale for Evaluation and Treatment: Rehabilitation  ONSET DATE: Labor day weekend 2024  SUBJECTIVE: (From initial evaluation note 06/29/23)  SUBJECTIVE STATEMENT: Pt states she was showing dogs in [place] in August 2024; she travels in a big [place] with her  dogs- has a step stool to get in and out of the [place].  She started noticing her R leg was hurting on that trip.  By 5 days into the trip her leg pain progressively worsened and she was having difficulty walking.  Feels "phantom" pain.  She did not have a MOI.  Onset was gradual.  Overall sx are improving.      She has not had this type of sensation/pain before.  No back pain with the sx.  No L leg sx.  Not tingling, but sx shift around   Location: anterior/lateral R hip, sometimes posterior R thigh/knee/calf pain- posterior knee initially was the worst- getting better  She saw her PCP for a consult when she got back and also sports medicine MD the same day.  They did an ultrasound, x-rays and everything was (-).    She got a few exercises: heel drops, soleus stretch, gastroc stretch.    Agg: going up stairs is hard if her leg is tired; walking/jogging  Allev: topical lidocaine patches- helps; has not used them in a few days.  She has not tried NSAIDs.  Being upright feels  better.    Has tried 1 chiropractor tx- no change.    She does not notice that weight bearing specifically influences her sx- maybe more in the lower leg.  Feels good lying.   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  Has to be able to work with dogs that are up to 70 lbs.  Has to be able to lift into the upper crate in the [place]. Has to run counterclockwise.    Enjoys being active in her yard, property   PERTINENT HISTORY: She shows dogs as a professional hobby She goes to the gym 3x/week- does machines, not doing typical workouts currently bc of sx  PAIN:  Are you having pain? Yes; current 0/10; worst 10/10 (early September- Labor Day); in the past week 2-3/10 at worst   PRECAUTIONS: None  RED FLAGS: None  WEIGHT BEARING  RESTRICTIONS: No  FALLS:  Has patient fallen in last 6 months? 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  DIAGNOSTIC FINDINGS: had x-ray and [place] of knee, both (-)  PATIENT SURVEYS:  FOTO 72/81  COGNITION: Overall cognitive status: Within functional limits for tasks assessed      POSTURE: No Significant postural limitations  Pt able to like supine.  (-) R FADDIR today  PALPATION:  (+)TTP R greater trochanter, (-) TTP R knee soft tissue/mm  LOWER EXTREMITY ROM:  Active ROM Right eval Left eval  Hip flexion normal normal  Hip extension    Hip abduction    Hip adduction    Hip internal rotation 15 deg, 35 deg, Pt notes discomfort with OP normal  Hip external rotation Pt notes discomfort with OP normal  Knee flexion Normal and pain free OP  normal  Knee extension Normal and pain free OP normal  Ankle dorsiflexion    Ankle plantarflexion    Ankle inversion    Ankle eversion     (Blank rows = not tested) Further motion testing of hip will be performed at future visit LOWER EXTREMITY MMT:  MMT (out of 5) Right eval Left eval  Hip flexion    Hip extension    Hip abduction    Hip adduction    Hip internal rotation 4/5Pt notes discomfort 5  Hip external rotation 4/5 Pt notes discomfort 5  Knee flexion 5 5  Knee extension 5 5  Ankle dorsiflexion    Ankle plantarflexion    Ankle inversion    Ankle eversion     (Blank rows = not tested)  LOWER  EXTREMITY SPECIAL TESTS:  Full lower quarter neuro screen deferred to next visit due to time constraints as pt arrived late to apt today (-) lumbar extension/rotation test R and L for reproduction of sx  FUNCTIONAL TESTS:  (+) R LE sx pain with squat, (+)  impaired R SLS motor control compared to R, (+) R lateral hip pain with jogging counter clockwise how she has to show dogs  LUMBAR AROM:  (-) reproduction of pain with lumbar spine AROM flexion, extension, side bend, rotation and (-) with OP.  Further PAM deferred to visit #2 due to time constraints.   TODAY'S TREATMENT:                                                                                                                              DATE: 07/08/23  Subjective: overall feeling better than she felt at last PT session.  She reports she feels like she is moving better overall.  She thinks she knows what "caused her injury"- overuse when doing so many dog shows over labor day after only doing a few for the rest of the summer before this intense weekend; has f/u with sports med on 07/15/23  Objective: Manual Therapy: STM R gastroc  Therapeutic Exercises: Bridges x10 R Standing SLS: cues for foot tripod, upright posture- to promote activation of gluteal mm + ankle/intrinsic foot mm; 5 trials  Standing heel raises b/l: 2x15 S/L R hip abd x10 with 5 sec holds up/down 2 inches   PATIENT EDUCATION:  Education details: PT POC/goals/HEP initiated Person educated: Patient Education method: Explanation Education comprehension: verbalized understanding  HOME EXERCISE PROGRAM: Access Code: [email or web address] URL: [email or web address] Date: 07/08/2023 Prepared by: Max Fickle  Exercises - Standing Single Leg Stance with Counter Support  - 1 x daily - 5 x weekly - 5 reps - 20 hold - Heel Raises with Counter Support  - 1 x daily - 5 x weekly - 2 sets - 12 reps - Supine Bridge  - 1 x daily - 5 x weekly - 2 sets - 10 reps - 5  hold - Sidelying Hip Abduction  - 1 x daily - 5 x weekly - 2 sets - 10 reps  ASSESSMENT:  CLINICAL IMPRESSION: Initiated manual therapy interventions for MTPs in R gastroc (lateral > medial head) today; pt tolerated well.  Continued to progress gluteal and whole LE CKC mm retraining.  Pt should continue to benefit from skilled PT interventions to address the impairments listed below to promote return to PLOF.  OBJECTIVE IMPAIRMENTS: Abnormal gait, decreased activity tolerance, decreased balance, decreased ROM, decreased strength, impaired perceived functional ability, and pain.   ACTIVITY LIMITATIONS: standing, squatting, stairs, locomotion level, and jogging CCW  PARTICIPATION LIMITATIONS: community activity, occupation, yard work, and regular exercise at the gym  PERSONAL FACTORS: Time since onset of injury/illness/exacerbation are also affecting patient's functional outcome.   REHAB POTENTIAL: Good  CLINICAL DECISION MAKING: Stable/uncomplicated  EVALUATION COMPLEXITY: Low   GOALS: Goals reviewed with patient? Yes  SHORT TERM GOALS: Target date: 07/24/23 Pt will be able to resume LE strengthening at her gym without being limited by pain Baseline: only doing Upper body strength work Goal status: INITIAL  LONG TERM GOALS: Target date: 08/10/23  Improve FOTO to >81 indicating pt able to perform her daily activities without being limited by R LE sx Baseline: 72 at initial eval Goal status: INITIAL  2.  Improve R hip strength 1/2 MMT grade to promote improved ability to jog CCW while showing dogs with <3/10 R LE pain Baseline:  Goal status: INITIAL  3.  Pt will be able to assist 70 lb dogs into her van without <3/10 R LE pain Baseline: needs assistance Goal status: INITIAL    PLAN:  PT FREQUENCY: 1-2x/week  PT DURATION: 6 weeks  PLANNED INTERVENTIONS: Patient/Family education, Balance training, Joint mobilization, Therapeutic exercises, Therapeutic activity,  Neuromuscular re-education, Gait training, and Self Care  PLAN FOR NEXT SESSION: continue R LE strengthening (gluteal mm emphasis) and CKC LE strengthening manual therapy L gastroc  Max Fickle, PT, DPT, OCS Ardine Bjork, PT 07/08/2023, 12:05 PM

## 2023-07-13 ENCOUNTER — Ambulatory Visit: Payer: Medicare HMO

## 2023-07-13 DIAGNOSIS — M79661 Pain in right lower leg: Secondary | ICD-10-CM | POA: Diagnosis not present

## 2023-07-13 DIAGNOSIS — R29898 Other symptoms and signs involving the musculoskeletal system: Secondary | ICD-10-CM | POA: Diagnosis not present

## 2023-07-13 DIAGNOSIS — M79604 Pain in right leg: Secondary | ICD-10-CM | POA: Diagnosis not present

## 2023-07-13 DIAGNOSIS — M25561 Pain in right knee: Secondary | ICD-10-CM | POA: Diagnosis not present

## 2023-07-13 NOTE — Therapy (Signed)
OUTPATIENT PHYSICAL THERAPY LOWER EXTREMITY TREATMENT    Patient Name: Janet Brock MRN: [id number] DOB:September 05, 1956, 67 y.o., female Today's Date: 07/13/2023  END OF SESSION:  PT End of Session - 07/13/23 1346     Visit Number 5    Number of Visits 13    Date for PT Re-Evaluation 08/10/23    Authorization Type 2x/wk x 6 weeks, recert 08/10/23    PT Start Time 1210    PT Stop Time 1255    PT Time Calculation (min) 45 min    Activity Tolerance Patient tolerated treatment well    Behavior During Therapy WFL for tasks assessed/performed               Past Medical History:  Diagnosis Date   Anemia    past hx - off Iron   Heart murmur    History of varicella    Hypothyroidism    synthroid  ? hyperthyroid when young and thin and then  dx hypo dx at clinic at work    Migraines    pre menopausal  hx of vicodin  rescue    Past Surgical History:  Procedure Laterality Date   ABLATION     ? year    btl     CATARACT EXTRACTION Bilateral    12/06/2021, 12/20/2021   COLONOSCOPY     approx 2 years ago   HYSTEROSCOPY  05/08/2011   Procedure: HYSTEROSCOPY WITH HYDROTHERMAL ABLATION;  Surgeon: Levi Aland;  Location: [facility];  Service: Gynecology;  Laterality: N/A;   svd       x 2   WISDOM TOOTH EXTRACTION     Patient Active Problem List   Diagnosis Date Noted   Breast lump 05/19/2018   Greater trochanteric bursitis of right hip 11/14/2016   Hypothyroidism     PCP: Dr. Fabian Sharp, MD  REFERRING PROVIDER: Dr. Denyse Amass, MD  REFERRING DIAG:  [phone] (ICD-10-CM) - Right knee pain, unspecified chronicity  M79.661 (ICD-10-CM) - Right calf pain    THERAPY DIAG:  Pain in right leg  Weakness of right lower extremity  Rationale for Evaluation and Treatment: Rehabilitation  ONSET DATE: Labor day weekend 2024  SUBJECTIVE: (From initial evaluation note 06/29/23)  SUBJECTIVE STATEMENT: Pt states she was showing dogs in [place] in August 2024; she travels in a big [place] with her  dogs- has a step stool to get in and out of the [place].  She started noticing her R leg was hurting on that trip.  By 5 days into the trip her leg pain progressively worsened and she was having difficulty walking.  Feels "phantom" pain.  She did not have a MOI.  Onset was gradual.  Overall sx are improving.      She has not had this type of sensation/pain before.  No back pain with the sx.  No L leg sx.  Not tingling, but sx shift around   Location: anterior/lateral R hip, sometimes posterior R thigh/knee/calf pain- posterior knee initially was the worst- getting better  She saw her PCP for a consult when she got back and also sports medicine MD the same day.  They did an ultrasound, x-rays and everything was (-).    She got a few exercises: heel drops, soleus stretch, gastroc stretch.    Agg: going up stairs is hard if her leg is tired; walking/jogging  Allev: topical lidocaine patches- helps; has not used them in a few days.  She has not tried NSAIDs.  Being upright  feels better.    Has tried 1 chiropractor tx- no change.    She does not notice that weight bearing specifically influences her sx- maybe more in the lower leg.  Feels good lying.   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  Has to be able to work with dogs that are up to 70 lbs.  Has to be able to lift into the upper crate in the [place]. Has to run counterclockwise.    Enjoys being active in her yard, property   PERTINENT HISTORY: She shows dogs as a professional hobby She goes to the gym 3x/week- does machines, not doing typical workouts currently bc of sx  PAIN:  Are you having pain? Yes; current 0/10; worst 10/10 (early September- Labor Day); in the past week 2-3/10 at worst   PRECAUTIONS: None  RED FLAGS: None  WEIGHT BEARING  RESTRICTIONS: No  FALLS:  Has patient fallen in last 6 months? 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  DIAGNOSTIC FINDINGS: had x-ray and [place] of knee, both (-)  PATIENT SURVEYS:  FOTO 72/81  COGNITION: Overall cognitive status: Within functional limits for tasks assessed      POSTURE: No Significant postural limitations  Pt able to like supine.  (-) R FADDIR today  PALPATION:  (+)TTP R greater trochanter, (-) TTP R knee soft tissue/mm  LOWER EXTREMITY ROM:  Active ROM Right eval Left eval  Hip flexion normal normal  Hip extension    Hip abduction    Hip adduction    Hip internal rotation 15 deg, 35 deg, Pt notes discomfort with OP normal  Hip external rotation Pt notes discomfort with OP normal  Knee flexion Normal and pain free OP  normal  Knee extension Normal and pain free OP normal  Ankle dorsiflexion    Ankle plantarflexion    Ankle inversion    Ankle eversion     (Blank rows = not tested) Further motion testing of hip will be performed at future visit LOWER EXTREMITY MMT:  MMT (out of 5) Right eval Left eval  Hip flexion    Hip extension    Hip abduction    Hip adduction    Hip internal rotation 4/5Pt notes discomfort 5  Hip external rotation 4/5 Pt notes discomfort 5  Knee flexion 5 5  Knee extension 5 5  Ankle dorsiflexion    Ankle plantarflexion    Ankle inversion    Ankle eversion     (Blank rows = not tested)  LOWER  EXTREMITY SPECIAL TESTS:  Full lower quarter neuro screen deferred to next visit due to time constraints as pt arrived late to apt today (-) lumbar extension/rotation test R and L for reproduction of sx  FUNCTIONAL TESTS:  (+) R LE sx pain with  squat, (+) impaired R SLS motor control compared to R, (+) R lateral hip pain with jogging counter clockwise how she has to show dogs  LUMBAR AROM:  (-) reproduction of pain with lumbar spine AROM flexion, extension, side bend, rotation and (-) with OP.  Further PAM deferred to visit #2 due to time constraints.   TODAY'S TREATMENT:                                                                                                                              DATE: 07/13/23  Subjective: pt worked on her HEP; she found she was able to do it at her gym this weekend.  Her calf is feeling good.  She had some soreness in her lateral leg (near ITB insertion) after.    Objective: Manual Therapy: 25 min STM R gastroc, R ant tib  Therapeutic Exercises: 20 min Bridges x10 Bridge 2 feet to just R foot with 3-5 sec holds x 10 R Standing SLS: cues for foot tripod, upright posture- to promote activation of gluteal mm + ankle/intrinsic foot mm; 5 trials; focused on L hip in flexed position too Standing heel raises b/l: 2x15 S/L R hip abd x10 with 5 sec holds up/down 2 inches Side plank: 10 second holds R side activation x 5   PATIENT EDUCATION:  Education details: PT POC/goals/HEP initiated Person educated: Patient Education method: Explanation Education comprehension: verbalized understanding  HOME EXERCISE PROGRAM: Access Code: [email or web address] URL: [email or web address] Date: 07/08/2023 Prepared by: Max Fickle  Exercises - Standing Single Leg Stance with Counter Support  - 1 x daily - 5 x weekly - 5 reps - 20 hold - Heel Raises with Counter Support  - 1 x daily - 5 x weekly - 2 sets - 12 reps - Supine Bridge  - 1 x daily - 5 x weekly - 2  sets - 10 reps - 5 hold - Sidelying Hip Abduction  - 1 x daily - 5 x weekly - 2 sets - 10 reps  ASSESSMENT:  CLINICAL IMPRESSION: Progressed R LE strengthening today and pt able to tolerate well.  Continued manual ttherapy interventions for MTPs in R gastroc (lateral > medial head) today and R ant tib; pt tolerated well.  Pt overall is reporting improvement in sx.  Continued to progress gluteal and whole LE CKC mm retraining.  Pt should continue to benefit from skilled PT interventions to address the impairments listed below to promote return to PLOF.  OBJECTIVE IMPAIRMENTS: Abnormal gait, decreased activity tolerance, decreased balance, decreased ROM, decreased strength, impaired perceived functional ability, and pain.   ACTIVITY LIMITATIONS: standing, squatting, stairs, locomotion level, and jogging CCW  PARTICIPATION LIMITATIONS: community activity, occupation, yard work, and regular exercise at the gym  PERSONAL FACTORS: Time since onset of injury/illness/exacerbation are also affecting patient's functional outcome.   REHAB POTENTIAL: Good  CLINICAL DECISION MAKING: Stable/uncomplicated  EVALUATION COMPLEXITY: Low   GOALS: Goals reviewed with patient? Yes  SHORT TERM GOALS: Target date: 07/24/23 Pt will be able to resume LE strengthening at her gym without being limited by pain Baseline: only doing Upper body strength work Goal status: INITIAL  LONG TERM GOALS: Target date: 08/10/23  Improve FOTO to >81 indicating pt able to perform her daily activities without being limited by R LE sx Baseline: 72 at initial eval Goal status: INITIAL  2.  Improve R hip strength 1/2 MMT grade to promote improved ability to jog CCW while showing dogs with <3/10 R LE pain Baseline:  Goal status: INITIAL  3.  Pt will be able to assist 70 lb dogs into her van without <3/10 R LE pain Baseline: needs assistance Goal status: INITIAL    PLAN:  PT FREQUENCY: 1-2x/week  PT DURATION: 6  weeks  PLANNED INTERVENTIONS: Patient/Family education, Balance training, Joint mobilization, Therapeutic exercises, Therapeutic activity, Neuromuscular re-education, Gait training, and Self Care  PLAN FOR NEXT SESSION: continue R LE strengthening (gluteal mm emphasis) and CKC LE strengthening manual therapy R gastroc  Max Fickle, PT, DPT, OCS Ardine Bjork, PT 07/13/2023, 1:47 PM

## 2023-07-15 ENCOUNTER — Encounter: Payer: Self-pay | Admitting: Family Medicine

## 2023-07-15 ENCOUNTER — Ambulatory Visit: Payer: Medicare HMO | Admitting: Family Medicine

## 2023-07-15 ENCOUNTER — Ambulatory Visit: Payer: Medicare HMO

## 2023-07-15 VITALS — BP 110/76 | HR 58 | Ht 65.0 in | Wt 150.0 lb

## 2023-07-15 DIAGNOSIS — M79604 Pain in right leg: Secondary | ICD-10-CM

## 2023-07-15 DIAGNOSIS — M25561 Pain in right knee: Secondary | ICD-10-CM | POA: Diagnosis not present

## 2023-07-15 DIAGNOSIS — R29898 Other symptoms and signs involving the musculoskeletal system: Secondary | ICD-10-CM

## 2023-07-15 DIAGNOSIS — M79661 Pain in right lower leg: Secondary | ICD-10-CM

## 2023-07-15 NOTE — Progress Notes (Signed)
   I, Stevenson Clinch, CMA acting as a scribe for Clementeen Graham, MD.  Janet Brock is a 67 y.o. female who presents to [facility] at [facility] today for f/u R knee/calf pain. Pt was last seen by Dr. Denyse Amass on 06/03/23 and was taught HEP and referred to PT, completing 5 visits.  Today, pt reports overall improvement of sx since last visit. PT and HEP have been helpful, Going for PT twice weekly and compliant with daily HEP. Continues to have muscular type pain with ascending stairs or walking upward on an incline. No pain with descending stairs. Denies new or worsening sx since last visit.   Dx imaging: 06/03/23 R knee XR  Pertinent review of systems: No fevers or chills  Relevant historical information: Hypothyroidism   Exam:  BP 110/76   Pulse (!) 58   Ht 5\' 5"  (1.651 m)   Wt 150 lb (68 kg)   LMP 12/21/2013   SpO2 98%   BMI 24.96 kg/m  General: Well Developed, well nourished, and in no acute distress.   MSK: Right calf normal-appearing normal motion.     Assessment and Plan: 67 y.o. female with right posterior knee and calf pain.  She has had some physical therapy since the last visit and her symptoms overall are improving.  Her pain is much more localized to the posterior calf especially with concentric exercises such as climbing stairs.  She has had 4 physical therapy sessions so far.  Plan to give it another month and if not improved we can proceed to MRI of the calf or tib-fib if needed.  Overall I think she is going to improve.  She will keep me updated.   PDMP not reviewed this encounter. No orders of the defined types were placed in this encounter.  No orders of the defined types were placed in this encounter.    Discussed warning signs or symptoms. Please see discharge instructions. Patient expresses understanding.   The above documentation has been reviewed and is accurate and complete Clementeen Graham, M.D.

## 2023-07-15 NOTE — Therapy (Signed)
OUTPATIENT PHYSICAL THERAPY LOWER EXTREMITY TREATMENT    Patient Name: Janet Brock MRN: [id number] DOB:28-Mar-1956, 67 y.o., female Today's Date: 07/15/2023  END OF SESSION:  PT End of Session - 07/15/23 1524     Visit Number 6    Number of Visits 13    Date for PT Re-Evaluation 08/10/23    Authorization Type 2x/wk x 6 weeks, recert 08/10/23    PT Start Time 1420    PT Stop Time 1505    PT Time Calculation (min) 45 min    Activity Tolerance Patient tolerated treatment well    Behavior During Therapy WFL for tasks assessed/performed               Past Medical History:  Diagnosis Date   Anemia    past hx - off Iron   Heart murmur    History of varicella    Hypothyroidism    synthroid  ? hyperthyroid when young and thin and then  dx hypo dx at clinic at work    Migraines    pre menopausal  hx of vicodin  rescue    Past Surgical History:  Procedure Laterality Date   ABLATION     ? year    btl     CATARACT EXTRACTION Bilateral    12/06/2021, 12/20/2021   COLONOSCOPY     approx 2 years ago   HYSTEROSCOPY  05/08/2011   Procedure: HYSTEROSCOPY WITH HYDROTHERMAL ABLATION;  Surgeon: Levi Aland;  Location: [facility];  Service: Gynecology;  Laterality: N/A;   svd       x 2   WISDOM TOOTH EXTRACTION     Patient Active Problem List   Diagnosis Date Noted   Breast lump 05/19/2018   Greater trochanteric bursitis of right hip 11/14/2016   Hypothyroidism     PCP: Dr. Fabian Sharp, MD  REFERRING PROVIDER: Dr. Denyse Amass, MD  REFERRING DIAG:  [phone] (ICD-10-CM) - Right knee pain, unspecified chronicity  M79.661 (ICD-10-CM) - Right calf pain    THERAPY DIAG:  Pain in right leg  Weakness of right lower extremity  Rationale for Evaluation and Treatment: Rehabilitation  ONSET DATE: Labor day weekend 2024  SUBJECTIVE: (From initial evaluation note 06/29/23)  SUBJECTIVE STATEMENT: Pt states she was showing dogs in [place] in August 2024; she travels in a big [place] with her  dogs- has a step stool to get in and out of the [place].  She started noticing her R leg was hurting on that trip.  By 5 days into the trip her leg pain progressively worsened and she was having difficulty walking.  Feels "phantom" pain.  She did not have a MOI.  Onset was gradual.  Overall sx are improving.      She has not had this type of sensation/pain before.  No back pain with the sx.  No L leg sx.  Not tingling, but sx shift around   Location: anterior/lateral R hip, sometimes posterior R thigh/knee/calf pain- posterior knee initially was the worst- getting better  She saw her PCP for a consult when she got back and also sports medicine MD the same day.  They did an ultrasound, x-rays and everything was (-).    She got a few exercises: heel drops, soleus stretch, gastroc stretch.    Agg: going up stairs is hard if her leg is tired; walking/jogging  Allev: topical lidocaine patches- helps; has not used them in a few days.  She has not tried NSAIDs.  Being upright  feels better.    Has tried 1 chiropractor tx- no change.    She does not notice that weight bearing specifically influences her sx- maybe more in the lower leg.  Feels good lying.   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  Has to be able to work with dogs that are up to 70 lbs.  Has to be able to lift into the upper crate in the [place]. Has to run counterclockwise.    Enjoys being active in her yard, property   PERTINENT HISTORY: She shows dogs as a professional hobby She goes to the gym 3x/week- does machines, not doing typical workouts currently bc of sx  PAIN:  Are you having pain? Yes; current 0/10; worst 10/10 (early September- Labor Day); in the past week 2-3/10 at worst   PRECAUTIONS: None  RED FLAGS: None  WEIGHT BEARING  RESTRICTIONS: No  FALLS:  Has patient fallen in last 6 months? 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  DIAGNOSTIC FINDINGS: had x-ray and [place] of knee, both (-)  PATIENT SURVEYS:  FOTO 72/81  COGNITION: Overall cognitive status: Within functional limits for tasks assessed      POSTURE: No Significant postural limitations  Pt able to like supine.  (-) R FADDIR today  PALPATION:  (+)TTP R greater trochanter, (-) TTP R knee soft tissue/mm  LOWER EXTREMITY ROM:  Active ROM Right eval Left eval  Hip flexion normal normal  Hip extension    Hip abduction    Hip adduction    Hip internal rotation 15 deg, 35 deg, Pt notes discomfort with OP normal  Hip external rotation Pt notes discomfort with OP normal  Knee flexion Normal and pain free OP  normal  Knee extension Normal and pain free OP normal  Ankle dorsiflexion    Ankle plantarflexion    Ankle inversion    Ankle eversion     (Blank rows = not tested) Further motion testing of hip will be performed at future visit LOWER EXTREMITY MMT:  MMT (out of 5) Right eval Left eval  Hip flexion    Hip extension    Hip abduction    Hip adduction    Hip internal rotation 4/5Pt notes discomfort 5  Hip external rotation 4/5 Pt notes discomfort 5  Knee flexion 5 5  Knee extension 5 5  Ankle dorsiflexion    Ankle plantarflexion    Ankle inversion    Ankle eversion     (Blank rows = not tested)  LOWER  EXTREMITY SPECIAL TESTS:  Full lower quarter neuro screen deferred to next visit due to time constraints as pt arrived late to apt today (-) lumbar extension/rotation test R and L for reproduction of sx  FUNCTIONAL TESTS:  (+) R LE sx pain with  squat, (+) impaired R SLS motor control compared to R, (+) R lateral hip pain with jogging counter clockwise how she has to show dogs  LUMBAR AROM:  (-) reproduction of pain with lumbar spine AROM flexion, extension, side bend, rotation and (-) with OP.  Further PAM deferred to visit #2 due to time constraints.   TODAY'S TREATMENT:                                                                                                                              DATE: 07/13/23  Subjective: pt repots she saw her orthopedist before PT today; she reports it was a good appointment- she states that he did not think her injury is related to her knee; if she is still having R LE sx at end of Nov will order an MRI.  Her R hip mm and gastroc were sore the next day after last PT visit.  No worse today.  Pain: 0/10 upon arrival.   Objective: Manual Therapy: 20 min STM R gastroc, R ant tib MRE 4 way ankle x10 ea (none painful)  Therapeutic Exercises: 25 min Bridge 2 feet to just R foot with 3-5 sec holds x 10- reviewed for HEP R Standing SLS: cues for foot tripod, upright posture- to promote activation of gluteal mm + ankle/intrinsic foot mm; 5 trials; focused on L hip in flexed position too to fatigue Standing heel raises b/l: 2x15, x10 toes in, x10 toes out Eccentric R heel raises: x15 S/L R hip abd x10 with 5 sec holds up/down 2 inches- reviewed for HEP Side plank: 10 second holds R side activation x 5- reviewed for HEP  Pt ed for ways to space out her HEP during the day; discussed local recommendations/options for yoga classes to inquire about for when she finishes PT as this is something she has been hoping to return to   PATIENT EDUCATION:  Education  details: PT POC/goals/HEP initiated Person educated: Patient Education method: Explanation Education comprehension: verbalized understanding  HOME EXERCISE PROGRAM: Access Code: [email or web address] URL: [email or web address] Date: 07/08/2023 Prepared by: Max Fickle  Exercises - Standing Single Leg Stance with Counter Support  - 1 x daily - 5 x weekly - 5 reps - 20 hold - Heel Raises with Counter Support  - 1 x daily - 5 x weekly - 2 sets - 12 reps - Supine Bridge  - 1 x daily - 5 x weekly - 2 sets - 10 reps - 5 hold - Sidelying Hip Abduction  - 1 x daily - 5 x weekly - 2 sets - 10 reps  ASSESSMENT:  CLINICAL IMPRESSION: Progressed R distal LE strengthening today and pt reports no onset of pain during or at end of sesssion.  Continued manual ttherapy interventions for MTPs in R gastroc (lateral > medial head) today and R ant tib; pt tolerated well.  Pt overall is reporting improvement in sx.  Continued to progress gluteal and whole LE  CKC mm retraining.  Pt should continue to benefit from skilled PT interventions to address the impairments listed below to promote return to PLOF.  OBJECTIVE IMPAIRMENTS: Abnormal gait, decreased activity tolerance, decreased balance, decreased ROM, decreased strength, impaired perceived functional ability, and pain.   ACTIVITY LIMITATIONS: standing, squatting, stairs, locomotion level, and jogging CCW  PARTICIPATION LIMITATIONS: community activity, occupation, yard work, and regular exercise at the gym  PERSONAL FACTORS: Time since onset of injury/illness/exacerbation are also affecting patient's functional outcome.   REHAB POTENTIAL: Good  CLINICAL DECISION MAKING: Stable/uncomplicated  EVALUATION COMPLEXITY: Low   GOALS: Goals reviewed with patient? Yes  SHORT TERM GOALS: Target date: 07/24/23 Pt will be able to resume LE strengthening at her gym without being limited by pain Baseline: only doing Upper body strength work Goal status:  INITIAL  LONG TERM GOALS: Target date: 08/10/23  Improve FOTO to >81 indicating pt able to perform her daily activities without being limited by R LE sx Baseline: 72 at initial eval Goal status: INITIAL  2.  Improve R hip strength 1/2 MMT grade to promote improved ability to jog CCW while showing dogs with <3/10 R LE pain Baseline:  Goal status: INITIAL  3.  Pt will be able to assist 70 lb dogs into her van without <3/10 R LE pain Baseline: needs assistance Goal status: INITIAL    PLAN:  PT FREQUENCY: 1-2x/week  PT DURATION: 6 weeks  PLANNED INTERVENTIONS: Patient/Family education, Balance training, Joint mobilization, Therapeutic exercises, Therapeutic activity, Neuromuscular re-education, Gait training, and Self Care  PLAN FOR NEXT SESSION: continue R LE strengthening (gluteal mm emphasis) and CKC LE strengthening manual therapy R gastroc  Max Fickle, PT, DPT, OCS Ardine Bjork, PT 07/15/2023, 3:25 PM

## 2023-07-15 NOTE — Therapy (Deleted)
error 

## 2023-07-15 NOTE — Patient Instructions (Signed)
Thank you for coming in today.   Continue home exercises with PT.   As you transition out of PT Yoga or Pilates is a good idea.   If not better next step could be an MRI.   Let me know.   Keep me updated.

## 2023-07-20 ENCOUNTER — Ambulatory Visit: Payer: Medicare HMO

## 2023-07-20 DIAGNOSIS — M79604 Pain in right leg: Secondary | ICD-10-CM

## 2023-07-20 DIAGNOSIS — M25561 Pain in right knee: Secondary | ICD-10-CM | POA: Diagnosis not present

## 2023-07-20 DIAGNOSIS — R29898 Other symptoms and signs involving the musculoskeletal system: Secondary | ICD-10-CM | POA: Diagnosis not present

## 2023-07-20 DIAGNOSIS — M79661 Pain in right lower leg: Secondary | ICD-10-CM | POA: Diagnosis not present

## 2023-07-20 NOTE — Therapy (Signed)
OUTPATIENT PHYSICAL THERAPY LOWER EXTREMITY TREATMENT    Patient Name: Janet Brock MRN: [id number] DOB:Feb 03, 1956, 67 y.o., female Today's Date: 07/20/2023  END OF SESSION:  PT End of Session - 07/20/23 1422     Visit Number 7    Number of Visits 13    Date for PT Re-Evaluation 08/10/23    Authorization Type 2x/wk x 6 weeks, recert 08/10/23    PT Start Time 1130    PT Stop Time 1215    PT Time Calculation (min) 45 min    Activity Tolerance Patient tolerated treatment well    Behavior During Therapy WFL for tasks assessed/performed                Past Medical History:  Diagnosis Date   Anemia    past hx - off Iron   Heart murmur    History of varicella    Hypothyroidism    synthroid  ? hyperthyroid when young and thin and then  dx hypo dx at clinic at work    Migraines    pre menopausal  hx of vicodin  rescue    Past Surgical History:  Procedure Laterality Date   ABLATION     ? year    btl     CATARACT EXTRACTION Bilateral    12/06/2021, 12/20/2021   COLONOSCOPY     approx 2 years ago   HYSTEROSCOPY  05/08/2011   Procedure: HYSTEROSCOPY WITH HYDROTHERMAL ABLATION;  Surgeon: Levi Aland;  Location: [facility];  Service: Gynecology;  Laterality: N/A;   svd       x 2   WISDOM TOOTH EXTRACTION     Patient Active Problem List   Diagnosis Date Noted   Breast lump 05/19/2018   Greater trochanteric bursitis of right hip 11/14/2016   Hypothyroidism     PCP: Dr. Fabian Sharp, MD  REFERRING PROVIDER: Dr. Denyse Amass, MD  REFERRING DIAG:  [phone] (ICD-10-CM) - Right knee pain, unspecified chronicity  M79.661 (ICD-10-CM) - Right calf pain    THERAPY DIAG:  Pain in right leg  Weakness of right lower extremity  Rationale for Evaluation and Treatment: Rehabilitation  ONSET DATE: Labor day weekend 2024  SUBJECTIVE: (From initial evaluation note 06/29/23)  SUBJECTIVE STATEMENT: Pt states she was showing dogs in [place] in August 2024; she travels in a big [place] with her  dogs- has a step stool to get in and out of the [place].  She started noticing her R leg was hurting on that trip.  By 5 days into the trip her leg pain progressively worsened and she was having difficulty walking.  Feels "phantom" pain.  She did not have a MOI.  Onset was gradual.  Overall sx are improving.      She has not had this type of sensation/pain before.  No back pain with the sx.  No L leg sx.  Not tingling, but sx shift around   Location: anterior/lateral R hip, sometimes posterior R thigh/knee/calf pain- posterior knee initially was the worst- getting better  She saw her PCP for a consult when she got back and also sports medicine MD the same day.  They did an ultrasound, x-rays and everything was (-).    She got a few exercises: heel drops, soleus stretch, gastroc stretch.    Agg: going up stairs is hard if her leg is tired; walking/jogging  Allev: topical lidocaine patches- helps; has not used them in a few days.  She has not tried NSAIDs.  Being  upright feels better.    Has tried 1 chiropractor tx- no change.    She does not notice that weight bearing specifically influences her sx- maybe more in the lower leg.  Feels good lying.   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  Has to be able to work with dogs that are up to 70 lbs.  Has to be able to lift into the upper crate in the [place]. Has to run counterclockwise.    Enjoys being active in her yard, property   PERTINENT HISTORY: She shows dogs as a professional hobby She goes to the gym 3x/week- does machines, not doing typical workouts currently bc of sx  PAIN:  Are you having pain? Yes; current 0/10; worst 10/10 (early September- Labor Day); in the past week 2-3/10 at worst   PRECAUTIONS: None  RED FLAGS: None  WEIGHT BEARING  RESTRICTIONS: No  FALLS:  Has patient fallen in last 6 months? 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  DIAGNOSTIC FINDINGS: had x-ray and [place] of knee, both (-)  PATIENT SURVEYS:  FOTO 72/81  COGNITION: Overall cognitive status: Within functional limits for tasks assessed      POSTURE: No Significant postural limitations  Pt able to like supine.  (-) R FADDIR today  PALPATION:  (+)TTP R greater trochanter, (-) TTP R knee soft tissue/mm  LOWER EXTREMITY ROM:  Active ROM Right eval Left eval  Hip flexion normal normal  Hip extension    Hip abduction    Hip adduction    Hip internal rotation 15 deg, 35 deg, Pt notes discomfort with OP normal  Hip external rotation Pt notes discomfort with OP normal  Knee flexion Normal and pain free OP  normal  Knee extension Normal and pain free OP normal  Ankle dorsiflexion    Ankle plantarflexion    Ankle inversion    Ankle eversion     (Blank rows = not tested) Further motion testing of hip will be performed at future visit LOWER EXTREMITY MMT:  MMT (out of 5) Right eval Left eval  Hip flexion    Hip extension    Hip abduction    Hip adduction    Hip internal rotation 4/5Pt notes discomfort 5  Hip external rotation 4/5 Pt notes discomfort 5  Knee flexion 5 5  Knee extension 5 5  Ankle dorsiflexion    Ankle plantarflexion    Ankle inversion    Ankle eversion     (Blank rows = not tested)  LOWER  EXTREMITY SPECIAL TESTS:  Full lower quarter neuro screen deferred to next visit due to time constraints as pt arrived late to apt today (-) lumbar extension/rotation test R and L for reproduction of sx  FUNCTIONAL TESTS:  (+) R LE sx pain  with squat, (+) impaired R SLS motor control compared to R, (+) R lateral hip pain with jogging counter clockwise how she has to show dogs  LUMBAR AROM:  (-) reproduction of pain with lumbar spine AROM flexion, extension, side bend, rotation and (-) with OP.  Further PAM deferred to visit #2 due to time constraints.   TODAY'S TREATMENT:                                                                                                                              DATE: 07/20/23  Subjective: Pt states she has been working on her HEP consistently.  The side plank doesn't feel good so she stopped that one.  Otherwise the exercises for her R hip and lower leg feel challenging.  She walked a dog Saturday through the field x 15 minutes on a training walk on uneven terrain and her leg felt pretty good during.  Yesterday she was sore, but today slightly better.  Pain: 0/10 upon arrival.   Objective: Astrick sign: pushing off R LE going up steps reproduces her typical R lateral lower leg pain  Manual Therapy: 10 min STM R gastroc (lateral head) and peroneal mm Tibiofibular mob: proximal A/P and PA glides Gr III/IV MRE 4 way ankle x10 ea (none painful) Pt reports decreased pain after manual therapy with R LE push off going up step  Therapeutic Exercises: 30 min Ascend/descend stairs 4x Standing heel raises b/l: 2x15, x10 toes in, x20 toes out R SLS to warrior III to R SLS: for lower leg mm and deep hip mm dynamic stabilization: 5 rounds to fatigue; repeat on L 3 rounds to fatigue Lateral monster walk: blue TB around distal thigh: 4 laps in gym then keep band with single leg mini squat L with R toe taps into hip abd- 3 rounds to fatigue; repeat other side 3 rounds  to fatigue  Bridge 2 feet to just R foot with 3-5 sec holds x 10- reviewed for HEP R Standing SLS: cues for foot tripod, upright posture- to promote activation of gluteal mm + ankle/intrinsic foot mm; 5 trials- reviewed for HEP  Eccentric R heel raises: x15- not today S/L R hip abd x10 with 5 sec holds up/down 2 inches- reviewed for HEP Side plank: 10 second holds R side activation x 5- reviewed for HEP   Pt ed for ways to space out her HEP during the day; discussed local recommendations/options for yoga classes to inquire about for when she finishes PT as this is something she has been hoping to return to   PATIENT EDUCATION:  Education details: PT POC/goals/HEP initiated Person educated: Patient Education method: Explanation Education comprehension: verbalized understanding  HOME EXERCISE PROGRAM: Access Code: [email or web address] URL: [email or web address] Date: 07/08/2023 Prepared by: Max Fickle  Exercises - Standing Single Leg Stance with Counter Support  - 1 x daily - 5 x weekly - 5 reps - 20 hold - Heel Raises with Counter  Support  - 1 x daily - 5 x weekly - 2 sets - 12 reps - Supine Bridge  - 1 x daily - 5 x weekly - 2 sets - 10 reps - 5 hold - Sidelying Hip Abduction  - 1 x daily - 5 x weekly - 2 sets - 10 reps  ASSESSMENT:  CLINICAL IMPRESSION: Pt reports decreased pain and improved ability to push up stairs on R LE after manual therapy today.  Pt overall is reporting improvement in sx.  Continued to progress gluteal and whole LE CKC mm retraining to promoted improved strength/endurance/power as she gets ready to travel again in the next week for a series of dog training shows.  Pt should continue to benefit from skilled PT interventions to address the impairments listed below to promote return to PLOF.  OBJECTIVE IMPAIRMENTS: Abnormal gait, decreased activity tolerance, decreased balance, decreased ROM, decreased strength, impaired perceived functional ability,  and pain.   ACTIVITY LIMITATIONS: standing, squatting, stairs, locomotion level, and jogging CCW  PARTICIPATION LIMITATIONS: community activity, occupation, yard work, and regular exercise at the gym  PERSONAL FACTORS: Time since onset of injury/illness/exacerbation are also affecting patient's functional outcome.   REHAB POTENTIAL: Good  CLINICAL DECISION MAKING: Stable/uncomplicated  EVALUATION COMPLEXITY: Low   GOALS: Goals reviewed with patient? Yes  SHORT TERM GOALS: Target date: 07/24/23 Pt will be able to resume LE strengthening at her gym without being limited by pain Baseline: only doing Upper body strength work Goal status: INITIAL  LONG TERM GOALS: Target date: 08/10/23  Improve FOTO to >81 indicating pt able to perform her daily activities without being limited by R LE sx Baseline: 72 at initial eval Goal status: INITIAL  2.  Improve R hip strength 1/2 MMT grade to promote improved ability to jog CCW while showing dogs with <3/10 R LE pain Baseline:  Goal status: INITIAL  3.  Pt will be able to assist 70 lb dogs into her van without <3/10 R LE pain Baseline: needs assistance Goal status: INITIAL    PLAN:  PT FREQUENCY: 1-2x/week  PT DURATION: 6 weeks  PLANNED INTERVENTIONS: Patient/Family education, Balance training, Joint mobilization, Therapeutic exercises, Therapeutic activity, Neuromuscular re-education, Gait training, and Self Care  PLAN FOR NEXT SESSION: continue R distal LE strengthening (gluteal mm emphasis) and CKC LE strengthening; manual therapy for R distal LE and gastroc/peroneals as needed  Max Fickle, PT, DPT, OCS Ardine Bjork, PT 07/20/2023, 2:23 PM

## 2023-07-22 ENCOUNTER — Ambulatory Visit: Payer: Medicare HMO

## 2023-07-22 DIAGNOSIS — M25561 Pain in right knee: Secondary | ICD-10-CM

## 2023-07-22 DIAGNOSIS — M79604 Pain in right leg: Secondary | ICD-10-CM | POA: Diagnosis not present

## 2023-07-22 DIAGNOSIS — M79661 Pain in right lower leg: Secondary | ICD-10-CM | POA: Diagnosis not present

## 2023-07-22 DIAGNOSIS — R29898 Other symptoms and signs involving the musculoskeletal system: Secondary | ICD-10-CM

## 2023-07-22 NOTE — Therapy (Signed)
OUTPATIENT PHYSICAL THERAPY LOWER EXTREMITY TREATMENT    Patient Name: Janet Brock MRN: [id number] DOB:1956/07/05, 67 y.o., female Today's Date: 07/22/2023  END OF SESSION:  PT End of Session - 07/22/23 1116     Visit Number 8    Number of Visits 13    Date for PT Re-Evaluation 08/10/23    Authorization Type 2x/wk x 6 weeks, recert 08/10/23    PT Start Time 1116    PT Stop Time 1216    PT Time Calculation (min) 60 min    Activity Tolerance Patient tolerated treatment well    Behavior During Therapy WFL for tasks assessed/performed                 Past Medical History:  Diagnosis Date   Anemia    past hx - off Iron   Heart murmur    History of varicella    Hypothyroidism    synthroid  ? hyperthyroid when young and thin and then  dx hypo dx at clinic at work    Migraines    pre menopausal  hx of vicodin  rescue    Past Surgical History:  Procedure Laterality Date   ABLATION     ? year    btl     CATARACT EXTRACTION Bilateral    12/06/2021, 12/20/2021   COLONOSCOPY     approx 2 years ago   HYSTEROSCOPY  05/08/2011   Procedure: HYSTEROSCOPY WITH HYDROTHERMAL ABLATION;  Surgeon: Levi Aland;  Location: [facility];  Service: Gynecology;  Laterality: N/A;   svd       x 2   WISDOM TOOTH EXTRACTION     Patient Active Problem List   Diagnosis Date Noted   Breast lump 05/19/2018   Greater trochanteric bursitis of right hip 11/14/2016   Hypothyroidism     PCP: Dr. Fabian Sharp, MD  REFERRING PROVIDER: Dr. Denyse Amass, MD  REFERRING DIAG:  [phone] (ICD-10-CM) - Right knee pain, unspecified chronicity  M79.661 (ICD-10-CM) - Right calf pain    THERAPY DIAG:  Pain in right leg  Weakness of right lower extremity  Right knee pain, unspecified chronicity  Rationale for Evaluation and Treatment: Rehabilitation  ONSET DATE: Labor day weekend 2024  SUBJECTIVE: (From initial evaluation note 06/29/23)  SUBJECTIVE STATEMENT: Pt states she was showing dogs in [place] in  August 2024; she travels in a big [place] with her dogs- has a step stool to get in and out of the [place].  She started noticing her R leg was hurting on that trip.  By 5 days into the trip her leg pain progressively worsened and she was having difficulty walking.  Feels "phantom" pain.  She did not have a MOI.  Onset was gradual.  Overall sx are improving.      She has not had this type of sensation/pain before.  No back pain with the sx.  No L leg sx.  Not tingling, but sx shift around   Location: anterior/lateral R hip, sometimes posterior R thigh/knee/calf pain- posterior knee initially was the worst- getting better  She saw her PCP for a consult when she got back and also sports medicine MD the same day.  They did an ultrasound, x-rays and everything was (-).    She got a few exercises: heel drops, soleus stretch, gastroc stretch.    Agg: going up stairs is hard if her leg is tired; walking/jogging  Allev: topical lidocaine patches- helps; has not used them in a few days.  She has not tried NSAIDs.  Being upright feels better.    Has tried 1 chiropractor tx- no change.    She does not notice that weight bearing specifically influences her sx- maybe more in the lower leg.  Feels good lying.   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  Has to be able to work with dogs that are up to 70 lbs.  Has to be able to lift into the upper crate in the [place]. Has to run counterclockwise.    Enjoys being active in her yard, property   PERTINENT HISTORY: She shows dogs as a professional hobby She goes to the gym 3x/week- does machines, not doing typical workouts currently bc of sx  PAIN:  Are you having pain? Yes; current 0/10; worst 10/10 (early September- Labor Day); in the past week 2-3/10 at worst   PRECAUTIONS:  None  RED FLAGS: None  WEIGHT BEARING RESTRICTIONS: No  FALLS:  Has patient fallen in last 6 months? 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  DIAGNOSTIC FINDINGS: had x-ray and [place] of knee, both (-)  PATIENT SURVEYS:  FOTO 72/81  COGNITION: Overall cognitive status: Within functional limits for tasks assessed      POSTURE: No Significant postural limitations  Pt able to like supine.  (-) R FADDIR today  PALPATION:  (+)TTP R greater trochanter, (-) TTP R knee soft tissue/mm  LOWER EXTREMITY ROM:  Active ROM Right eval Left eval  Hip flexion normal normal  Hip extension    Hip abduction    Hip adduction    Hip internal rotation 15 deg, 35 deg, Pt notes discomfort with OP normal  Hip external rotation Pt notes discomfort with OP normal  Knee flexion Normal and pain free OP  normal  Knee extension Normal and pain free OP normal  Ankle dorsiflexion    Ankle plantarflexion    Ankle inversion    Ankle eversion     (Blank rows = not tested) Further motion testing of hip will be performed at future visit LOWER EXTREMITY MMT:  MMT (out of 5) Right eval Left eval  Hip flexion    Hip extension    Hip abduction    Hip adduction    Hip internal rotation 4/5Pt notes discomfort 5  Hip external rotation 4/5 Pt notes discomfort 5  Knee flexion 5 5  Knee extension 5 5  Ankle dorsiflexion    Ankle plantarflexion    Ankle inversion    Ankle eversion      (Blank rows = not tested)  LOWER EXTREMITY SPECIAL TESTS:  Full lower quarter neuro screen deferred to next visit due to time constraints as pt arrived late to apt today (-) lumbar extension/rotation test R and L for reproduction of sx  FUNCTIONAL  TESTS:  (+) R LE sx pain with squat, (+) impaired R SLS motor control compared to R, (+) R lateral hip pain with jogging counter clockwise how she has to show dogs  LUMBAR AROM:  (-) reproduction of pain with lumbar spine AROM flexion, extension, side bend, rotation and (-) with OP.  Further PAM deferred to visit #2 due to time constraints.   TODAY'S TREATMENT:                                                                                                                              DATE: 07/22/23  Subjective: Pt states she has pain/soreness in R calf and Lateral thigh which is more at as the day progresses and after the PT sessions.  Pain: soreness upon arrival.   Objective: Astrick sign: pushing off R LE going up steps reproduces her typical R lateral lower leg pain TA: 20 mins PT focused on pt education to improve muscle length, significance of stretching to reduce lactic acid overload, Massage gun use.  PT found calf, Quads, and ITB tightness. Discussed it with Pt.   TE: 40 Pt education fro proper technique through out the session.  Sci-Fit for 7 mins at 75SPM BLE only for warm up Prone Calf stretch 2 x 60 secs  Seated Hamstring stretch 2 x 60 secs Prone Quad stretch 1 x 60 secs and 2 x 30 secs  Side lying Quad Lumb/ITB stretch x 60 secs Forward flexed ITBand stretch in standing 2 x 60 secs STS with BTB 2 x 60 secs Heel raises in 3 foot position 1 x 60 secs each.  Same for HEP twice a day followed by same strengthening exs from prior sessions.    Not today:  STM R gastroc (lateral head) and peroneal mm Tibiofibular mob: proximal A/P and PA glides Gr III/IV MRE 4 way ankle x10 ea (none painful) Pt reports decreased pain after  manual therapy with R LE push off going up step Ascend/descend stairs 4x Standing heel raises b/l: 2x15, x10 toes in, x20 toes out R SLS to warrior III to R SLS: for lower leg mm and deep hip mm dynamic stabilization: 5 rounds to fatigue; repeat on L 3 rounds to fatigue Lateral monster walk: blue TB around distal thigh: 4 laps in gym then keep band with single leg mini squat L with R toe taps into hip abd- 3 rounds to fatigue; repeat other side 3 rounds to fatigue Bridge 2 feet to just R foot with 3-5 sec holds x 10- reviewed for HEP R Standing SLS: cues for foot tripod, upright posture- to promote activation of gluteal mm + ankle/intrinsic foot mm; 5 trials- reviewed for HEP Eccentric R heel raises: x15- not today S/L R hip abd x10 with 5 sec holds up/down 2 inches- reviewed for HEP Side plank: 10 second holds R side activation x 5- reviewed for HEP Pt ed for ways to space out her HEP during  the day; discussed local recommendations/options for yoga classes to inquire about for when she finishes PT as this is something she has been hoping to return to   PATIENT EDUCATION:  Education details: PT POC/goals/HEP initiated Person educated: Patient Education method: Explanation Education comprehension: verbalized understanding  HOME EXERCISE PROGRAM:  Access Code: [email or web address] URL: [email or web address] Prepared by: Janet Berlin  Exercises - Long Sitting Calf Stretch with Strap  - 2 x daily - 7 x weekly - 1 sets - 2 reps - 60secs hold - Seated Table Hamstring Stretch  - 2 x daily - 7 x weekly - 1 sets - 2 reps - 60secs hold - Prone Quadriceps Stretch with Strap  - 2 x daily - 7 x weekly - 1 sets - 2 reps - 60secs hold - Sidelying Quadratus Lumborum Stretch on Table  - 2 x daily - 7 x weekly - 1 sets - 2 reps - 60secs hold - Standing ITB Stretch  - 2 x daily - 7 x weekly - 1 sets - 2 reps - 60secs hold  ASSESSMENT:  CLINICAL IMPRESSION: Pt reports of no soreness in RLE by the end  of the session.  PT spent extensive time to educate pt about her symptoms are possible from Over use and lactic acid accumulation. PT introduced pt to LE stretches and reset the muscles. Pt demonstrates good understanding of the massage gun use, and HEP. Pt may benefit from Ice pack to Calf for soreness management. Pt should continue to benefit from skilled PT interventions to address the impairments listed below to promote return to PLOF.  OBJECTIVE IMPAIRMENTS: Abnormal gait, decreased activity tolerance, decreased balance, decreased ROM, decreased strength, impaired perceived functional ability, and pain.   ACTIVITY LIMITATIONS: standing, squatting, stairs, locomotion level, and jogging CCW  PARTICIPATION LIMITATIONS: community activity, occupation, yard work, and regular exercise at the gym  PERSONAL FACTORS: Time since onset of injury/illness/exacerbation are also affecting patient's functional outcome.   REHAB POTENTIAL: Good  CLINICAL DECISION MAKING: Stable/uncomplicated  EVALUATION COMPLEXITY: Low   GOALS: Goals reviewed with patient? Yes  SHORT TERM GOALS: Target date: 07/24/23 Pt will be able to resume LE strengthening at her gym without being limited by pain Baseline: only doing Upper body strength work Goal status: INITIAL  LONG TERM GOALS: Target date: 08/10/23  Improve FOTO to >81 indicating pt able to perform her daily activities without being limited by R LE sx Baseline: 72 at initial eval Goal status: INITIAL  2.  Improve R hip strength 1/2 MMT grade to promote improved ability to jog CCW while showing dogs with <3/10 R LE pain Baseline:  Goal status: INITIAL  3.  Pt will be able to assist 70 lb dogs into her van without <3/10 R LE pain Baseline: needs assistance Goal status: INITIAL    PLAN:  PT FREQUENCY: 1-2x/week  PT DURATION: 6 weeks  PLANNED INTERVENTIONS: Patient/Family education, Balance training, Joint mobilization, Therapeutic exercises,  Therapeutic activity, Neuromuscular re-education, Gait training, and Self Care  PLAN FOR NEXT SESSION: continue R distal LE strengthening (gluteal mm emphasis) and CKC LE strengthening; manual therapy for R distal LE and gastroc/peroneals as needed  Janet Berlin PT DPT 12:57 PM,07/22/23

## 2023-08-12 ENCOUNTER — Ambulatory Visit: Payer: Medicare HMO | Attending: Family Medicine

## 2023-08-12 DIAGNOSIS — R2681 Unsteadiness on feet: Secondary | ICD-10-CM | POA: Diagnosis not present

## 2023-08-12 DIAGNOSIS — M79604 Pain in right leg: Secondary | ICD-10-CM | POA: Diagnosis not present

## 2023-08-12 DIAGNOSIS — M5459 Other low back pain: Secondary | ICD-10-CM | POA: Insufficient documentation

## 2023-08-12 DIAGNOSIS — R29898 Other symptoms and signs involving the musculoskeletal system: Secondary | ICD-10-CM | POA: Diagnosis not present

## 2023-08-12 DIAGNOSIS — R262 Difficulty in walking, not elsewhere classified: Secondary | ICD-10-CM | POA: Insufficient documentation

## 2023-08-12 DIAGNOSIS — M6281 Muscle weakness (generalized): Secondary | ICD-10-CM | POA: Diagnosis not present

## 2023-08-12 NOTE — Therapy (Signed)
OUTPATIENT PHYSICAL THERAPY LOWER EXTREMITY TREATMENT    Patient Name: Janet Brock MRN: [id number] DOB:30-Jan-1956, 67 y.o., female Today's Date: 08/12/2023  END OF SESSION:  PT End of Session - 08/12/23 1509     Visit Number 9    Number of Visits 13    Date for PT Re-Evaluation 08/10/23    Authorization Type 2x/wk x 6 weeks, recert 08/10/23    PT Start Time 1122    PT Stop Time 1210    PT Time Calculation (min) 48 min    Activity Tolerance Patient tolerated treatment well    Behavior During Therapy WFL for tasks assessed/performed                  Past Medical History:  Diagnosis Date   Anemia    past hx - off Iron   Heart murmur    History of varicella    Hypothyroidism    synthroid  ? hyperthyroid when young and thin and then  dx hypo dx at clinic at work    Migraines    pre menopausal  hx of vicodin  rescue    Past Surgical History:  Procedure Laterality Date   ABLATION     ? year    btl     CATARACT EXTRACTION Bilateral    12/06/2021, 12/20/2021   COLONOSCOPY     approx 2 years ago   HYSTEROSCOPY  05/08/2011   Procedure: HYSTEROSCOPY WITH HYDROTHERMAL ABLATION;  Surgeon: Levi Aland;  Location: [facility];  Service: Gynecology;  Laterality: N/A;   svd       x 2   WISDOM TOOTH EXTRACTION     Patient Active Problem List   Diagnosis Date Noted   Breast lump 05/19/2018   Greater trochanteric bursitis of right hip 11/14/2016   Hypothyroidism     PCP: Dr. Fabian Sharp, MD  REFERRING PROVIDER: Dr. Denyse Amass, MD  REFERRING DIAG:  [phone] (ICD-10-CM) - Right knee pain, unspecified chronicity  M79.661 (ICD-10-CM) - Right calf pain    THERAPY DIAG:  Difficulty in walking, not elsewhere classified  Muscle weakness (generalized)  Other low back pain  Unsteadiness on feet  Rationale for Evaluation and Treatment: Rehabilitation  ONSET DATE: Labor day weekend 2024  SUBJECTIVE: (From initial evaluation note 06/29/23)  SUBJECTIVE STATEMENT: Pt states  she was showing dogs in [place] in August 2024; she travels in a big [place] with her dogs- has a step stool to get in and out of the [place].  She started noticing her R leg was hurting on that trip.  By 5 days into the trip her leg pain progressively worsened and she was having difficulty walking.  Feels "phantom" pain.  She did not have a MOI.  Onset was gradual.  Overall sx are improving.      She has not had this type of sensation/pain before.  No back pain with the sx.  No L leg sx.  Not tingling, but sx shift around   Location: anterior/lateral R hip, sometimes posterior R thigh/knee/calf pain- posterior knee initially was the worst- getting better  She saw her PCP for a consult when she got back and also sports medicine MD the same day.  They did an ultrasound, x-rays and everything was (-).    She got a few exercises: heel drops, soleus stretch, gastroc stretch.    Agg: going up stairs is hard if her leg is tired; walking/jogging  Allev: topical lidocaine patches- helps; has not used them in  a few days.  She has not tried NSAIDs.  Being upright feels better.    Has tried 1 chiropractor tx- no change.    She does not notice that weight bearing specifically influences her sx- maybe more in the lower leg.  Feels good lying.   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  Has to be able to work with dogs that are up to 70 lbs.  Has to be able to lift into the upper crate in the [place]. Has to run counterclockwise.    Enjoys being active in her yard, property   PERTINENT HISTORY: She shows dogs as a professional hobby She goes to the gym 3x/week- does machines, not doing typical workouts currently bc of sx  PAIN:  Are you having pain? Yes; current 0/10; worst 10/10 (early September- Labor Day); in the past week 2-3/10 at  worst   PRECAUTIONS: None  RED FLAGS: None  WEIGHT BEARING RESTRICTIONS: No  FALLS:  Has patient fallen in last 6 months? 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  DIAGNOSTIC FINDINGS: had x-ray and [place] of knee, both (-)  PATIENT SURVEYS:  FOTO 72/81  COGNITION: Overall cognitive status: Within functional limits for tasks assessed      POSTURE: No Significant postural limitations  Pt able to like supine.  (-) R FADDIR today  PALPATION:  (+)TTP R greater trochanter, (-) TTP R knee soft tissue/mm  LOWER EXTREMITY ROM:  Active ROM Right eval Left eval  Hip flexion normal normal  Hip extension    Hip abduction    Hip adduction    Hip internal rotation 15 deg, 35 deg, Pt notes discomfort with OP normal  Hip external rotation Pt notes discomfort with OP normal  Knee flexion Normal and pain free OP  normal  Knee extension Normal and pain free OP normal  Ankle dorsiflexion    Ankle plantarflexion    Ankle inversion    Ankle eversion     (Blank rows = not tested) Further motion testing of hip will be performed at future visit LOWER EXTREMITY MMT:  MMT (out of 5) Right eval Left eval  Hip flexion    Hip extension    Hip abduction    Hip adduction    Hip internal rotation 4/5Pt notes discomfort 5  Hip external rotation 4/5 Pt notes discomfort 5  Knee flexion 5 5  Knee extension 5 5  Ankle dorsiflexion    Ankle plantarflexion    Ankle  inversion    Ankle eversion     (Blank rows = not tested)  LOWER EXTREMITY SPECIAL TESTS:  Full lower quarter neuro screen deferred to next visit due to time constraints as pt arrived late to apt today (-) lumbar extension/rotation test R and L for reproduction  of sx  FUNCTIONAL TESTS:  (+) R LE sx pain with squat, (+) impaired R SLS motor control compared to R, (+) R lateral hip pain with jogging counter clockwise how she has to show dogs  LUMBAR AROM:  (-) reproduction of pain with lumbar spine AROM flexion, extension, side bend, rotation and (-) with OP.  Further PAM deferred to visit #2 due to time constraints.   TODAY'S TREATMENT:                                                                                                                              DATE: 08/12/23  Subjective: " I have pain in my groin when I ascend steps and when I sit on the couch slouched. My calf pain is gone since you gave me all those stretches."  Objective: Astrick sign: pushing off R LE going up steps reproduces her typical R lateral lower leg pain TA: 10 mins PT performed quick assessment of spine and R hip to find the source of the pain and then discussed findings and hip and spine anatomy to improve pt's insight of her condition.   PA glide to T/S and L/S grade I/II/III x 2 mins R hip jt mobs inf/Post/Later glides x 2 min Hip Distraction x 5 reps  TE: 30 Pt education for proper technique through out the session.  Sci-Fit for 8 mins at level 3 75 SPM BLE only for warm up Prone on elbow hold x 60 secs. Quads/Iliopsoas stretch 2 x 60 secs hold Figure 4 stretch in quadruped to Prone x 60 secs for hip jt stretch SLR 2 x 10 reps Side lying Hip abd 2 x 10 reps Hip ext in prone 2 x 10 reps STS 2 x 10 reps Step tapping x 10 reps Step up on RLE 20 reps Ascend and descend steps x 60 secs Quad Lumborum strengthening with #3 standing on the step 2 x 10 reps RLE suspended while standing on LLE on  step   with #5 to RLE HEP reviewed and updated.        Prone Calf stretch 2 x 60 secs  Seated Hamstring stretch 2 x 60 secs Prone Quad stretch 1 x 60 secs and 2 x 30 secs  Side lying Quad Lumb/ITB stretch x 60 secs Forward flexed ITBand stretch in standing 2 x 60 secs STS with BTB 2 x 60 secs Heel raises in 3 foot position 1 x 60 secs each.  Same for HEP twice a day followed by same strengthening exs from prior sessions.    Not today:  STM R gastroc (lateral head) and peroneal mm Tibiofibular mob: proximal A/P and PA glides Gr III/IV MRE 4 way ankle x10 ea (none painful) Pt reports decreased pain after manual therapy with R LE push off going up step Ascend/descend stairs 4x Standing heel raises b/l: 2x15, x10 toes in, x20 toes out R SLS to warrior III to R SLS: for  lower leg mm and deep hip mm dynamic stabilization: 5 rounds to fatigue; repeat on L 3 rounds to fatigue Lateral monster walk: blue TB around distal thigh: 4 laps in gym then keep band with single leg mini squat L with R toe taps into hip abd- 3 rounds to fatigue; repeat other side 3 rounds to fatigue Bridge 2 feet to just R foot with 3-5 sec holds x 10- reviewed for HEP R Standing SLS: cues for foot tripod, upright posture- to promote activation of gluteal mm + ankle/intrinsic foot mm; 5 trials- reviewed for HEP Eccentric R heel raises: x15- not today S/L R hip abd x10 with 5 sec holds up/down 2 inches- reviewed for HEP Side plank: 10 second holds R side activation x 5- reviewed for HEP Pt ed for ways to space out her HEP during the day; discussed local recommendations/options for yoga classes to inquire about for when she finishes PT as this is something she has been hoping to return to   PATIENT EDUCATION:  Education details: PT POC/goals/HEP initiated Person educated: Patient Education method: Explanation Education comprehension: verbalized understanding  HOME EXERCISE PROGRAM:  Access Code: [email or web address] URL:  [email or web address] Date: 08/12/2023 Prepared by: Janet Berlin  Exercises - Hip External Rotation Stretch  - 1 x daily - 7 x weekly - 3 sets - 10 reps - Hip Flexor Stretch at Edge of Bed  - 1 x daily - 7 x weekly - 3 sets - 10 reps  ASSESSMENT:  CLINICAL IMPRESSION: Pt reports of pain with Ascending steps and postural pain. Pt demonstrates positive scour test of R hip and Posterior displaced T11/T12/L1. Pt introduced to Hip jit and muscle stretches to improve jt integrity. Pt provided Postural education to prevent nerve irritation. Pt able to ascend and descend steps x 5 reps in gym without pain at the end of the session. Pt tol Tx well. Pt demonstrates excellent carryover of education and HEP.  Pt should continue to benefit from skilled PT interventions to address the impairments listed below to promote return to PLOF.  OBJECTIVE IMPAIRMENTS: Abnormal gait, decreased activity tolerance, decreased balance, decreased ROM, decreased strength, impaired perceived functional ability, and pain.   ACTIVITY LIMITATIONS: standing, squatting, stairs, locomotion level, and jogging CCW  PARTICIPATION LIMITATIONS: community activity, occupation, yard work, and regular exercise at the gym  PERSONAL FACTORS: Time since onset of injury/illness/exacerbation are also affecting patient's functional outcome.   REHAB POTENTIAL: Good  CLINICAL DECISION MAKING: Stable/uncomplicated  EVALUATION COMPLEXITY: Low   GOALS: Goals reviewed with patient? Yes  SHORT TERM GOALS: Target date: 07/24/23 Pt will be able to resume LE strengthening at her gym without being limited by pain Baseline: only doing Upper body strength work Goal status: INITIAL  LONG TERM GOALS: Target date: 08/10/23  Improve FOTO to >81 indicating pt able to perform her daily activities without being limited by R LE sx Baseline: 72 at initial eval Goal status: INITIAL  2.  Improve R hip strength 1/2 MMT grade to promote  improved ability to jog CCW while showing dogs with <3/10 R LE pain Baseline:  Goal status: INITIAL  3.  Pt will be able to assist 70 lb dogs into her van without <3/10 R LE pain Baseline: needs assistance Goal status: INITIAL    PLAN:  PT FREQUENCY: 1-2x/week  PT DURATION: 6 weeks  PLANNED INTERVENTIONS: Patient/Family education, Balance training, Joint mobilization, Therapeutic exercises, Therapeutic activity, Neuromuscular re-education, Gait training, and Self Care  PLAN FOR NEXT SESSION: continue R  distal LE strengthening (gluteal mm emphasis) and CKC LE strengthening; manual therapy for R distal LE and gastroc/peroneals as needed  Janet Berlin PT DPT 3:10 PM,08/12/23

## 2023-08-13 DIAGNOSIS — Z961 Presence of intraocular lens: Secondary | ICD-10-CM | POA: Diagnosis not present

## 2023-08-13 DIAGNOSIS — D3132 Benign neoplasm of left choroid: Secondary | ICD-10-CM | POA: Diagnosis not present

## 2023-08-17 ENCOUNTER — Ambulatory Visit: Payer: Medicare HMO

## 2023-08-17 DIAGNOSIS — M79604 Pain in right leg: Secondary | ICD-10-CM

## 2023-08-17 DIAGNOSIS — R262 Difficulty in walking, not elsewhere classified: Secondary | ICD-10-CM | POA: Diagnosis not present

## 2023-08-17 DIAGNOSIS — M6281 Muscle weakness (generalized): Secondary | ICD-10-CM | POA: Diagnosis not present

## 2023-08-17 DIAGNOSIS — M5459 Other low back pain: Secondary | ICD-10-CM | POA: Diagnosis not present

## 2023-08-17 DIAGNOSIS — R2681 Unsteadiness on feet: Secondary | ICD-10-CM | POA: Diagnosis not present

## 2023-08-17 DIAGNOSIS — R29898 Other symptoms and signs involving the musculoskeletal system: Secondary | ICD-10-CM | POA: Diagnosis not present

## 2023-08-17 NOTE — Therapy (Signed)
OUTPATIENT PHYSICAL THERAPY LOWER EXTREMITY TREATMENT/PROGRESS NOTE/RE-CERTIFICATION THROUGH 09/27/22   Patient Name: Janet Brock MRN: [id number] DOB:Jun 17, 1956, 67 y.o., female Today's Date: 08/18/2023  END OF SESSION:  PT End of Session - 08/17/23 1421     Visit Number 10    Number of Visits 20    Date for PT Re-Evaluation 09/28/23    Authorization Type PN/recert done on 08/18/23; request additional 2x/wk x 6 weeks, recert 09/27/22    PT Start Time 1250    PT Stop Time 1335    PT Time Calculation (min) 45 min    Activity Tolerance Patient tolerated treatment well    Behavior During Therapy WFL for tasks assessed/performed                   Past Medical History:  Diagnosis Date   Anemia    past hx - off Iron   Heart murmur    History of varicella    Hypothyroidism    synthroid  ? hyperthyroid when young and thin and then  dx hypo dx at clinic at work    Migraines    pre menopausal  hx of vicodin  rescue    Past Surgical History:  Procedure Laterality Date   ABLATION     ? year    btl     CATARACT EXTRACTION Bilateral    12/06/2021, 12/20/2021   COLONOSCOPY     approx 2 years ago   HYSTEROSCOPY  05/08/2011   Procedure: HYSTEROSCOPY WITH HYDROTHERMAL ABLATION;  Surgeon: Levi Aland;  Location: [facility];  Service: Gynecology;  Laterality: N/A;   svd       x 2   WISDOM TOOTH EXTRACTION     Patient Active Problem List   Diagnosis Date Noted   Breast lump 05/19/2018   Greater trochanteric bursitis of right hip 11/14/2016   Hypothyroidism     PCP: Dr. Fabian Sharp, MD  REFERRING PROVIDER: Dr. Denyse Amass, MD  REFERRING DIAG:  [phone] (ICD-10-CM) - Right knee pain, unspecified chronicity  M79.661 (ICD-10-CM) - Right calf pain    THERAPY DIAG:  Pain in right leg  Weakness of right lower extremity  Rationale for Evaluation and Treatment: Rehabilitation  ONSET DATE: Labor day weekend 2024  SUBJECTIVE: (From initial evaluation note 06/29/23)  SUBJECTIVE  STATEMENT: Pt states she was showing dogs in [place] in August 2024; she travels in a big [place] with her dogs- has a step stool to get in and out of the [place].  She started noticing her R leg was hurting on that trip.  By 5 days into the trip her leg pain progressively worsened and she was having difficulty walking.  Feels "phantom" pain.  She did not have a MOI.  Onset was gradual.  Overall sx are improving.      She has not had this type of sensation/pain before.  No back pain with the sx.  No L leg sx.  Not tingling, but sx shift around   Location: anterior/lateral R hip, sometimes posterior R thigh/knee/calf pain- posterior knee initially was the worst- getting better  She saw her PCP for a consult when she got back and also sports medicine MD the same day.  They did an ultrasound, x-rays and everything was (-).    She got a few exercises: heel drops, soleus stretch, gastroc stretch.    Agg: going up stairs is hard if her leg is tired; walking/jogging  Allev: topical lidocaine patches- helps; has not used them in  a few days.  She has not tried NSAIDs.  Being upright feels better.    Has tried 1 chiropractor tx- no change.    She does not notice that weight bearing specifically influences her sx- maybe more in the lower leg.  Feels good lying.   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  Has to be able to work with dogs that are up to 70 lbs.  Has to be able to lift into the upper crate in the [place]. Has to run counterclockwise.    Enjoys being active in her yard, property   PERTINENT HISTORY: She shows dogs as a professional hobby She goes to the gym 3x/week- does machines, not doing typical workouts currently bc of sx  PAIN:  Are you having pain? Yes; current 0/10; worst 10/10 (early September- Labor Day); in  the past week 2-3/10 at worst   PRECAUTIONS: None  RED FLAGS: None  WEIGHT BEARING RESTRICTIONS: No  FALLS:  Has patient fallen in last 6 months? 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  DIAGNOSTIC FINDINGS: had x-ray and [place] of knee, both (-)  PATIENT SURVEYS:  FOTO 72/81  COGNITION: Overall cognitive status: Within functional limits for tasks assessed      POSTURE: No Significant postural limitations  Pt able to like supine.  (-) R FADDIR today  PALPATION:  (+)TTP R greater trochanter, (-) TTP R knee soft tissue/mm  LOWER EXTREMITY ROM:  Active ROM Right eval Left eval  Hip flexion normal normal  Hip extension    Hip abduction    Hip adduction    Hip internal rotation 15 deg, 35 deg, Pt notes discomfort with OP normal  Hip external rotation Pt notes discomfort with OP normal  Knee flexion Normal and pain free OP  normal  Knee extension Normal and pain free OP normal  Ankle dorsiflexion    Ankle plantarflexion    Ankle inversion    Ankle eversion     (Blank rows = not tested) Further motion testing of hip will be performed at future visit LOWER EXTREMITY MMT:  MMT (out of 5) Right eval Left eval  Hip flexion    Hip extension    Hip abduction    Hip adduction    Hip internal rotation 4/5Pt notes discomfort 5  Hip external rotation 4/5 Pt notes discomfort 5  Knee flexion 5 5  Knee extension 5 5  Ankle dorsiflexion    Ankle  plantarflexion    Ankle inversion    Ankle eversion     (Blank rows = not tested)  LOWER EXTREMITY SPECIAL TESTS:  Full lower quarter neuro screen deferred to next visit due to time constraints as pt arrived late to apt today (-) lumbar extension/rotation test R and L for reproduction  of sx  FUNCTIONAL TESTS:  (+) R LE sx pain with squat, (+) impaired R SLS motor control compared to R, (+) R lateral hip pain with jogging counter clockwise how she has to show dogs  LUMBAR AROM:  (-) reproduction of pain with lumbar spine AROM flexion, extension, side bend, rotation and (-) with OP.  Further PAM deferred to visit #2 due to time constraints.   TODAY'S TREATMENT:                                                                                                                              DATE: 08/17/23  Subjective: Pt notes her R distal LE sx have fully resolved.  The stretches she learned in PT last time have been helping.  The quadruped one is very challenging.  Pain 2/10 R hip Astrick sign: pushing off R LE going up steps reproduces her typical R lateral lower leg pain  Objective: R hip abd 4/5 R hip ext 4/5 MMT  Manual Therapy: 25 min (+) limited/painful anterior R hip ER (FABER type position) R hip inferior glide: Gr IV @ 90 deg hip flex R hip anterior glide: prone figure 4 and prone with hip in slight extension Gr IV Improved R hip ER PROM noted after Prone hip IR/ER x10 PROM R iliopsoas/rectus femoris stretch with PT   Therapeutic Exercise: 20 minutes Sci-Fit for 8 mins at level 3 75 SPM BLE for hp AROM/mm activation at beginning of session Step ups: RLLR 6 inch x15 Yoga warrior I/high lunge (L LE front, R back)/addition of lateral trunk flex to L- practiced variations, discussed benefit for R hip mobility Supine figure 4 x 5 Hamstring stretch with PT 1 min RLE suspended while standing on LLE on  step  with #5 to RLE- reviewed this, pt ordered weights for performing this at  home   Not today: Prone on elbow hold x 60 secs. Quads/Iliopsoas stretch 2 x 60 secs hold Figure 4 stretch in quadruped to Prone x 60 secs for hip jt stretch SLR 2 x 10 reps Side lying Hip abd 2 x 10 reps Hip ext in prone 2 x 10 reps STS 2 x 10 reps Step tapping x 10 reps Step up on RLE 20 reps Ascend and descend steps x 60 secs Quad Lumborum strengthening with #3 standing on the step 2 x 10 reps Prone Calf stretch 2 x 60 secs  Seated Hamstring stretch 2 x 60 secs Prone Quad stretch 1 x 60 secs and 2 x 30 secs  Side lying Quad Lumb/ITB stretch x 60 secs Forward flexed ITBand stretch in standing 2 x 60 secs STS with BTB 2 x 60 secs Heel raises in 3 foot position 1 x 60 secs each.  Same for HEP twice a day followed by same strengthening exs from prior sessions.    Not today:  STM R gastroc (lateral head) and peroneal mm Tibiofibular mob: proximal A/P and PA  glides Gr III/IV MRE 4 way ankle x10 ea (none painful) Pt reports decreased pain after manual therapy with R LE push off going up step Ascend/descend stairs 4x Standing heel raises b/l: 2x15, x10 toes in, x20 toes out R SLS to warrior III to R SLS: for lower leg mm and deep hip mm dynamic stabilization: 5 rounds to fatigue; repeat on L 3 rounds to fatigue Lateral monster walk: blue TB around distal thigh: 4 laps in gym then keep band with single leg mini squat L with R toe taps into hip abd- 3 rounds to fatigue; repeat other side 3 rounds to fatigue Bridge 2 feet to just R foot with 3-5 sec holds x 10- reviewed for HEP R Standing SLS: cues for foot tripod, upright posture- to promote activation of gluteal mm + ankle/intrinsic foot mm; 5 trials- reviewed for HEP Eccentric R heel raises: x15- not today S/L R hip abd x10 with 5 sec holds up/down 2 inches- reviewed for HEP Side plank: 10 second holds R side activation x 5- reviewed for HEP Pt ed for ways to space out her HEP during the day; discussed local recommendations/options  for yoga classes to inquire about for when she finishes PT as this is something she has been hoping to return to   PATIENT EDUCATION:  Education details: PT POC/goals/HEP/proper technique throughout session Person educated: Patient Education method: Explanation Education comprehension: verbalized understanding  HOME EXERCISE PROGRAM:  Access Code: [email or web address] URL: [email or web address] Date: 08/12/2023 Prepared by: Janet Berlin  Exercises - Hip External Rotation Stretch  - 1 x daily - 7 x weekly - 3 sets - 10 reps - Hip Flexor Stretch at Edge of Bed  - 1 x daily - 7 x weekly - 3 sets - 10 reps  ASSESSMENT:  CLINICAL IMPRESSION: Pt recently returned to PT after 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  She functionally is reporting improvement in ability to perform her dog showing activities including walking and lifting dogs. Her primary c/o remains difficulty going up stairs with R LE.  Her R calf strain has improved; remaining PT impairments include: R hip pain, decreased LE strength, and R hip hypomobility.  Pt should continue to benefit from skilled PT interventions to address the impairments to promote return to PLOF.  OBJECTIVE IMPAIRMENTS: Abnormal gait, decreased activity tolerance, decreased balance, decreased ROM, decreased strength, impaired perceived functional ability, and pain.   ACTIVITY LIMITATIONS: standing, squatting, stairs, locomotion level, and jogging CCW  PARTICIPATION LIMITATIONS: community activity, occupation, yard work, and regular exercise at the gym  PERSONAL FACTORS: Time since onset of injury/illness/exacerbation are also affecting patient's functional outcome.   REHAB POTENTIAL: Good  CLINICAL DECISION MAKING: Stable/uncomplicated  EVALUATION COMPLEXITY: Low   GOALS: Goals reviewed with patient? Yes  SHORT TERM GOALS: Target date: 07/24/23 Pt will be able to resume LE strengthening at her gym without  being limited by pain Baseline: only doing Upper body strength work; 11/25- in progress Goal status: INITIAL  LONG TERM GOALS: Target date: 09/27/22  Improve FOTO to >81 indicating pt able to perform her daily activities without being limited by R LE sx Baseline: 72 at initial eval, 11/25- in progress Goal status: INITIAL  2.  Improve R hip strength 1/2 MMT grade to promote improved ability to jog CCW while showing dogs with <3/10 R LE pain Baseline: 11/25 in progress Goal status: INITIAL  3.  Pt will be able to assist 70 lb dogs into her  van without <3/10 R LE pain Baseline: needs assistance;  Goal status:MET    PLAN:  PT FREQUENCY: 1-2x/week  PT DURATION: 6 weeks  PLANNED INTERVENTIONS: Patient/Family education, Balance training, Joint mobilization, Therapeutic exercises, Therapeutic activity, Neuromuscular re-education, Gait training, and Self Care  PLAN FOR NEXT SESSION: continue R distal LE strengthening (gluteal mm emphasis) and CKC LE strengthening; manual therapy for R distal LE and gastroc/peroneals as needed  Max Fickle, PT, DPT, OCS 10:23 AM,08/18/23

## 2023-08-24 ENCOUNTER — Ambulatory Visit: Payer: Medicare HMO | Attending: Family Medicine

## 2023-08-24 DIAGNOSIS — M79604 Pain in right leg: Secondary | ICD-10-CM | POA: Insufficient documentation

## 2023-08-24 DIAGNOSIS — R29898 Other symptoms and signs involving the musculoskeletal system: Secondary | ICD-10-CM | POA: Insufficient documentation

## 2023-08-24 NOTE — Therapy (Signed)
OUTPATIENT PHYSICAL THERAPY LOWER EXTREMITY TREATMENT/PROGRESS NOTE/RE-CERTIFICATION THROUGH 09/28/23   Patient Name: Janet Brock MRN: [id number] DOB:March 04, 1956, 67 y.o., female Today's Date: 08/24/2023  END OF SESSION:  PT End of Session - 08/24/23 1125     Visit Number 11    Number of Visits 20    Date for PT Re-Evaluation 09/28/23    Authorization Type PN/recert done on 08/18/23; request additional 2x/wk x 6 weeks, recert 09/27/22    PT Start Time 1120    PT Stop Time 1200    PT Time Calculation (min) 40 min    Activity Tolerance Patient tolerated treatment well    Behavior During Therapy WFL for tasks assessed/performed                   Past Medical History:  Diagnosis Date   Anemia    past hx - off Iron   Heart murmur    History of varicella    Hypothyroidism    synthroid  ? hyperthyroid when young and thin and then  dx hypo dx at clinic at work    Migraines    pre menopausal  hx of vicodin  rescue    Past Surgical History:  Procedure Laterality Date   ABLATION     ? year    btl     CATARACT EXTRACTION Bilateral    12/06/2021, 12/20/2021   COLONOSCOPY     approx 2 years ago   HYSTEROSCOPY  05/08/2011   Procedure: HYSTEROSCOPY WITH HYDROTHERMAL ABLATION;  Surgeon: Levi Aland;  Location: [facility];  Service: Gynecology;  Laterality: N/A;   svd       x 2   WISDOM TOOTH EXTRACTION     Patient Active Problem List   Diagnosis Date Noted   Breast lump 05/19/2018   Greater trochanteric bursitis of right hip 11/14/2016   Hypothyroidism     PCP: Dr. Fabian Sharp, MD  REFERRING PROVIDER: Dr. Denyse Amass, MD  REFERRING DIAG:  [phone] (ICD-10-CM) - Right knee pain, unspecified chronicity  M79.661 (ICD-10-CM) - Right calf pain    THERAPY DIAG:  Pain in right leg  Weakness of right lower extremity  Rationale for Evaluation and Treatment: Rehabilitation  ONSET DATE: Labor day weekend 2024  SUBJECTIVE: (From initial evaluation note 06/29/23)  SUBJECTIVE  STATEMENT: Pt states she was showing dogs in [place] in August 2024; she travels in a big [place] with her dogs- has a step stool to get in and out of the [place].  She started noticing her R leg was hurting on that trip.  By 5 days into the trip her leg pain progressively worsened and she was having difficulty walking.  Feels "phantom" pain.  She did not have a MOI.  Onset was gradual.  Overall sx are improving.      She has not had this type of sensation/pain before.  No back pain with the sx.  No L leg sx.  Not tingling, but sx shift around   Location: anterior/lateral R hip, sometimes posterior R thigh/knee/calf pain- posterior knee initially was the worst- getting better  She saw her PCP for a consult when she got back and also sports medicine MD the same day.  They did an ultrasound, x-rays and everything was (-).    She got a few exercises: heel drops, soleus stretch, gastroc stretch.    Agg: going up stairs is hard if her leg is tired; walking/jogging  Allev: topical lidocaine patches- helps; has not used them in  a few days.  She has not tried NSAIDs.  Being upright feels better.    Has tried 1 chiropractor tx- no change.    She does not notice that weight bearing specifically influences her sx- maybe more in the lower leg.  Feels good lying.   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  Has to be able to work with dogs that are up to 70 lbs.  Has to be able to lift into the upper crate in the [place]. Has to run counterclockwise.    Enjoys being active in her yard, property   PERTINENT HISTORY: She shows dogs as a professional hobby She goes to the gym 3x/week- does machines, not doing typical workouts currently bc of sx  PAIN:  Are you having pain? Yes; current 0/10; worst 10/10 (early September- Labor Day); in  the past week 2-3/10 at worst   PRECAUTIONS: None  RED FLAGS: None  WEIGHT BEARING RESTRICTIONS: No  FALLS:  Has patient fallen in last 6 months? 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  DIAGNOSTIC FINDINGS: had x-ray and [place] of knee, both (-)  PATIENT SURVEYS:  FOTO 72/81  COGNITION: Overall cognitive status: Within functional limits for tasks assessed      POSTURE: No Significant postural limitations  Pt able to like supine.  (-) R FADDIR today  PALPATION:  (+)TTP R greater trochanter, (-) TTP R knee soft tissue/mm  LOWER EXTREMITY ROM:  Active ROM Right eval Left eval  Hip flexion normal normal  Hip extension    Hip abduction    Hip adduction    Hip internal rotation 15 deg, 35 deg, Pt notes discomfort with OP normal  Hip external rotation Pt notes discomfort with OP normal  Knee flexion Normal and pain free OP  normal  Knee extension Normal and pain free OP normal  Ankle dorsiflexion    Ankle plantarflexion    Ankle inversion    Ankle eversion     (Blank rows = not tested) Further motion testing of hip will be performed at future visit LOWER EXTREMITY MMT:  MMT (out of 5) Right eval Left eval  Hip flexion    Hip extension    Hip abduction    Hip adduction    Hip internal rotation 4/5Pt notes discomfort 5  Hip external rotation 4/5 Pt notes discomfort 5  Knee flexion 5 5  Knee extension 5 5  Ankle dorsiflexion    Ankle  plantarflexion    Ankle inversion    Ankle eversion     (Blank rows = not tested)  LOWER EXTREMITY SPECIAL TESTS:  Full lower quarter neuro screen deferred to next visit due to time constraints as pt arrived late to apt today (-) lumbar extension/rotation test R and L for reproduction  of sx  FUNCTIONAL TESTS:  (+) R LE sx pain with squat, (+) impaired R SLS motor control compared to R, (+) R lateral hip pain with jogging counter clockwise how she has to show dogs  LUMBAR AROM:  (-) reproduction of pain with lumbar spine AROM flexion, extension, side bend, rotation and (-) with OP.  Further PAM deferred to visit #2 due to time constraints.   TODAY'S TREATMENT:                                                                                                                              DATE: 08/24/23  Subjective: Pt did a lot of ladder climbing and this made her hip sore after.  Today it is no worse.  She used the recumbent bike at the gym, also tried low intensity leg press.      Pain 2/10 R hip Astrick sign: pushing off R LE going up steps reproduces her typical R lateral lower leg pain  Objective: R hip abd 4/5 R hip ext 4/5 MMT  Manual Therapy: 15 min (+) limited/painful anterior R hip ER (FABER type position) R hip inferior glide: Gr IV @ 90 deg hip flex R hip anterior glide: prone figure 4 and prone with hip in slight extension Gr IV Improved R hip ER PROM noted after Prone hip IR/ER x10 PROM R iliopsoas/rectus femoris stretch with PT   Therapeutic Exercise: 25 minutes Nustep for 8 mins at level 3-4 75 SPM BLE for hp AROM/mm activation at beginning of session Step ups: RLLR 6 inch x15 Yoga warrior I/high lunge (L LE front, R back)/addition of lateral trunk flex to L- practiced variations, discussed benefit for R hip mobility; then progressed to split squat 2x12 ea LE (to fatigue) Medicine ball "goblet" squats to table with 5 second pause at bottome: 2x8 (8# med ball) Total  gym: 2x15 b/l, 2x10 R LE only Lateral lunge: 2x 12 b/l Supine figure 4x- pt reports discomfort at end range (used this motion as assessment after man tx too) Hamstring stretch with PT 1 min- not today RLE suspended while standing on LLE on  step  with #5 to RLE- reviewed this, pt ordered weights for performing this at home   Not today: Prone on elbow hold x 60 secs. Quads/Iliopsoas stretch 2 x 60 secs hold Figure 4 stretch in quadruped to Prone x 60 secs for hip jt stretch SLR 2 x 10 reps Side lying Hip abd 2 x 10 reps Hip ext in prone 2 x 10 reps STS 2 x 10 reps Step tapping x 10 reps Step up on RLE 20 reps Ascend and descend steps x 60 secs Quad Lumborum strengthening with #3 standing on the step 2 x 10 reps Prone Calf stretch 2 x 60 secs  Seated Hamstring stretch 2 x 60 secs Prone Quad stretch 1 x 60 secs and 2 x 30 secs  Side lying Quad Lumb/ITB stretch x 60  secs Forward flexed ITBand stretch in standing 2 x 60 secs STS with BTB 2 x 60 secs Heel raises in 3 foot position 1 x 60 secs each.  Same for HEP twice a day followed by same strengthening exs from prior sessions.    Not today:  STM R gastroc (lateral head) and peroneal mm Tibiofibular mob: proximal A/P and PA glides Gr III/IV MRE 4 way ankle x10 ea (none painful) Pt reports decreased pain after manual therapy with R LE push off going up step Ascend/descend stairs 4x Standing heel raises b/l: 2x15, x10 toes in, x20 toes out R SLS to warrior III to R SLS: for lower leg mm and deep hip mm dynamic stabilization: 5 rounds to fatigue; repeat on L 3 rounds to fatigue Lateral monster walk: blue TB around distal thigh: 4 laps in gym then keep band with single leg mini squat L with R toe taps into hip abd- 3 rounds to fatigue; repeat other side 3 rounds to fatigue Bridge 2 feet to just R foot with 3-5 sec holds x 10- reviewed for HEP R Standing SLS: cues for foot tripod, upright posture- to promote activation of gluteal mm +  ankle/intrinsic foot mm; 5 trials- reviewed for HEP Eccentric R heel raises: x15- not today S/L R hip abd x10 with 5 sec holds up/down 2 inches- reviewed for HEP Side plank: 10 second holds R side activation x 5- reviewed for HEP Pt ed for ways to space out her HEP during the day; discussed local recommendations/options for yoga classes to inquire about for when she finishes PT as this is something she has been hoping to return to   PATIENT EDUCATION:  Education details: PT POC/goals/HEP/proper technique throughout session Person educated: Patient Education method: Explanation Education comprehension: verbalized understanding  HOME EXERCISE PROGRAM:  Access Code: [email or web address] URL: [email or web address] Date: 08/12/2023 Prepared by: Janet Berlin  Exercises - Hip External Rotation Stretch  - 1 x daily - 7 x weekly - 3 sets - 10 reps - Hip Flexor Stretch at Edge of Bed  - 1 x daily - 7 x weekly - 3 sets - 10 reps  ASSESSMENT:  CLINICAL IMPRESSION: Pt's R hip ER improved after joint mobilization intervention today.  Able to tolerate R proximal LE/hip mm strengthening well; she does fatigue more quickly on R vs L with lateral lunges and demonstrates asymmetrical motor control with split squat- more difficult with R LE in front vs L.  Her primary c/o remains difficulty going up stairs with R LE.  Her R calf strain has improved; remaining PT impairments include: R hip pain, decreased LE strength, and R hip hypomobility.  Pt should continue to benefit from skilled PT interventions to address the impairments to promote return to PLOF.  OBJECTIVE IMPAIRMENTS: Abnormal gait, decreased activity tolerance, decreased balance, decreased ROM, decreased strength, impaired perceived functional ability, and pain.   ACTIVITY LIMITATIONS: standing, squatting, stairs, locomotion level, and jogging CCW  PARTICIPATION LIMITATIONS: community activity, occupation, yard work, and regular exercise at  the gym  PERSONAL FACTORS: Time since onset of injury/illness/exacerbation are also affecting patient's functional outcome.   REHAB POTENTIAL: Good  CLINICAL DECISION MAKING: Stable/uncomplicated  EVALUATION COMPLEXITY: Low   GOALS: Goals reviewed with patient? Yes  SHORT TERM GOALS: Target date: 07/24/23 Pt will be able to resume LE strengthening at her gym without being limited by pain Baseline: only doing Upper body strength work; 11/25- in progress Goal status: INITIAL  LONG TERM GOALS: Target date: 09/27/22  Improve FOTO to >81 indicating pt able to perform her daily activities without being limited by R LE sx Baseline: 72 at initial eval, 11/25- in progress Goal status: INITIAL  2.  Improve R hip strength 1/2 MMT grade to promote improved ability to jog CCW while showing dogs with <3/10 R LE pain Baseline: 11/25 in progress Goal status: INITIAL  3.  Pt will be able to assist 70 lb dogs into her van without <3/10 R LE pain Baseline: needs assistance;  Goal status:MET    PLAN:  PT FREQUENCY: 1-2x/week  PT DURATION: 6 weeks  PLANNED INTERVENTIONS: Patient/Family education, Balance training, Joint mobilization, Therapeutic exercises, Therapeutic activity, Neuromuscular re-education, Gait training, and Self Care  PLAN FOR NEXT SESSION: continue R distal LE strengthening (gluteal mm emphasis) and CKC LE strengthening; manual therapy for R distal LE and gastroc/peroneals as needed  Max Fickle, PT, DPT, OCS 11:27 AM,08/24/23

## 2023-08-26 ENCOUNTER — Ambulatory Visit: Payer: Medicare HMO

## 2023-08-26 DIAGNOSIS — R29898 Other symptoms and signs involving the musculoskeletal system: Secondary | ICD-10-CM

## 2023-08-26 DIAGNOSIS — M79604 Pain in right leg: Secondary | ICD-10-CM

## 2023-08-26 NOTE — Therapy (Signed)
OUTPATIENT PHYSICAL THERAPY LOWER EXTREMITY TREATMENT/PROGRESS NOTE/RE-CERTIFICATION THROUGH 09/27/22   Patient Name: Janet Brock MRN: [id number] DOB:02-Jan-1956, 67 y.o., female Today's Date: 08/26/2023  END OF SESSION:  PT End of Session - 08/26/23 1149     Visit Number 12    Number of Visits 20    Date for PT Re-Evaluation 09/28/23    Authorization Type PN/recert done on 08/18/23; request additional 2x/wk x 6 weeks, recert 09/27/22    PT Start Time 1120    PT Stop Time 1200    PT Time Calculation (min) 40 min    Activity Tolerance Patient tolerated treatment well    Behavior During Therapy WFL for tasks assessed/performed                   Past Medical History:  Diagnosis Date   Anemia    past hx - off Iron   Heart murmur    History of varicella    Hypothyroidism    synthroid  ? hyperthyroid when young and thin and then  dx hypo dx at clinic at work    Migraines    pre menopausal  hx of vicodin  rescue    Past Surgical History:  Procedure Laterality Date   ABLATION     ? year    btl     CATARACT EXTRACTION Bilateral    12/06/2021, 12/20/2021   COLONOSCOPY     approx 2 years ago   HYSTEROSCOPY  05/08/2011   Procedure: HYSTEROSCOPY WITH HYDROTHERMAL ABLATION;  Surgeon: Levi Aland;  Location: [facility];  Service: Gynecology;  Laterality: N/A;   svd       x 2   WISDOM TOOTH EXTRACTION     Patient Active Problem List   Diagnosis Date Noted   Breast lump 05/19/2018   Greater trochanteric bursitis of right hip 11/14/2016   Hypothyroidism     PCP: Dr. Fabian Sharp, MD  REFERRING PROVIDER: Dr. Denyse Amass, MD  REFERRING DIAG:  [phone] (ICD-10-CM) - Right knee pain, unspecified chronicity  M79.661 (ICD-10-CM) - Right calf pain    THERAPY DIAG:  No diagnosis found.  Rationale for Evaluation and Treatment: Rehabilitation  ONSET DATE: Labor day weekend 2024  SUBJECTIVE: (From initial evaluation note 06/29/23)  SUBJECTIVE STATEMENT: Pt states she was showing  dogs in [place] in August 2024; she travels in a big [place] with her dogs- has a step stool to get in and out of the [place].  She started noticing her R leg was hurting on that trip.  By 5 days into the trip her leg pain progressively worsened and she was having difficulty walking.  Feels "phantom" pain.  She did not have a MOI.  Onset was gradual.  Overall sx are improving.      She has not had this type of sensation/pain before.  No back pain with the sx.  No L leg sx.  Not tingling, but sx shift around   Location: anterior/lateral R hip, sometimes posterior R thigh/knee/calf pain- posterior knee initially was the worst- getting better  She saw her PCP for a consult when she got back and also sports medicine MD the same day.  They did an ultrasound, x-rays and everything was (-).    She got a few exercises: heel drops, soleus stretch, gastroc stretch.    Agg: going up stairs is hard if her leg is tired; walking/jogging  Allev: topical lidocaine patches- helps; has not used them in a few days.  She has not  tried NSAIDs.  Being upright feels better.    Has tried 1 chiropractor tx- no change.    She does not notice that weight bearing specifically influences her sx- maybe more in the lower leg.  Feels good lying.   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  Has to be able to work with dogs that are up to 70 lbs.  Has to be able to lift into the upper crate in the [place]. Has to run counterclockwise.    Enjoys being active in her yard, property   PERTINENT HISTORY: She shows dogs as a professional hobby She goes to the gym 3x/week- does machines, not doing typical workouts currently bc of sx  PAIN:  Are you having pain? Yes; current 0/10; worst 10/10 (early September- Labor Day); in the past week 2-3/10 at worst    PRECAUTIONS: None  RED FLAGS: None  WEIGHT BEARING RESTRICTIONS: No  FALLS:  Has patient fallen in last 6 months? 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  DIAGNOSTIC FINDINGS: had x-ray and [place] of knee, both (-)  PATIENT SURVEYS:  FOTO 72/81  COGNITION: Overall cognitive status: Within functional limits for tasks assessed      POSTURE: No Significant postural limitations  Pt able to like supine.  (-) R FADDIR today  PALPATION:  (+)TTP R greater trochanter, (-) TTP R knee soft tissue/mm  LOWER EXTREMITY ROM:  Active ROM Right eval Left eval  Hip flexion normal normal  Hip extension    Hip abduction    Hip adduction    Hip internal rotation 15 deg, 35 deg, Pt notes discomfort with OP normal  Hip external rotation Pt notes discomfort with OP normal  Knee flexion Normal and pain free OP  normal  Knee extension Normal and pain free OP normal  Ankle dorsiflexion    Ankle plantarflexion    Ankle inversion    Ankle eversion     (Blank rows = not tested) Further motion testing of hip will be performed at future visit LOWER EXTREMITY MMT:  MMT (out of 5) Right eval Left eval  Hip flexion    Hip extension    Hip abduction    Hip adduction    Hip internal rotation 4/5Pt notes discomfort 5  Hip external rotation 4/5 Pt notes discomfort 5  Knee flexion 5 5  Knee extension 5 5  Ankle dorsiflexion    Ankle plantarflexion    Ankle inversion     Ankle eversion     (Blank rows = not tested)  LOWER EXTREMITY SPECIAL TESTS:  Full lower quarter neuro screen deferred to next visit due to time constraints as pt arrived late to apt today (-) lumbar extension/rotation test R and L for reproduction of sx  FUNCTIONAL TESTS:  (+)  R LE sx pain with squat, (+) impaired R SLS motor control compared to R, (+) R lateral hip pain with jogging counter clockwise how she has to show dogs  LUMBAR AROM:  (-) reproduction of pain with lumbar spine AROM flexion, extension, side bend, rotation and (-) with OP.  Further PAM deferred to visit #2 due to time constraints.   TODAY'S TREATMENT:                                                                                                                              DATE: 08/26/23  Subjective: Pt reports no increase in sx after new strengthening exercises last session     Pain 2/10 R hip Astrick sign: pushing off R LE going up steps reproduces her typical R lateral lower leg pain  Objective: R hip abd 4/5 R hip ext 4/5 MMT  Manual Therapy: 15 min (+) limited/painful anterior R hip ER (FABER type position) R hip inferior glide: Gr IV @ 90 deg hip flex R hip anterior glide: prone figure 4 and prone with hip in slight extension Gr IV Improved R hip ER PROM noted after Prone hip IR/ER x10 PROM and contract/relax in various ROM with PT R iliopsoas/rectus femoris stretch with PT   Therapeutic Exercise: 20 minutes Nustep 8 mins at level 4-7, goal >70 SPM BLE for hip mm interval training 30 seconds to 1 min intervals at level 6-7 then reduce to level 4-5 x 30 seconds to 1 min Step ups: RLLR 6 inch x15 Yoga warrior I/high lunge (L LE front, R back)/addition of lateral trunk flex to L- practiced variations, discussed benefit for R hip mobility Split squats R/L in front: 2x15 ea Lateral lunges: 2x15 R and L (impaired CKC R LE motor control compared to L) Total gym: b/l 2x15, then x10 R LE focusing on motor  control Squats to chair with 5 second holds 2x12  Assessed pt jogging in CCW direction around gym after- notes no increase on R LE pain  Not today: Prone on elbow hold x 60 secs. Quads/Iliopsoas stretch 2 x 60 secs hold Figure 4 stretch in quadruped to Prone x 60 secs for hip jt stretch SLR 2 x 10 reps Side lying Hip abd 2 x 10 reps Hip ext in prone 2 x 10 reps STS 2 x 10 reps Step tapping x 10 reps Step up on RLE 20 reps Ascend and descend steps x 60 secs Quad Lumborum strengthening with #3 standing on the step 2 x 10 reps Prone Calf stretch 2 x 60 secs  Seated Hamstring stretch 2 x 60 secs Prone Quad stretch 1 x 60 secs and 2 x 30 secs  Side lying Quad Lumb/ITB stretch x 60 secs Forward flexed ITBand stretch in standing 2 x 60 secs STS with BTB 2 x 60 secs Heel raises in 3 foot position 1 x 60 secs each.  Same for HEP twice a day followed by  same strengthening exs from prior sessions.    Not today:  STM R gastroc (lateral head) and peroneal mm Tibiofibular mob: proximal A/P and PA glides Gr III/IV MRE 4 way ankle x10 ea (none painful) Pt reports decreased pain after manual therapy with R LE push off going up step Ascend/descend stairs 4x Standing heel raises b/l: 2x15, x10 toes in, x20 toes out R SLS to warrior III to R SLS: for lower leg mm and deep hip mm dynamic stabilization: 5 rounds to fatigue; repeat on L 3 rounds to fatigue Lateral monster walk: blue TB around distal thigh: 4 laps in gym then keep band with single leg mini squat L with R toe taps into hip abd- 3 rounds to fatigue; repeat other side 3 rounds to fatigue Bridge 2 feet to just R foot with 3-5 sec holds x 10- reviewed for HEP R Standing SLS: cues for foot tripod, upright posture- to promote activation of gluteal mm + ankle/intrinsic foot mm; 5 trials- reviewed for HEP Eccentric R heel raises: x15- not today S/L R hip abd x10 with 5 sec holds up/down 2 inches- reviewed for HEP Side plank: 10 second holds R  side activation x 5- reviewed for HEP Pt ed for ways to space out her HEP during the day; discussed local recommendations/options for yoga classes to inquire about for when she finishes PT as this is something she has been hoping to return to   PATIENT EDUCATION:  Education details: PT POC/goals/HEP/proper technique throughout session Person educated: Patient Education method: Explanation Education comprehension: verbalized understanding  HOME EXERCISE PROGRAM:  Access Code: [email or web address] URL: [email or web address] Date: 08/12/2023 Prepared by: Janet Berlin  Exercises - Hip External Rotation Stretch  - 1 x daily - 7 x weekly - 3 sets - 10 reps - Hip Flexor Stretch at Edge of Bed  - 1 x daily - 7 x weekly - 3 sets - 10 reps  ASSESSMENT:  CLINICAL IMPRESSION: Pt tolerated progression of R LE CKC mm retraining well today; focused mainly on strength/endurance type interventions for gluteal mm.  No increase in R hip pain noted at end of session.  Discussed adding leg press into gym routine every other day.  Remaining PT impairments include: R hip pain, decreased LE strength, and R hip hypomobility.  Pt should continue to benefit from skilled PT interventions to address the impairments to promote return to PLOF.  OBJECTIVE IMPAIRMENTS: Abnormal gait, decreased activity tolerance, decreased balance, decreased ROM, decreased strength, impaired perceived functional ability, and pain.   ACTIVITY LIMITATIONS: standing, squatting, stairs, locomotion level, and jogging CCW  PARTICIPATION LIMITATIONS: community activity, occupation, yard work, and regular exercise at the gym  PERSONAL FACTORS: Time since onset of injury/illness/exacerbation are also affecting patient's functional outcome.   REHAB POTENTIAL: Good  CLINICAL DECISION MAKING: Stable/uncomplicated  EVALUATION COMPLEXITY: Low   GOALS: Goals reviewed with patient? Yes  SHORT TERM GOALS: Target date: 07/24/23 Pt will be  able to resume LE strengthening at her gym without being limited by pain Baseline: only doing Upper body strength work; 11/25- in progress Goal status: INITIAL  LONG TERM GOALS: Target date: 09/27/22  Improve FOTO to >81 indicating pt able to perform her daily activities without being limited by R LE sx Baseline: 72 at initial eval, 11/25- in progress Goal status: INITIAL  2.  Improve R hip strength 1/2 MMT grade to promote improved ability to jog CCW while showing dogs with <3/10 R LE pain Baseline: 11/25 in progress Goal status:  INITIAL  3.  Pt will be able to assist 70 lb dogs into her van without <3/10 R LE pain Baseline: needs assistance;  Goal status:MET    PLAN:  PT FREQUENCY: 1-2x/week  PT DURATION: 6 weeks  PLANNED INTERVENTIONS: Patient/Family education, Balance training, Joint mobilization, Therapeutic exercises, Therapeutic activity, Neuromuscular re-education, Gait training, and Self Care  PLAN FOR NEXT SESSION: continue R distal LE strengthening (gluteal mm emphasis) and CKC LE strengthening  Max Fickle, PT, DPT, OCS 11:54 AM,08/26/23

## 2023-08-31 ENCOUNTER — Ambulatory Visit: Payer: Medicare HMO

## 2023-08-31 DIAGNOSIS — R29898 Other symptoms and signs involving the musculoskeletal system: Secondary | ICD-10-CM | POA: Diagnosis not present

## 2023-08-31 DIAGNOSIS — M79604 Pain in right leg: Secondary | ICD-10-CM | POA: Diagnosis not present

## 2023-08-31 NOTE — Therapy (Signed)
OUTPATIENT PHYSICAL THERAPY LOWER EXTREMITY TREATMENT/PROGRESS NOTE/RE-CERTIFICATION THROUGH 09/27/22   Patient Name: Janet Brock MRN: [id number] DOB:01-Dec-1955, 67 y.o., female Today's Date: 08/31/2023  END OF SESSION:  PT End of Session - 08/31/23 1125     Visit Number 13    Number of Visits 20    Date for PT Re-Evaluation 09/28/23    Authorization Type PN/recert done on 08/18/23; request additional 2x/wk x 6 weeks, recert 09/27/22    PT Start Time 1120    PT Stop Time 1200    PT Time Calculation (min) 40 min    Activity Tolerance Patient tolerated treatment well    Behavior During Therapy WFL for tasks assessed/performed                   Past Medical History:  Diagnosis Date   Anemia    past hx - off Iron   Heart murmur    History of varicella    Hypothyroidism    synthroid  ? hyperthyroid when young and thin and then  dx hypo dx at clinic at work    Migraines    pre menopausal  hx of vicodin  rescue    Past Surgical History:  Procedure Laterality Date   ABLATION     ? year    btl     CATARACT EXTRACTION Bilateral    12/06/2021, 12/20/2021   COLONOSCOPY     approx 2 years ago   HYSTEROSCOPY  05/08/2011   Procedure: HYSTEROSCOPY WITH HYDROTHERMAL ABLATION;  Surgeon: Levi Aland;  Location: [facility];  Service: Gynecology;  Laterality: N/A;   svd       x 2   WISDOM TOOTH EXTRACTION     Patient Active Problem List   Diagnosis Date Noted   Breast lump 05/19/2018   Greater trochanteric bursitis of right hip 11/14/2016   Hypothyroidism     PCP: Dr. Fabian Sharp, MD  REFERRING PROVIDER: Dr. Denyse Amass, MD  REFERRING DIAG:  [phone] (ICD-10-CM) - Right knee pain, unspecified chronicity  M79.661 (ICD-10-CM) - Right calf pain    THERAPY DIAG:  Pain in right leg  Weakness of right lower extremity  Rationale for Evaluation and Treatment: Rehabilitation  ONSET DATE: Labor day weekend 2024  SUBJECTIVE: (From initial evaluation note 06/29/23)  SUBJECTIVE  STATEMENT: Pt states she was showing dogs in [place] in August 2024; she travels in a big [place] with her dogs- has a step stool to get in and out of the [place].  She started noticing her R leg was hurting on that trip.  By 5 days into the trip her leg pain progressively worsened and she was having difficulty walking.  Feels "phantom" pain.  She did not have a MOI.  Onset was gradual.  Overall sx are improving.      She has not had this type of sensation/pain before.  No back pain with the sx.  No L leg sx.  Not tingling, but sx shift around   Location: anterior/lateral R hip, sometimes posterior R thigh/knee/calf pain- posterior knee initially was the worst- getting better  She saw her PCP for a consult when she got back and also sports medicine MD the same day.  They did an ultrasound, x-rays and everything was (-).    She got a few exercises: heel drops, soleus stretch, gastroc stretch.    Agg: going up stairs is hard if her leg is tired; walking/jogging  Allev: topical lidocaine patches- helps; has not used them in  a few days.  She has not tried NSAIDs.  Being upright feels better.    Has tried 1 chiropractor tx- no change.    She does not notice that weight bearing specifically influences her sx- maybe more in the lower leg.  Feels good lying.   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  Has to be able to work with dogs that are up to 70 lbs.  Has to be able to lift into the upper crate in the [place]. Has to run counterclockwise.    Enjoys being active in her yard, property   PERTINENT HISTORY: She shows dogs as a professional hobby She goes to the gym 3x/week- does machines, not doing typical workouts currently bc of sx  PAIN:  Are you having pain? Yes; current 0/10; worst 10/10 (early September- Labor Day); in  the past week 2-3/10 at worst   PRECAUTIONS: None  RED FLAGS: None  WEIGHT BEARING RESTRICTIONS: No  FALLS:  Has patient fallen in last 6 months? 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  DIAGNOSTIC FINDINGS: had x-ray and [place] of knee, both (-)  PATIENT SURVEYS:  FOTO 72/81  COGNITION: Overall cognitive status: Within functional limits for tasks assessed      POSTURE: No Significant postural limitations  Pt able to like supine.  (-) R FADDIR today  PALPATION:  (+)TTP R greater trochanter, (-) TTP R knee soft tissue/mm  LOWER EXTREMITY ROM:  Active ROM Right eval Left eval  Hip flexion normal normal  Hip extension    Hip abduction    Hip adduction    Hip internal rotation 15 deg, 35 deg, Pt notes discomfort with OP normal  Hip external rotation Pt notes discomfort with OP normal  Knee flexion Normal and pain free OP  normal  Knee extension Normal and pain free OP normal  Ankle dorsiflexion    Ankle plantarflexion    Ankle inversion    Ankle eversion     (Blank rows = not tested) Further motion testing of hip will be performed at future visit LOWER EXTREMITY MMT:  MMT (out of 5) Right eval Left eval  Hip flexion    Hip extension    Hip abduction    Hip adduction    Hip internal rotation 4/5Pt notes discomfort 5  Hip external rotation 4/5 Pt notes discomfort 5  Knee flexion 5 5  Knee extension 5 5  Ankle dorsiflexion    Ankle  plantarflexion    Ankle inversion    Ankle eversion     (Blank rows = not tested)  LOWER EXTREMITY SPECIAL TESTS:  Full lower quarter neuro screen deferred to next visit due to time constraints as pt arrived late to apt today (-) lumbar extension/rotation test R and L for reproduction  of sx  FUNCTIONAL TESTS:  (+) R LE sx pain with squat, (+) impaired R SLS motor control compared to R, (+) R lateral hip pain with jogging counter clockwise how she has to show dogs  LUMBAR AROM:  (-) reproduction of pain with lumbar spine AROM flexion, extension, side bend, rotation and (-) with OP.  Further PAM deferred to visit #2 due to time constraints.   TODAY'S TREATMENT:                                                                                                                              DATE: 08/31/23  Subjective: Pt reports no increase in sx after new strengthening exercises last session; did not make it to the gym since last PT session.       Pain 2/10 R hip Astrick sign: pushing off R LE going up steps reproduces her typical R lateral lower leg pain  Objective: R hip abd 4/5 R hip ext 4/5 MMT  Manual Therapy: 10 min (+) limited/painful anterior R hip ER (FABER type position) R hip inferior glide: Gr IV @ 90 deg hip flex R hip anterior glide: prone figure 4 and prone with hip in slight extension Gr IV Improved R hip ER PROM noted after Prone hip IR/ER x10 PROM and contract/relax in various ROM with PT R iliopsoas/rectus femoris stretch with PT- not today  Therapeutic Exercise: 30 minutes Nustep 8 mins at level 4-7, goal >70 SPM BLE for hip mm interval training 30 seconds to 1 min intervals at level 6-7 then reduce to level 4-5 x 30 seconds to 1 min Step ups: RLLR 6 inch x15 Step ups on bosu with R LE to SLS: 2x10 Lateral step up and over on bosu R an dL V78 Yoga warrior I/high lunge (L LE front, R back)/addition of lateral trunk flex to L- practiced variations, discussed benefit  for R hip mobility- reviewed Split squats R/L in front: 2x15 ea S/l stance to forward hinge ("warrior 3" reaching for elevated yoga block) standing on R LE Lateral lunges: 2x15 R and L (impaired CKC R LE motor control compared to L)- not today Total gym: b/l 2x15, then x10 R LE focusing on motor control- not today Squats to chair with 5 second holds 2x12 8# medicine ball   Assessed pt jogging in CCW direction around gym after- notes no increase on R LE pain  Not today: Prone on elbow hold x 60 secs. Quads/Iliopsoas stretch 2 x 60 secs hold Figure 4 stretch in quadruped to Prone x 60 secs for hip jt stretch SLR 2 x 10 reps Side lying Hip abd 2 x 10 reps Hip ext in prone 2 x 10 reps STS 2 x 10 reps Step tapping x 10 reps Step up on RLE 20 reps Ascend and descend steps x 60 secs Quad Lumborum strengthening with #3 standing on the step 2 x 10 reps Prone Calf stretch 2 x 60  secs  Seated Hamstring stretch 2 x 60 secs Prone Quad stretch 1 x 60 secs and 2 x 30 secs  Side lying Quad Lumb/ITB stretch x 60 secs Forward flexed ITBand stretch in standing 2 x 60 secs STS with BTB 2 x 60 secs Heel raises in 3 foot position 1 x 60 secs each.  Same for HEP twice a day followed by same strengthening exs from prior sessions.    Not today:  STM R gastroc (lateral head) and peroneal mm Tibiofibular mob: proximal A/P and PA glides Gr III/IV MRE 4 way ankle x10 ea (none painful) Pt reports decreased pain after manual therapy with R LE push off going up step Ascend/descend stairs 4x Standing heel raises b/l: 2x15, x10 toes in, x20 toes out R SLS to warrior III to R SLS: for lower leg mm and deep hip mm dynamic stabilization: 5 rounds to fatigue; repeat on L 3 rounds to fatigue Lateral monster walk: blue TB around distal thigh: 4 laps in gym then keep band with single leg mini squat L with R toe taps into hip abd- 3 rounds to fatigue; repeat other side 3 rounds to fatigue Bridge 2 feet to just R foot  with 3-5 sec holds x 10- reviewed for HEP R Standing SLS: cues for foot tripod, upright posture- to promote activation of gluteal mm + ankle/intrinsic foot mm; 5 trials- reviewed for HEP Eccentric R heel raises: x15- not today S/L R hip abd x10 with 5 sec holds up/down 2 inches- reviewed for HEP Side plank: 10 second holds R side activation x 5- reviewed for HEP Pt ed for ways to space out her HEP during the day; discussed local recommendations/options for yoga classes to inquire about for when she finishes PT as this is something she has been hoping to return to   PATIENT EDUCATION:  Education details: PT POC/goals/HEP/proper technique throughout session Person educated: Patient Education method: Explanation Education comprehension: verbalized understanding  HOME EXERCISE PROGRAM:  Access Code: [email or web address] URL: [email or web address] Date: 08/12/2023 Prepared by: Janet Berlin  Exercises - Hip External Rotation Stretch  - 1 x daily - 7 x weekly - 3 sets - 10 reps - Hip Flexor Stretch at Edge of Bed  - 1 x daily - 7 x weekly - 3 sets - 10 reps  ASSESSMENT:  CLINICAL IMPRESSION: Pt tolerated progression of R LE CKC mm retraining well today with no increase in R hip pain noted at end of session.  She will be traveling the rest of the week, so discussed plan to work on HEP while away.  R SLS CKC strength/proprioception still impaired compared to L; especially noted in warrior 3 movement today.  Overall, making progress with this course of PT.Remaining PT impairments include: R hip pain, decreased LE strength, and R hip hypomobility.  Pt should continue to benefit from skilled PT interventions to address the impairments to promote return to PLOF.  OBJECTIVE IMPAIRMENTS: Abnormal gait, decreased activity tolerance, decreased balance, decreased ROM, decreased strength, impaired perceived functional ability, and pain.   ACTIVITY LIMITATIONS: standing, squatting, stairs, locomotion  level, and jogging CCW  PARTICIPATION LIMITATIONS: community activity, occupation, yard work, and regular exercise at the gym  PERSONAL FACTORS: Time since onset of injury/illness/exacerbation are also affecting patient's functional outcome.   REHAB POTENTIAL: Good  CLINICAL DECISION MAKING: Stable/uncomplicated  EVALUATION COMPLEXITY: Low   GOALS: Goals reviewed with patient? Yes  SHORT TERM GOALS: Target date: 07/24/23 Pt will be able to resume LE strengthening  at her gym without being limited by pain Baseline: only doing Upper body strength work; 11/25- in progress Goal status: INITIAL  LONG TERM GOALS: Target date: 09/27/22  Improve FOTO to >81 indicating pt able to perform her daily activities without being limited by R LE sx Baseline: 72 at initial eval, 11/25- in progress Goal status: INITIAL  2.  Improve R hip strength 1/2 MMT grade to promote improved ability to jog CCW while showing dogs with <3/10 R LE pain Baseline: 11/25 in progress Goal status: INITIAL  3.  Pt will be able to assist 70 lb dogs into her van without <3/10 R LE pain Baseline: needs assistance;  Goal status:MET    PLAN:  PT FREQUENCY: 1-2x/week  PT DURATION: 6 weeks  PLANNED INTERVENTIONS: Patient/Family education, Balance training, Joint mobilization, Therapeutic exercises, Therapeutic activity, Neuromuscular re-education, Gait training, and Self Care  PLAN FOR NEXT SESSION: continue R distal LE strengthening (gluteal mm emphasis) and CKC LE strengthening  Max Fickle, PT, DPT, OCS 11:29 AM,08/31/23

## 2023-09-07 ENCOUNTER — Ambulatory Visit: Payer: Medicare HMO

## 2023-09-07 DIAGNOSIS — R29898 Other symptoms and signs involving the musculoskeletal system: Secondary | ICD-10-CM | POA: Diagnosis not present

## 2023-09-07 DIAGNOSIS — M79604 Pain in right leg: Secondary | ICD-10-CM

## 2023-09-07 NOTE — Therapy (Signed)
OUTPATIENT PHYSICAL THERAPY LOWER EXTREMITY TREATMENT/PROGRESS NOTE/RE-CERTIFICATION THROUGH 09/27/22   Patient Name: Janet Brock MRN: [id number] DOB:Jun 24, 1956, 67 y.o., female Today's Date: 09/07/2023  END OF SESSION:  PT End of Session - 09/07/23 1130     Visit Number 14    Number of Visits 20    Date for PT Re-Evaluation 09/28/23    Authorization Type PN/recert done on 08/18/23; request additional 2x/wk x 6 weeks, recert 09/27/22    PT Start Time 1120    PT Stop Time 1200    PT Time Calculation (min) 40 min    Activity Tolerance Patient tolerated treatment well    Behavior During Therapy WFL for tasks assessed/performed                   Past Medical History:  Diagnosis Date   Anemia    past hx - off Iron   Heart murmur    History of varicella    Hypothyroidism    synthroid  ? hyperthyroid when young and thin and then  dx hypo dx at clinic at work    Migraines    pre menopausal  hx of vicodin  rescue    Past Surgical History:  Procedure Laterality Date   ABLATION     ? year    btl     CATARACT EXTRACTION Bilateral    12/06/2021, 12/20/2021   COLONOSCOPY     approx 2 years ago   HYSTEROSCOPY  05/08/2011   Procedure: HYSTEROSCOPY WITH HYDROTHERMAL ABLATION;  Surgeon: Levi Aland;  Location: [facility];  Service: Gynecology;  Laterality: N/A;   svd       x 2   WISDOM TOOTH EXTRACTION     Patient Active Problem List   Diagnosis Date Noted   Breast lump 05/19/2018   Greater trochanteric bursitis of right hip 11/14/2016   Hypothyroidism     PCP: Dr. Fabian Sharp, MD  REFERRING PROVIDER: Dr. Denyse Amass, MD  REFERRING DIAG:  [phone] (ICD-10-CM) - Right knee pain, unspecified chronicity  M79.661 (ICD-10-CM) - Right calf pain    THERAPY DIAG:  Pain in right leg  Weakness of right lower extremity  Rationale for Evaluation and Treatment: Rehabilitation  ONSET DATE: Labor day weekend 2024  SUBJECTIVE: (From initial evaluation note 06/29/23)  SUBJECTIVE  STATEMENT: Pt states she was showing dogs in [place] in August 2024; she travels in a big [place] with her dogs- has a step stool to get in and out of the [place].  She started noticing her R leg was hurting on that trip.  By 5 days into the trip her leg pain progressively worsened and she was having difficulty walking.  Feels "phantom" pain.  She did not have a MOI.  Onset was gradual.  Overall sx are improving.      She has not had this type of sensation/pain before.  No back pain with the sx.  No L leg sx.  Not tingling, but sx shift around   Location: anterior/lateral R hip, sometimes posterior R thigh/knee/calf pain- posterior knee initially was the worst- getting better  She saw her PCP for a consult when she got back and also sports medicine MD the same day.  They did an ultrasound, x-rays and everything was (-).    She got a few exercises: heel drops, soleus stretch, gastroc stretch.    Agg: going up stairs is hard if her leg is tired; walking/jogging  Allev: topical lidocaine patches- helps; has not used them in  a few days.  She has not tried NSAIDs.  Being upright feels better.    Has tried 1 chiropractor tx- no change.    She does not notice that weight bearing specifically influences her sx- maybe more in the lower leg.  Feels good lying.   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  Has to be able to work with dogs that are up to 70 lbs.  Has to be able to lift into the upper crate in the [place]. Has to run counterclockwise.    Enjoys being active in her yard, property   PERTINENT HISTORY: She shows dogs as a professional hobby She goes to the gym 3x/week- does machines, not doing typical workouts currently bc of sx  PAIN:  Are you having pain? Yes; current 0/10; worst 10/10 (early September- Labor Day); in  the past week 2-3/10 at worst   PRECAUTIONS: None  RED FLAGS: None  WEIGHT BEARING RESTRICTIONS: No  FALLS:  Has patient fallen in last 6 months? 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  DIAGNOSTIC FINDINGS: had x-ray and [place] of knee, both (-)  PATIENT SURVEYS:  FOTO 72/81  COGNITION: Overall cognitive status: Within functional limits for tasks assessed      POSTURE: No Significant postural limitations  Pt able to like supine.  (-) R FADDIR today  PALPATION:  (+)TTP R greater trochanter, (-) TTP R knee soft tissue/mm  LOWER EXTREMITY ROM:  Active ROM Right eval Left eval  Hip flexion normal normal  Hip extension    Hip abduction    Hip adduction    Hip internal rotation 15 deg, 35 deg, Pt notes discomfort with OP normal  Hip external rotation Pt notes discomfort with OP normal  Knee flexion Normal and pain free OP  normal  Knee extension Normal and pain free OP normal  Ankle dorsiflexion    Ankle plantarflexion    Ankle inversion    Ankle eversion     (Blank rows = not tested) Further motion testing of hip will be performed at future visit LOWER EXTREMITY MMT:  MMT (out of 5) Right eval Left eval  Hip flexion    Hip extension    Hip abduction    Hip adduction    Hip internal rotation 4/5Pt notes discomfort 5  Hip external rotation 4/5 Pt notes discomfort 5  Knee flexion 5 5  Knee extension 5 5  Ankle dorsiflexion    Ankle  plantarflexion    Ankle inversion    Ankle eversion     (Blank rows = not tested)  LOWER EXTREMITY SPECIAL TESTS:  Full lower quarter neuro screen deferred to next visit due to time constraints as pt arrived late to apt today (-) lumbar extension/rotation test R and L for reproduction  of sx  FUNCTIONAL TESTS:  (+) R LE sx pain with squat, (+) impaired R SLS motor control compared to R, (+) R lateral hip pain with jogging counter clockwise how she has to show dogs  LUMBAR AROM:  (-) reproduction of pain with lumbar spine AROM flexion, extension, side bend, rotation and (-) with OP.  Further PAM deferred to visit #2 due to time constraints.   TODAY'S TREATMENT:                                                                                                                              DATE: 09/07/23  Subjective: Pt going up her stairs at home is still painful in R hip, she plans accordingly and minimizes how many trips up and down she does during the day.  Overall, feeling stronger.  She did a dog competition/show in [place] and it required a lot of walking and physical work and driving.  She did not notice a flare up of her R hip pain after she returned home this weekend.  Pain 2/10 R hip Astrick sign: pushing off R LE going up steps reproduces her typical R lateral lower leg pain  Objective: R hip abd 4/5 R hip ext 4/5 MMT  Manual Therapy: 10 min (+) limited/painful anterior R hip ER (FABER type position) R hip inferior glide: Gr IV @ 90 deg hip flex R hip anterior glide: prone figure 4 and prone with hip in slight extension Gr IV Improved R hip ER PROM noted after Prone hip IR/ER x10 PROM and contract/relax in various ROM with PT R iliopsoas/rectus femoris stretch with PT- not today  Therapeutic Exercise: 30 minutes Scifit today 8 mins at level 4-7, goal >70 SPM BLE for hip mm interval training 30 seconds to 1 min intervals at level 6-7 then reduce to level 4-5 x 30 seconds to 1  min Step ups: RLLR 6 inch x15 Lateral eccentric step downs 6 inch height R LE 2x15 Step ups on bosu with R LE to SLS: 2x10- not today Lateral step up and over on bosu R an dL W96 Yoga warrior I/high lunge (L LE front, R back)/addition of lateral trunk flex to L- practiced variations, discussed benefit for R hip mobility- reviewed Split squats R/L in front: 2x15 ea with 7# weight, focused on eccentric loewring S/l stance to forward hinge ("warrior 3" reaching for elevated yoga block) standing on R LE x 15 ea LE- focused on eccentric control Lateral lunges: 2x15 R and L (impaired CKC R LE motor control compared to L)- not today Total gym: b/l 2x15, then x10 R LE focusing on motor control Squats to chair with 5 second holds 2x12 8# medicine ball   Assessed pt jogging in CCW direction around gym after- notes no increase on R LE pain    PATIENT EDUCATION:  Education details: PT POC/goals/HEP/proper technique throughout session Person educated: Patient Education method: Explanation Education comprehension: verbalized understanding  HOME EXERCISE PROGRAM:  Access Code: [email or web address] URL: [email or web address] Date: 08/12/2023 Prepared by: Janet Berlin  Exercises - Hip External Rotation Stretch  - 1 x daily - 7 x weekly - 3 sets - 10 reps - Hip Flexor Stretch at Edge of Bed  - 1 x daily - 7 x weekly - 3 sets - 10 reps  ASSESSMENT:  CLINICAL IMPRESSION: Pt tolerated progression of R LE CKC mm retraining well today with no increase in R hip pain noted at end of session.  End range ER at 90 deg hip flexion still recreates her typical sx; end feel is firm.  R SLS CKC strength/proprioception still impaired compared to L; especially noted in warrior 3 movement today.   Overall, making progress with this course of PT. Remaining PT impairments include: R hip pain, decreased LE strength, and R hip hypomobility.  Pt should continue to benefit from skilled PT interventions to address the  impairments to promote return to PLOF.  OBJECTIVE IMPAIRMENTS: Abnormal gait, decreased activity tolerance, decreased balance, decreased ROM, decreased strength, impaired perceived functional ability, and pain.   ACTIVITY LIMITATIONS: standing, squatting, stairs, locomotion level, and jogging CCW  PARTICIPATION LIMITATIONS: community activity, occupation, yard work, and regular exercise at the gym  PERSONAL FACTORS: Time since onset of injury/illness/exacerbation are also affecting patient's functional outcome.   REHAB POTENTIAL: Good  CLINICAL DECISION MAKING: Stable/uncomplicated  EVALUATION COMPLEXITY: Low   GOALS: Goals reviewed with patient? Yes  SHORT TERM GOALS: Target date: 07/24/23 Pt will be able to resume LE strengthening at her gym without being limited by pain Baseline: only doing Upper body strength work; 11/25- in progress Goal status: INITIAL  LONG TERM GOALS: Target date: 09/27/22  Improve FOTO to >81 indicating pt able to perform her daily activities without being limited by R LE sx Baseline: 72 at initial eval, 11/25- in progress Goal status: INITIAL  2.  Improve R hip strength 1/2 MMT grade to promote improved ability to jog CCW while showing dogs with <3/10 R LE pain Baseline: 11/25 in progress Goal status: INITIAL  3.  Pt will be able to assist 70 lb dogs into her van without <3/10 R LE pain Baseline: needs assistance;  Goal status:MET    PLAN:  PT FREQUENCY: 1-2x/week  PT DURATION: 6 weeks  PLANNED INTERVENTIONS: Patient/Family education, Balance training, Joint mobilization, Therapeutic exercises, Therapeutic activity, Neuromuscular re-education, Gait training, and Self Care  PLAN FOR NEXT SESSION: continue R distal LE strengthening (gluteal mm emphasis) and CKC LE strengthening  Max Fickle, PT, DPT, OCS 11:31 AM,09/07/23

## 2023-09-09 ENCOUNTER — Ambulatory Visit: Payer: Medicare HMO

## 2023-09-09 DIAGNOSIS — M79604 Pain in right leg: Secondary | ICD-10-CM | POA: Diagnosis not present

## 2023-09-09 DIAGNOSIS — R29898 Other symptoms and signs involving the musculoskeletal system: Secondary | ICD-10-CM

## 2023-09-09 NOTE — Therapy (Signed)
OUTPATIENT PHYSICAL THERAPY LOWER EXTREMITY TREATMENT/PROGRESS NOTE/RE-CERTIFICATION THROUGH 09/27/22   Patient Name: Janet Brock MRN: [id number] DOB:11/18/1955, 67 y.o., female Today's Date: 09/09/2023  END OF SESSION:  PT End of Session - 09/09/23 1130     Visit Number 15    Number of Visits 20    Date for PT Re-Evaluation 09/28/23    Authorization Type PN/recert done on 08/18/23; request additional 2x/wk x 6 weeks, recert 09/27/22    PT Start Time 1120    PT Stop Time 1205    PT Time Calculation (min) 45 min    Activity Tolerance Patient tolerated treatment well    Behavior During Therapy WFL for tasks assessed/performed                   Past Medical History:  Diagnosis Date   Anemia    past hx - off Iron   Heart murmur    History of varicella    Hypothyroidism    synthroid  ? hyperthyroid when young and thin and then  dx hypo dx at clinic at work    Migraines    pre menopausal  hx of vicodin  rescue    Past Surgical History:  Procedure Laterality Date   ABLATION     ? year    btl     CATARACT EXTRACTION Bilateral    12/06/2021, 12/20/2021   COLONOSCOPY     approx 2 years ago   HYSTEROSCOPY  05/08/2011   Procedure: HYSTEROSCOPY WITH HYDROTHERMAL ABLATION;  Surgeon: Levi Aland;  Location: [facility];  Service: Gynecology;  Laterality: N/A;   svd       x 2   WISDOM TOOTH EXTRACTION     Patient Active Problem List   Diagnosis Date Noted   Breast lump 05/19/2018   Greater trochanteric bursitis of right hip 11/14/2016   Hypothyroidism     PCP: Dr. Fabian Sharp, MD  REFERRING PROVIDER: Dr. Denyse Amass, MD  REFERRING DIAG:  [phone] (ICD-10-CM) - Right knee pain, unspecified chronicity  M79.661 (ICD-10-CM) - Right calf pain    THERAPY DIAG:  Pain in right leg  Weakness of right lower extremity  Rationale for Evaluation and Treatment: Rehabilitation  ONSET DATE: Labor day weekend 2024  SUBJECTIVE: (From initial evaluation note 06/29/23)  SUBJECTIVE  STATEMENT: Pt states she was showing dogs in [place] in August 2024; she travels in a big [place] with her dogs- has a step stool to get in and out of the [place].  She started noticing her R leg was hurting on that trip.  By 5 days into the trip her leg pain progressively worsened and she was having difficulty walking.  Feels "phantom" pain.  She did not have a MOI.  Onset was gradual.  Overall sx are improving.      She has not had this type of sensation/pain before.  No back pain with the sx.  No L leg sx.  Not tingling, but sx shift around   Location: anterior/lateral R hip, sometimes posterior R thigh/knee/calf pain- posterior knee initially was the worst- getting better  She saw her PCP for a consult when she got back and also sports medicine MD the same day.  They did an ultrasound, x-rays and everything was (-).    She got a few exercises: heel drops, soleus stretch, gastroc stretch.    Agg: going up stairs is hard if her leg is tired; walking/jogging  Allev: topical lidocaine patches- helps; has not used them in  a few days.  She has not tried NSAIDs.  Being upright feels better.    Has tried 1 chiropractor tx- no change.    She does not notice that weight bearing specifically influences her sx- maybe more in the lower leg.  Feels good lying.   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  Has to be able to work with dogs that are up to 70 lbs.  Has to be able to lift into the upper crate in the [place]. Has to run counterclockwise.    Enjoys being active in her yard, property   PERTINENT HISTORY: She shows dogs as a professional hobby She goes to the gym 3x/week- does machines, not doing typical workouts currently bc of sx  PAIN:  Are you having pain? Yes; current 0/10; worst 10/10 (early September- Labor Day); in  the past week 2-3/10 at worst   PRECAUTIONS: None  RED FLAGS: None  WEIGHT BEARING RESTRICTIONS: No  FALLS:  Has patient fallen in last 6 months? 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  DIAGNOSTIC FINDINGS: had x-ray and [place] of knee, both (-)  PATIENT SURVEYS:  FOTO 72/81  COGNITION: Overall cognitive status: Within functional limits for tasks assessed      POSTURE: No Significant postural limitations  Pt able to like supine.  (-) R FADDIR today  PALPATION:  (+)TTP R greater trochanter, (-) TTP R knee soft tissue/mm  LOWER EXTREMITY ROM:  Active ROM Right eval Left eval  Hip flexion normal normal  Hip extension    Hip abduction    Hip adduction    Hip internal rotation 15 deg, 35 deg, Pt notes discomfort with OP normal  Hip external rotation Pt notes discomfort with OP normal  Knee flexion Normal and pain free OP  normal  Knee extension Normal and pain free OP normal  Ankle dorsiflexion    Ankle plantarflexion    Ankle inversion    Ankle eversion     (Blank rows = not tested) Further motion testing of hip will be performed at future visit LOWER EXTREMITY MMT:  MMT (out of 5) Right eval Left eval  Hip flexion    Hip extension    Hip abduction    Hip adduction    Hip internal rotation 4/5Pt notes discomfort 5  Hip external rotation 4/5 Pt notes discomfort 5  Knee flexion 5 5  Knee extension 5 5  Ankle dorsiflexion    Ankle  plantarflexion    Ankle inversion    Ankle eversion     (Blank rows = not tested)  LOWER EXTREMITY SPECIAL TESTS:  Full lower quarter neuro screen deferred to next visit due to time constraints as pt arrived late to apt today (-) lumbar extension/rotation test R and L for reproduction  of sx  FUNCTIONAL TESTS:  (+) R LE sx pain with squat, (+) impaired R SLS motor control compared to R, (+) R lateral hip pain with jogging counter clockwise how she has to show dogs  LUMBAR AROM:  (-) reproduction of pain with lumbar spine AROM flexion, extension, side bend, rotation and (-) with OP.  Further PAM deferred to visit #2 due to time constraints.   TODAY'S TREATMENT:                                                                                                                              DATE: 09/09/23  Subjective: Pt reports working on her HEP; overall no new complaints since last session. Overall, feeling stronger with PT.  Pain 2/10 R hip Astrick sign: pushing off R LE going up steps reproduces her typical R lateral lower leg pain  Objective: R hip abd 4/5 R hip ext 4/5 MMT    Therapeutic Exercise: 40 Nustep x 10 mins at level 4-7, goal >70 SPM BLE for hip mm interval training 30 seconds to 1 min intervals at level 6-7 then reduce to level 4-5 x 30 seconds to 1 min; emphasis on endurance/interval training/LE strengthening Step ups: RLLR 6 inch x15 Squats to chair with 5 second holds 2x15 8# medicine ball SLS R LE: 3x 20 sec SLS  R LE: 3x with 3 lb trunk rotation oscillations on neutral pelvis, focusing on CKC SLS hip mm motor contro/strengthen: 30 second intervals Pallof PresS: blue TB R and L 3x8  Pt education for recommendations after she completes PT; discussed options for attending yoga classes as this is something that interests her; gave contact info/website info/yoga therapist contact information for [employer] in [place], [place].   Not today Lateral eccentric step  downs 6 inch height R LE 2x15- not today Step ups on bosu with R LE to SLS: 2x10- not today Lateral step up and over on bosu R an dL Z61- not today Split squats R/L in front: 2x15 ea with 7# weight, focused on eccentric loewring S/l stance to forward hinge ("warrior 3" reaching for elevated yoga block) standing on R LE x 15 ea LE- focused on eccentric control- pt did for HEP this morning before PT Lateral lunges: 2x15 R and L (impaired CKC R LE motor control compared to L)- not today Total gym: b/l 2x15, then x10 R LE focusing on motor control- not today     Manual Therapy: 10 min- NOT TODAY (+) limited/painful anterior R hip ER (FABER type position) R hip inferior glide: Gr IV @ 90 deg hip flex R hip anterior glide: prone figure 4 and prone with hip in slight extension Gr IV Improved R hip ER PROM noted after Prone hip IR/ER x10 PROM and contract/relax in various ROM with PT R iliopsoas/rectus femoris stretch with PT- not today  PATIENT EDUCATION:  Education details: PT POC/goals/HEP/proper technique throughout session Person educated: Patient Education method:  Explanation Education comprehension: verbalized understanding  HOME EXERCISE PROGRAM:  Access Code: [email or web address] URL: [email or web address] Date: 08/12/2023 Prepared by: Janet Berlin  Exercises - Hip External Rotation Stretch  - 1 x daily - 7 x weekly - 3 sets - 10 reps - Hip Flexor Stretch at Edge of Bed  - 1 x daily - 7 x weekly - 3 sets - 10 reps  ASSESSMENT:  CLINICAL IMPRESSION: Progressed R SLS CKC strengthening exercises and CKC hip mm stability/motor control training to promote optimal hip mm function during her CKC 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  She fatigues faster on R than L.  No increase in R hip pain noted at end of session.  Overall, making progress with this course of PT. Remaining PT impairments include: R hip pain, decreased LE  strength, and R hip hypomobility.  Pt should continue to benefit from skilled PT interventions to address the impairments to promote return to PLOF.  OBJECTIVE IMPAIRMENTS: Abnormal gait, decreased activity tolerance, decreased balance, decreased ROM, decreased strength, impaired perceived functional ability, and pain.   ACTIVITY LIMITATIONS: standing, squatting, stairs, locomotion level, and jogging CCW  PARTICIPATION LIMITATIONS: community activity, occupation, yard work, and regular exercise at the gym  PERSONAL FACTORS: Time since onset of injury/illness/exacerbation are also affecting patient's functional outcome.   REHAB POTENTIAL: Good  CLINICAL DECISION MAKING: Stable/uncomplicated  EVALUATION COMPLEXITY: Low   GOALS: Goals reviewed with patient? Yes  SHORT TERM GOALS: Target date: 07/24/23 Pt will be able to resume LE strengthening at her gym without being limited by pain Baseline: only doing Upper body strength work; 11/25- in progress Goal status: INITIAL  LONG TERM GOALS: Target date: 09/27/22  Improve FOTO to >81 indicating pt able to perform her daily activities without being limited by R LE sx Baseline: 72 at initial eval, 11/25- in progress Goal status: INITIAL  2.  Improve R hip strength 1/2 MMT grade to promote improved ability to jog CCW while showing dogs with <3/10 R LE pain Baseline: 11/25 in progress Goal status: INITIAL  3.  Pt will be able to assist 70 lb dogs into her van without <3/10 R LE pain Baseline: needs assistance;  Goal status:MET    PLAN:  PT FREQUENCY: 1-2x/week  PT DURATION: 6 weeks  PLANNED INTERVENTIONS: Patient/Family education, Balance training, Joint mobilization, Therapeutic exercises, Therapeutic activity, Neuromuscular re-education, Gait training, and Self Care  PLAN FOR NEXT SESSION: continue R distal LE strengthening (gluteal mm emphasis) and CKC LE strengthening  Max Fickle, PT, DPT, OCS 1:21 PM,09/09/23

## 2023-09-14 ENCOUNTER — Ambulatory Visit: Payer: Medicare HMO

## 2023-09-14 DIAGNOSIS — M79604 Pain in right leg: Secondary | ICD-10-CM

## 2023-09-14 DIAGNOSIS — R29898 Other symptoms and signs involving the musculoskeletal system: Secondary | ICD-10-CM

## 2023-09-14 NOTE — Therapy (Signed)
OUTPATIENT PHYSICAL THERAPY LOWER EXTREMITY TREATMENT/PROGRESS NOTE/RE-CERTIFICATION THROUGH 09/27/22   Patient Name: Janet Brock MRN: [id number] DOB:Jan 22, 1956, 67 y.o., female Today's Date: 09/14/2023  END OF SESSION:  PT End of Session - 09/14/23 1610     Visit Number 16    Number of Visits 20    Date for PT Re-Evaluation 09/28/23    Authorization Type PN/recert done on 08/18/23; request additional 2x/wk x 6 weeks, recert 09/27/22    PT Start Time 1615    PT Stop Time 1700    PT Time Calculation (min) 45 min    Activity Tolerance Patient tolerated treatment well    Behavior During Therapy WFL for tasks assessed/performed                   Past Medical History:  Diagnosis Date   Anemia    past hx - off Iron   Heart murmur    History of varicella    Hypothyroidism    synthroid  ? hyperthyroid when young and thin and then  dx hypo dx at clinic at work    Migraines    pre menopausal  hx of vicodin  rescue    Past Surgical History:  Procedure Laterality Date   ABLATION     ? year    btl     CATARACT EXTRACTION Bilateral    12/06/2021, 12/20/2021   COLONOSCOPY     approx 2 years ago   HYSTEROSCOPY  05/08/2011   Procedure: HYSTEROSCOPY WITH HYDROTHERMAL ABLATION;  Surgeon: Levi Aland;  Location: [facility];  Service: Gynecology;  Laterality: N/A;   svd       x 2   WISDOM TOOTH EXTRACTION     Patient Active Problem List   Diagnosis Date Noted   Breast lump 05/19/2018   Greater trochanteric bursitis of right hip 11/14/2016   Hypothyroidism     PCP: Dr. Fabian Sharp, MD  REFERRING PROVIDER: Dr. Denyse Amass, MD  REFERRING DIAG:  [phone] (ICD-10-CM) - Right knee pain, unspecified chronicity  M79.661 (ICD-10-CM) - Right calf pain    THERAPY DIAG:  Pain in right leg  Weakness of right lower extremity  Rationale for Evaluation and Treatment: Rehabilitation  ONSET DATE: Labor day weekend 2024  SUBJECTIVE: (From initial evaluation note 06/29/23)  SUBJECTIVE  STATEMENT: Pt states she was showing dogs in [place] in August 2024; she travels in a big [place] with her dogs- has a step stool to get in and out of the [place].  She started noticing her R leg was hurting on that trip.  By 5 days into the trip her leg pain progressively worsened and she was having difficulty walking.  Feels "phantom" pain.  She did not have a MOI.  Onset was gradual.  Overall sx are improving.      She has not had this type of sensation/pain before.  No back pain with the sx.  No L leg sx.  Not tingling, but sx shift around   Location: anterior/lateral R hip, sometimes posterior R thigh/knee/calf pain- posterior knee initially was the worst- getting better  She saw her PCP for a consult when she got back and also sports medicine MD the same day.  They did an ultrasound, x-rays and everything was (-).    She got a few exercises: heel drops, soleus stretch, gastroc stretch.    Agg: going up stairs is hard if her leg is tired; walking/jogging  Allev: topical lidocaine patches- helps; has not used them in  a few days.  She has not tried NSAIDs.  Being upright feels better.    Has tried 1 chiropractor tx- no change.    She does not notice that weight bearing specifically influences her sx- maybe more in the lower leg.  Feels good lying.   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  Has to be able to work with dogs that are up to 70 lbs.  Has to be able to lift into the upper crate in the [place]. Has to run counterclockwise.    Enjoys being active in her yard, property   PERTINENT HISTORY: She shows dogs as a professional hobby She goes to the gym 3x/week- does machines, not doing typical workouts currently bc of sx  PAIN:  Are you having pain? Yes; current 0/10; worst 10/10 (early September- Labor Day); in  the past week 2-3/10 at worst   PRECAUTIONS: None  RED FLAGS: None  WEIGHT BEARING RESTRICTIONS: No  FALLS:  Has patient fallen in last 6 months? 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  DIAGNOSTIC FINDINGS: had x-ray and [place] of knee, both (-)  PATIENT SURVEYS:  FOTO 72/81  COGNITION: Overall cognitive status: Within functional limits for tasks assessed      POSTURE: No Significant postural limitations  Pt able to like supine.  (-) R FADDIR today  PALPATION:  (+)TTP R greater trochanter, (-) TTP R knee soft tissue/mm  LOWER EXTREMITY ROM:  Active ROM Right eval Left eval  Hip flexion normal normal  Hip extension    Hip abduction    Hip adduction    Hip internal rotation 15 deg, 35 deg, Pt notes discomfort with OP normal  Hip external rotation Pt notes discomfort with OP normal  Knee flexion Normal and pain free OP  normal  Knee extension Normal and pain free OP normal  Ankle dorsiflexion    Ankle plantarflexion    Ankle inversion    Ankle eversion     (Blank rows = not tested) Further motion testing of hip will be performed at future visit LOWER EXTREMITY MMT:  MMT (out of 5) Right eval Left eval  Hip flexion    Hip extension    Hip abduction    Hip adduction    Hip internal rotation 4/5Pt notes discomfort 5  Hip external rotation 4/5 Pt notes discomfort 5  Knee flexion 5 5  Knee extension 5 5  Ankle dorsiflexion    Ankle  plantarflexion    Ankle inversion    Ankle eversion     (Blank rows = not tested)  LOWER EXTREMITY SPECIAL TESTS:  Full lower quarter neuro screen deferred to next visit due to time constraints as pt arrived late to apt today (-) lumbar extension/rotation test R and L for reproduction  of sx  FUNCTIONAL TESTS:  (+) R LE sx pain with squat, (+) impaired R SLS motor control compared to R, (+) R lateral hip pain with jogging counter clockwise how she has to show dogs  LUMBAR AROM:  (-) reproduction of pain with lumbar spine AROM flexion, extension, side bend, rotation and (-) with OP.  Further PAM deferred to visit #2 due to time constraints.   TODAY'S TREATMENT:                                                                                                                              DATE: 09/14/23  Subjective: Pt reports working on her HEP; overall no new complaints since last session. Overall, feeling stronger with PT; she just drove in the car to/from the airport and her R hip feels sore getting out of the car  Pain 4/10 R hip upon arrival  Astrick sign: pushing off R LE going up steps reproduces her typical R lateral lower leg pain  Objective: R hip abd 4/5 R hip ext 4/5 MMT  Therapeutic Exercise: 30 min Nustep x 10 mins at level 4-7, goal >70 SPM BLE for hip mm interval training 30 seconds to 1 min intervals at level 6-7 then reduce to level 4-5 x 30 seconds to 1 min; emphasis on endurance/interval training/LE strengthening Lateral step downs x15 Step ups: RLLR 6 inch x15 Squats to chair with 5 second holds 2x15 8# medicine ball SLS R LE: 3x 20 sec- not today SLS  R LE: 3x with 3 lb trunk rotation oscillations on neutral pelvis, focusing on CKC SLS hip mm motor control/strengthen: 30 second intervals- not today Pallof Press: blue TB R and L 3x8 Total gym: b/l 2x15, then x15 R LE focusing on motor control/strength, x15 on L Lateral band walk/monster walk- blue band at ankles 5  laps (added ot HEP)  Manual Therapy: 10 min Long axis hip distraction in slight flex/ER: 10x R hip inferior glide: Gr IV @ 90 deg hip flex R hip anterior glide: prone figure 4 and prone with hip in slight extension Gr III/IV   Not today Step ups on bosu with R LE to SLS: 2x10- not today Lateral step up and over on bosu R an dL Z61- not today Split squats R/L in front: 2x15 ea with 7# weight, focused on eccentric loewring S/l stance to forward hinge ("warrior 3" reaching for elevated yoga block) standing on R LE x 15 ea LE- focused on eccentric control- pt did for HEP this morning before PT Lateral lunges: 2x15 R and L (impaired CKC R LE motor control compared to L)- not today    PATIENT EDUCATION:  Education details: PT POC/goals/HEP/proper technique throughout session Person educated: Patient Education method: Explanation Education comprehension: verbalized understanding  HOME EXERCISE PROGRAM:  Access Code: [email or web address] URL: [email or web address] Date: 08/12/2023 Prepared by: Janet Berlin  Exercises - Hip External Rotation Stretch  - 1 x daily - 7  x weekly - 3 sets - 10 reps - Hip Flexor Stretch at Edge of Bed  - 1 x daily - 7 x weekly - 3 sets - 10 reps  ASSESSMENT:  CLINICAL IMPRESSION: R LE strength asymmetry noted during SL leg press on total gym today.  She fatigues faster on R than L.  Improved eccentric control through smaller motion on eccentric 6 inch step down on R LE compared to first time performing this exercise. Overall, making progress with this course of PT. Remaining PT impairments include: R hip pain, decreased LE strength, and R hip hypomobility.  Pt should continue to benefit from skilled PT interventions to address the impairments to promote return to PLOF.  OBJECTIVE IMPAIRMENTS: Abnormal gait, decreased activity tolerance, decreased balance, decreased ROM, decreased strength, impaired perceived functional ability, and pain.   ACTIVITY  LIMITATIONS: standing, squatting, stairs, locomotion level, and jogging CCW  PARTICIPATION LIMITATIONS: community activity, occupation, yard work, and regular exercise at the gym  PERSONAL FACTORS: Time since onset of injury/illness/exacerbation are also affecting patient's functional outcome.   REHAB POTENTIAL: Good  CLINICAL DECISION MAKING: Stable/uncomplicated  EVALUATION COMPLEXITY: Low   GOALS: Goals reviewed with patient? Yes  SHORT TERM GOALS: Target date: 07/24/23 Pt will be able to resume LE strengthening at her gym without being limited by pain Baseline: only doing Upper body strength work; 11/25- in progress Goal status: INITIAL  LONG TERM GOALS: Target date: 09/27/22  Improve FOTO to >81 indicating pt able to perform her daily activities without being limited by R LE sx Baseline: 72 at initial eval, 11/25- in progress Goal status: INITIAL  2.  Improve R hip strength 1/2 MMT grade to promote improved ability to jog CCW while showing dogs with <3/10 R LE pain Baseline: 11/25 in progress Goal status: INITIAL  3.  Pt will be able to assist 70 lb dogs into her van without <3/10 R LE pain Baseline: needs assistance;  Goal status:MET    PLAN:  PT FREQUENCY: 1-2x/week  PT DURATION: 6 weeks  PLANNED INTERVENTIONS: Patient/Family education, Balance training, Joint mobilization, Therapeutic exercises, Therapeutic activity, Neuromuscular re-education, Gait training, and Self Care  PLAN FOR NEXT SESSION: continue R distal LE strengthening (gluteal mm emphasis) and CKC LE strengthening  Max Fickle, PT, DPT, OCS 4:10 PM,09/14/23

## 2023-09-18 ENCOUNTER — Ambulatory Visit: Payer: Medicare HMO

## 2023-09-18 DIAGNOSIS — R29898 Other symptoms and signs involving the musculoskeletal system: Secondary | ICD-10-CM | POA: Diagnosis not present

## 2023-09-18 DIAGNOSIS — M79604 Pain in right leg: Secondary | ICD-10-CM

## 2023-09-18 NOTE — Therapy (Signed)
OUTPATIENT PHYSICAL THERAPY LOWER EXTREMITY TREATMENT/PROGRESS NOTE/RE-CERTIFICATION THROUGH 09/27/22   Patient Name: Janet Brock MRN: [id number] DOB:1955-10-21, 67 y.o., female Today's Date: 09/18/2023  END OF SESSION:  PT End of Session - 09/18/23 1114     Visit Number 17    Number of Visits 20    Date for PT Re-Evaluation 09/28/23    Authorization Type PN/recert done on 08/18/23; request additional 2x/wk x 6 weeks, recert 09/27/22    PT Start Time 1110    PT Stop Time 1155    PT Time Calculation (min) 45 min    Activity Tolerance Patient tolerated treatment well    Behavior During Therapy WFL for tasks assessed/performed                   Past Medical History:  Diagnosis Date   Anemia    past hx - off Iron   Heart murmur    History of varicella    Hypothyroidism    synthroid  ? hyperthyroid when young and thin and then  dx hypo dx at clinic at work    Migraines    pre menopausal  hx of vicodin  rescue    Past Surgical History:  Procedure Laterality Date   ABLATION     ? year    btl     CATARACT EXTRACTION Bilateral    12/06/2021, 12/20/2021   COLONOSCOPY     approx 2 years ago   HYSTEROSCOPY  05/08/2011   Procedure: HYSTEROSCOPY WITH HYDROTHERMAL ABLATION;  Surgeon: Levi Aland;  Location: [facility];  Service: Gynecology;  Laterality: N/A;   svd       x 2   WISDOM TOOTH EXTRACTION     Patient Active Problem List   Diagnosis Date Noted   Breast lump 05/19/2018   Greater trochanteric bursitis of right hip 11/14/2016   Hypothyroidism     PCP: Dr. Fabian Sharp, MD  REFERRING PROVIDER: Dr. Denyse Amass, MD  REFERRING DIAG:  [phone] (ICD-10-CM) - Right knee pain, unspecified chronicity  M79.661 (ICD-10-CM) - Right calf pain    THERAPY DIAG:  Pain in right leg  Weakness of right lower extremity  Rationale for Evaluation and Treatment: Rehabilitation  ONSET DATE: Labor day weekend 2024  SUBJECTIVE: (From initial evaluation note 06/29/23)  SUBJECTIVE  STATEMENT: Pt states she was showing dogs in [place] in August 2024; she travels in a big [place] with her dogs- has a step stool to get in and out of the [place].  She started noticing her R leg was hurting on that trip.  By 5 days into the trip her leg pain progressively worsened and she was having difficulty walking.  Feels "phantom" pain.  She did not have a MOI.  Onset was gradual.  Overall sx are improving.      She has not had this type of sensation/pain before.  No back pain with the sx.  No L leg sx.  Not tingling, but sx shift around   Location: anterior/lateral R hip, sometimes posterior R thigh/knee/calf pain- posterior knee initially was the worst- getting better  She saw her PCP for a consult when she got back and also sports medicine MD the same day.  They did an ultrasound, x-rays and everything was (-).    She got a few exercises: heel drops, soleus stretch, gastroc stretch.    Agg: going up stairs is hard if her leg is tired; walking/jogging  Allev: topical lidocaine patches- helps; has not used them in  a few days.  She has not tried NSAIDs.  Being upright feels better.    Has tried 1 chiropractor tx- no change.    She does not notice that weight bearing specifically influences her sx- maybe more in the lower leg.  Feels good lying.   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  Has to be able to work with dogs that are up to 70 lbs.  Has to be able to lift into the upper crate in the [place]. Has to run counterclockwise.    Enjoys being active in her yard, property   PERTINENT HISTORY: She shows dogs as a professional hobby She goes to the gym 3x/week- does machines, not doing typical workouts currently bc of sx  PAIN:  Are you having pain? Yes; current 0/10; worst 10/10 (early September- Labor Day); in  the past week 2-3/10 at worst   PRECAUTIONS: None  RED FLAGS: None  WEIGHT BEARING RESTRICTIONS: No  FALLS:  Has patient fallen in last 6 months? 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  DIAGNOSTIC FINDINGS: had x-ray and [place] of knee, both (-)  PATIENT SURVEYS:  FOTO 72/81  COGNITION: Overall cognitive status: Within functional limits for tasks assessed      POSTURE: No Significant postural limitations  Pt able to like supine.  (-) R FADDIR today  PALPATION:  (+)TTP R greater trochanter, (-) TTP R knee soft tissue/mm  LOWER EXTREMITY ROM:  Active ROM Right eval Left eval  Hip flexion normal normal  Hip extension    Hip abduction    Hip adduction    Hip internal rotation 15 deg, 35 deg, Pt notes discomfort with OP normal  Hip external rotation Pt notes discomfort with OP normal  Knee flexion Normal and pain free OP  normal  Knee extension Normal and pain free OP normal  Ankle dorsiflexion    Ankle plantarflexion    Ankle inversion    Ankle eversion     (Blank rows = not tested) Further motion testing of hip will be performed at future visit LOWER EXTREMITY MMT:  MMT (out of 5) Right eval Left eval  Hip flexion    Hip extension    Hip abduction    Hip adduction    Hip internal rotation 4/5Pt notes discomfort 5  Hip external rotation 4/5 Pt notes discomfort 5  Knee flexion 5 5  Knee extension 5 5  Ankle dorsiflexion    Ankle  plantarflexion    Ankle inversion    Ankle eversion     (Blank rows = not tested)  LOWER EXTREMITY SPECIAL TESTS:  Full lower quarter neuro screen deferred to next visit due to time constraints as pt arrived late to apt today (-) lumbar extension/rotation test R and L for reproduction  of sx  FUNCTIONAL TESTS:  (+) R LE sx pain with squat, (+) impaired R SLS motor control compared to R, (+) R lateral hip pain with jogging counter clockwise how she has to show dogs  LUMBAR AROM:  (-) reproduction of pain with lumbar spine AROM flexion, extension, side bend, rotation and (-) with OP.  Further PAM deferred to visit #2 due to time constraints.   TODAY'S TREATMENT:                                                                                                                              DATE: 09/18/23  Subjective: Pt reports working on her HEP; R hip is sore still with stepping up onto R LE on stairs- no big change noted since last session.    Pain 4/10 R hip upon arrival  Astrick sign: pushing off R LE going up steps reproduces her typical R lateral lower leg pain  Objective: R hip abd 4/5 R hip ext 4/5 MMT  Therapeutic Exercise: 43 min Nustep x 10 mins at level 4-7, goal >70 SPM BLE for hip mm interval training 30 seconds to 1 min intervals at level 6-7 then reduce to level 4-5 x 30 seconds to 1 min; emphasis on endurance/interval training/LE strengthening Lateral step downs x15 holding 8# L UE Step ups: RLLR 6 inch x15 holding 8# L UE Squats to chair with 5 second holds 2x15 8# medicine ball- not today SLS R LE: 3x 45 sec intervals on bosu Total gym: b/l 2x15, then x15 R LE focusing on motor control/strength, x15 on L Lateral band walk/monster walk- blue band at ankles 5 laps  Quadruped hip extension- 2 sets to fatigue R and LE  Manual Therapy: 10 min- not today Long axis hip distraction in slight flex/ER: 10x R hip inferior glide: Gr IV @ 90 deg hip flex R hip anterior glide:  prone figure 4 and prone with hip in slight extension Gr III/IV   Not today Step ups on bosu with R LE to SLS: 2x10- not today Lateral step up and over on bosu R an dL V78- not today Split squats R/L in front: 2x15 ea with 7# weight, focused on eccentric loewring S/l stance to forward hinge ("warrior 3" reaching for elevated yoga block) standing on R LE x 15 ea LE- focused on eccentric control- pt did for HEP this morning before PT Lateral lunges: 2x15 R and L (impaired CKC R LE motor control compared to L)- not today SLS  R LE: 3x with 3 lb trunk rotation oscillations on neutral pelvis, focusing on CKC SLS hip mm motor control/strengthen: 30 second intervals- not today Pallof Press: blue TB R and L 3x8   PATIENT EDUCATION:  Education details: PT POC/goals/HEP/proper technique throughout session Person educated: Patient Education method: Explanation Education comprehension: verbalized understanding  HOME EXERCISE PROGRAM:  Access Code: [email or web address] URL: [email or web address] Date: 08/12/2023 Prepared by: Janet Berlin  Exercises - Hip  External Rotation Stretch  - 1 x daily - 7 x weekly - 3 sets - 10 reps - Hip Flexor Stretch at Edge of Bed  - 1 x daily - 7 x weekly - 3 sets - 10 reps  ASSESSMENT:  CLINICAL IMPRESSION: Pt was challenged appropriately with LE strengthening today.  Overall, making progress with this course of PT as her R LE strength and CKC motor control is improving.  She does not notice significant change in R hip pain going up stairs with R LE though.  Remaining PT impairments include: R hip pain, decreased LE strength, and R hip hypomobility.  Pt should continue to benefit from skilled PT interventions to address the impairments to promote return to PLOF.  OBJECTIVE IMPAIRMENTS: Abnormal gait, decreased activity tolerance, decreased balance, decreased ROM, decreased strength, impaired perceived functional ability, and pain.   ACTIVITY LIMITATIONS:  standing, squatting, stairs, locomotion level, and jogging CCW  PARTICIPATION LIMITATIONS: community activity, occupation, yard work, and regular exercise at the gym  PERSONAL FACTORS: Time since onset of injury/illness/exacerbation are also affecting patient's functional outcome.   REHAB POTENTIAL: Good  CLINICAL DECISION MAKING: Stable/uncomplicated  EVALUATION COMPLEXITY: Low   GOALS: Goals reviewed with patient? Yes  SHORT TERM GOALS: Target date: 07/24/23 Pt will be able to resume LE strengthening at her gym without being limited by pain Baseline: only doing Upper body strength work; 11/25- in progress Goal status: INITIAL  LONG TERM GOALS: Target date: 09/27/22  Improve FOTO to >81 indicating pt able to perform her daily activities without being limited by R LE sx Baseline: 72 at initial eval, 11/25- in progress Goal status: INITIAL  2.  Improve R hip strength 1/2 MMT grade to promote improved ability to jog CCW while showing dogs with <3/10 R LE pain Baseline: 11/25 in progress Goal status: INITIAL  3.  Pt will be able to assist 70 lb dogs into her van without <3/10 R LE pain Baseline: needs assistance;  Goal status:MET    PLAN:  PT FREQUENCY: 1-2x/week  PT DURATION: 6 weeks  PLANNED INTERVENTIONS: Patient/Family education, Balance training, Joint mobilization, Therapeutic exercises, Therapeutic activity, Neuromuscular re-education, Gait training, and Self Care  PLAN FOR NEXT SESSION: continue R distal LE strengthening (gluteal mm emphasis) and CKC LE strengthening  Max Fickle, PT, DPT, OCS 11:17 AM,09/18/23

## 2023-09-21 ENCOUNTER — Ambulatory Visit: Payer: Medicare HMO

## 2023-09-21 DIAGNOSIS — M79604 Pain in right leg: Secondary | ICD-10-CM

## 2023-09-21 DIAGNOSIS — R29898 Other symptoms and signs involving the musculoskeletal system: Secondary | ICD-10-CM

## 2023-09-21 NOTE — Therapy (Signed)
OUTPATIENT PHYSICAL THERAPY LOWER EXTREMITY TREATMENT/PROGRESS NOTE/RE-CERTIFICATION THROUGH 09/27/22   Patient Name: Janet Brock MRN: [id number] DOB:1955-12-31, 67 y.o., female Today's Date: 09/21/2023  END OF SESSION:  PT End of Session - 09/21/23 1654     Visit Number 18    Number of Visits 20    Date for PT Re-Evaluation 09/28/23    Authorization Type PN/recert done on 08/18/23; request additional 2x/wk x 6 weeks, recert 09/27/22    PT Start Time 1545    PT Stop Time 1635    PT Time Calculation (min) 50 min    Activity Tolerance Patient tolerated treatment well    Behavior During Therapy WFL for tasks assessed/performed                   Past Medical History:  Diagnosis Date   Anemia    past hx - off Iron   Heart murmur    History of varicella    Hypothyroidism    synthroid  ? hyperthyroid when young and thin and then  dx hypo dx at clinic at work    Migraines    pre menopausal  hx of vicodin  rescue    Past Surgical History:  Procedure Laterality Date   ABLATION     ? year    btl     CATARACT EXTRACTION Bilateral    12/06/2021, 12/20/2021   COLONOSCOPY     approx 2 years ago   HYSTEROSCOPY  05/08/2011   Procedure: HYSTEROSCOPY WITH HYDROTHERMAL ABLATION;  Surgeon: Levi Aland;  Location: [facility];  Service: Gynecology;  Laterality: N/A;   svd       x 2   WISDOM TOOTH EXTRACTION     Patient Active Problem List   Diagnosis Date Noted   Breast lump 05/19/2018   Greater trochanteric bursitis of right hip 11/14/2016   Hypothyroidism     PCP: Dr. Fabian Sharp, MD  REFERRING PROVIDER: Dr. Denyse Amass, MD  REFERRING DIAG:  9170825010 (ICD-10-CM) - Right knee pain, unspecified chronicity  M79.661 (ICD-10-CM) - Right calf pain    THERAPY DIAG:  Pain in right leg  Weakness of right lower extremity  Rationale for Evaluation and Treatment: Rehabilitation  ONSET DATE: Labor day weekend 2024  SUBJECTIVE: (From initial evaluation note 06/29/23)  SUBJECTIVE  STATEMENT: Pt states she was showing dogs in [place] in August 2024; she travels in a big [place] with her dogs- has a step stool to get in and out of the [place].  She started noticing her R leg was hurting on that trip.  By 5 days into the trip her leg pain progressively worsened and she was having difficulty walking.  Feels "phantom" pain.  She did not have a MOI.  Onset was gradual.  Overall sx are improving.      She has not had this type of sensation/pain before.  No back pain with the sx.  No L leg sx.  Not tingling, but sx shift around   Location: anterior/lateral R hip, sometimes posterior R thigh/knee/calf pain- posterior knee initially was the worst- getting better  She saw her PCP for a consult when she got back and also sports medicine MD the same day.  They did an ultrasound, x-rays and everything was (-).    She got a few exercises: heel drops, soleus stretch, gastroc stretch.    Agg: going up stairs is hard if her leg is tired; walking/jogging  Allev: topical lidocaine patches- helps; has not used them in  a few days.  She has not tried NSAIDs.  Being upright feels better.    Has tried 1 chiropractor tx- no change.    She does not notice that weight bearing specifically influences her sx- maybe more in the lower leg.  Feels good lying.   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  Has to be able to work with dogs that are up to 70 lbs.  Has to be able to lift into the upper crate in the [place]. Has to run counterclockwise.    Enjoys being active in her yard, property   PERTINENT HISTORY: She shows dogs as a professional hobby She goes to the gym 3x/week- does machines, not doing typical workouts currently bc of sx  PAIN:  Are you having pain? Yes; current 0/10; worst 10/10 (early September- Labor Day); in  the past week 2-3/10 at worst   PRECAUTIONS: None  RED FLAGS: None  WEIGHT BEARING RESTRICTIONS: No  FALLS:  Has patient fallen in last 6 months? 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  DIAGNOSTIC FINDINGS: had x-ray and [place] of knee, both (-)  PATIENT SURVEYS:  FOTO 72/81  COGNITION: Overall cognitive status: Within functional limits for tasks assessed      POSTURE: No Significant postural limitations  Pt able to like supine.  (-) R FADDIR today  PALPATION:  (+)TTP R greater trochanter, (-) TTP R knee soft tissue/mm  LOWER EXTREMITY ROM:  Active ROM Right eval Left eval  Hip flexion normal normal  Hip extension    Hip abduction    Hip adduction    Hip internal rotation 15 deg, 35 deg, Pt notes discomfort with OP normal  Hip external rotation Pt notes discomfort with OP normal  Knee flexion Normal and pain free OP  normal  Knee extension Normal and pain free OP normal  Ankle dorsiflexion    Ankle plantarflexion    Ankle inversion    Ankle eversion     (Blank rows = not tested) Further motion testing of hip will be performed at future visit LOWER EXTREMITY MMT:  MMT (out of 5) Right eval Left eval  Hip flexion    Hip extension    Hip abduction    Hip adduction    Hip internal rotation 4/5Pt notes discomfort 5  Hip external rotation 4/5 Pt notes discomfort 5  Knee flexion 5 5  Knee extension 5 5  Ankle dorsiflexion    Ankle  plantarflexion    Ankle inversion    Ankle eversion     (Blank rows = not tested)  LOWER EXTREMITY SPECIAL TESTS:  Full lower quarter neuro screen deferred to next visit due to time constraints as pt arrived late to apt today (-) lumbar extension/rotation test R and L for reproduction  of sx  FUNCTIONAL TESTS:  (+) R LE sx pain with squat, (+) impaired R SLS motor control compared to R, (+) R lateral hip pain with jogging counter clockwise how she has to show dogs  LUMBAR AROM:  (-) reproduction of pain with lumbar spine AROM flexion, extension, side bend, rotation and (-) with OP.  Further PAM deferred to visit #2 due to time constraints.   TODAY'S TREATMENT:                                                                                                                              DATE: 09/21/23  Subjective: Pt overall reports improvement since starting PT.  She functionally still is having R hip pain with ascending stair and if she moves her hip in certain positions.  She is feeling much stronger since starting PT.  And her lower leg/calf strain has fully resolved.  She reports working on her PT HEP for hip mobility and strength consistently.  Pain 4/10 R hip upon arrival  Astrick sign: pushing off R LE going up steps reproduces her typical R lateral lower leg pain  Objective: R LE Strength:  R hip abd 4+/5 R hip ext 4+/5 R hip ER 5/5 R hip IR 5/5  R hip flexion 5/5  (+) recreation of R hip pain with end range R hip PROM at 90 deg hip flexion, and R hip ER AROM/PROM is equal and limited compared to L  Unable to directly palpate her typical R hip sx, they are "deep", and pt describes as anterior groin sx   Therapeutic Exercise:  Nustep x 1/2 mile for LE strength/endurance @ level 4-5 (she has bike at home for this) Lateral step downs x15 holding 8# L UE- not today Step ups: RLLR 6 inch x15 holding 8# L UE- not today Squats to chair with 5 second holds 2x15 8# medicine ball-  not today SLS R LE on airex: 30 second intervals x 5 Lateral band walk/monster walk- blue band at ankles 5 laps  Quadruped hip extension- 2 sets to fatigue R and LE Lateral lunges x15 ea direction Split squat x 15 ea direction Warrior 3 to yoga block on step: 5x ea side  Reviewed purpose of therapeutic exercises for mobility, strength, and stability  Reviewed how to incorporate mobility exercises into routine daily, and strengthening at home and also discussed gym options with the machines she has access to at least 3x/week   PATIENT EDUCATION:  Education details: PT POC/goals/HEP/proper technique throughout session Person educated: Patient Education method: Explanation Education comprehension: verbalized understanding  HOME EXERCISE PROGRAM: current exercises from today's session  Access Code: [email or web address] URL: [email or web address] Date: 08/12/2023 Prepared by: Janet Berlin  Exercises - Hip External Rotation Stretch  - 1 x daily - 7 x weekly - 3 sets - 10 reps - Hip Flexor Stretch at Edge of Bed  - 1 x daily - 7 x weekly - 3 sets -  10 reps  ASSESSMENT:  CLINICAL IMPRESSION: Pt has made significant progress with this course of physical therapy.  Her R distal LE sx from her gastroc strain have fully resolved.  Her R hip strength has improved.  Functionally she is performing all her daily activities, but continues to report R anterior groin/hip pain with ascending stairs during R LE push up and also if she externally rotates her R hip in positions like FABER type positions.  Her R hip joint mobility is hypomobile and she lacks R hip AROM/PROM compared to L.  She feels confident continuing with her HEP and gym strength/mobility exercises.  Recommended she f/u with Dr. Denyse Amass for further evaluation/imaging of R hip if her R hip pain continues to bother her.  OBJECTIVE IMPAIRMENTS: Abnormal gait, decreased activity tolerance, decreased balance, decreased ROM, decreased strength,  impaired perceived functional ability, and pain.   ACTIVITY LIMITATIONS: standing, squatting, stairs, locomotion level, and jogging CCW  PARTICIPATION LIMITATIONS: community activity, occupation, yard work, and regular exercise at the gym  PERSONAL FACTORS: Time since onset of injury/illness/exacerbation are also affecting patient's functional outcome.   REHAB POTENTIAL: Good  CLINICAL DECISION MAKING: Stable/uncomplicated  EVALUATION COMPLEXITY: Low   GOALS: Goals reviewed with patient? Yes  SHORT TERM GOALS: Target date: 07/24/23 Pt will be able to resume LE strengthening at her gym without being limited by pain Baseline: only doing Upper body strength work; 11/25- in progress; 09/21/23: pt met Goal status: MET  LONG TERM GOALS: Target date: 09/27/22  Improve FOTO to >81 indicating pt able to perform her daily activities without being limited by R LE sx Baseline: 72 at initial eval, 11/25- in progress Goal status: In progress  2.  Improve R hip strength 1/2 MMT grade to promote improved ability to jog CCW while showing dogs with <3/10 R LE pain Baseline: 11/25 in progress; 09/21/23: 4+ to 5/5 Goal status: MET  3.  Pt will be able to assist 70 lb dogs into her [place]ena[place] Niece without <3/10 R LE pain Baseline: needs assistance; 09/21/23- able to perform this without R hip pain Goal status:MET  PLAN:  PT FREQUENCY: 1-2x/week  PT DURATION: 6 weeks  PLANNED INTERVENTIONS: Patient/Family education, Balance training, Joint mobilization, Therapeutic exercises, Therapeutic activity, Neuromuscular re-education, Gait training, and Self Care  PLAN FOR NEXT SESSION: pt plans to continue with HEP independently; planning to f/u with Dr. Denyse Amass after the new year for further evaluation of her R hip  Max Fickle, PT, DPT, OCS 4:56 PM,09/21/23

## 2023-09-28 ENCOUNTER — Telehealth: Payer: Self-pay | Admitting: Internal Medicine

## 2023-09-28 NOTE — Telephone Encounter (Signed)
 Copied from CRM [id number]. Topic: General - Call Back - No Documentation >> Sep 28, 2023 11:38 AM Samuel Jester B wrote: Reason for CRM: Pt is requesting a callback to update and confirm all the vaccines that she has gotten and may need coming up.

## 2023-10-08 ENCOUNTER — Encounter: Payer: Self-pay | Admitting: Family Medicine

## 2023-10-08 ENCOUNTER — Ambulatory Visit (INDEPENDENT_AMBULATORY_CARE_PROVIDER_SITE_OTHER): Payer: Medicare HMO

## 2023-10-08 ENCOUNTER — Other Ambulatory Visit: Payer: Self-pay

## 2023-10-08 ENCOUNTER — Ambulatory Visit: Payer: Medicare HMO | Admitting: Family Medicine

## 2023-10-08 VITALS — BP 122/78 | HR 66 | Ht 65.0 in | Wt 151.0 lb

## 2023-10-08 DIAGNOSIS — G8929 Other chronic pain: Secondary | ICD-10-CM

## 2023-10-08 DIAGNOSIS — M160 Bilateral primary osteoarthritis of hip: Secondary | ICD-10-CM | POA: Diagnosis not present

## 2023-10-08 DIAGNOSIS — M25561 Pain in right knee: Secondary | ICD-10-CM | POA: Diagnosis not present

## 2023-10-08 DIAGNOSIS — M25551 Pain in right hip: Secondary | ICD-10-CM

## 2023-10-08 NOTE — Progress Notes (Signed)
   I, Stevenson Clinch, CMA acting as a scribe for Clementeen Graham, MD.  Janet Brock is a 68 y.o. female who presents to [facility] at [facility] today for f/u R posterior knee and calf pain. Pt was last seen by Dr. Denyse Amass on 07/15/23 and was advised to give PT another month, completing 18 total visits.  Today, pt reports continued pain. PT went really well, they think sx are related to the hip. Compliant with HEP. Does notes some improvement in calf sx. Continues to have knee pain with full extension. Notes difficulty ascending stairs and sit-to-stand. Denies swelling. Notes occasionally popping in the hip, more so when doing certain exercises. Denies lower back pain.   Dx imaging: 06/03/23 R knee XR   Pertinent review of systems: No fevers or chills  Relevant historical information: Hypothyroidism   Exam:  BP 122/78   Pulse 66   Ht 5\' 5"  (1.651 m)   Wt 151 lb (68.5 kg)   LMP 12/21/2013   SpO2 96%   BMI 25.13 kg/m  General: Well Developed, well nourished, and in no acute distress.   MSK: Right hip normal-appearing normal motion pain with flexion and external rotation. Hip abduction and external rotation strength are mildly diminished and painful. Mild antalgic gait.    Lab and Radiology Results  X-ray images right hip obtained today personally and independently interpreted. Minimal degenerative changes.  Pincer type deformity present indicating possible impingement. Await formal radiology review     Assessment and Plan: 68 y.o. female with chronic right anterior hip pain.  This has been ongoing for 2 months.  She has had a great trial of physical therapy which has improved other right leg pains but not the right anterior hip pain.  She has popping and catching and pain with flexion and rotation.  This is concerning for labrum tear.  Plan for MRI arthrogram with possible steroid injection.   PDMP not reviewed this encounter. Orders Placed This Encounter   Procedures   DG HIP UNILAT WITH PELVIS 2-3 VIEWS RIGHT    Standing Status:   Future    Number of Occurrences:   1    Expiration Date:   10/07/2024    Reason for Exam (SYMPTOM  OR DIAGNOSIS REQUIRED):   eval hip pain    Preferred imaging location?:   [facility]   MR HIP RIGHT WO CONTRAST    MRI arthrogram.  No IV contrast.  Schedule with Dr. Karie Schwalbe 1 hour prior to MRI for injection.    Standing Status:   Future    Expiration Date:   10/07/2024    What is the patient's sedation requirement?:   No Sedation    Does the patient have a pacemaker or implanted devices?:   No    Preferred imaging location?:   [facility] (table limit-350lbs)   No orders of the defined types were placed in this encounter.    Discussed warning signs or symptoms. Please see discharge instructions. Patient expresses understanding.   The above documentation has been reviewed and is accurate and complete Clementeen Graham, M.D.

## 2023-10-08 NOTE — Patient Instructions (Addendum)
Thank you for coming in today.  Please get an Xray today before you leave   Anticipate MRI arthrogram.

## 2023-10-16 ENCOUNTER — Encounter: Payer: Self-pay | Admitting: Family Medicine

## 2023-10-16 NOTE — Progress Notes (Signed)
Right hip x-ray shows some mild arthritis of both hips.

## 2023-10-19 ENCOUNTER — Ambulatory Visit (INDEPENDENT_AMBULATORY_CARE_PROVIDER_SITE_OTHER): Payer: Medicare HMO | Admitting: Sports Medicine

## 2023-10-19 ENCOUNTER — Ambulatory Visit (INDEPENDENT_AMBULATORY_CARE_PROVIDER_SITE_OTHER): Payer: Medicare HMO

## 2023-10-19 ENCOUNTER — Ambulatory Visit: Payer: Medicare HMO

## 2023-10-19 DIAGNOSIS — S76011A Strain of muscle, fascia and tendon of right hip, initial encounter: Secondary | ICD-10-CM | POA: Diagnosis not present

## 2023-10-19 DIAGNOSIS — G8929 Other chronic pain: Secondary | ICD-10-CM | POA: Diagnosis not present

## 2023-10-19 DIAGNOSIS — M7601 Gluteal tendinitis, right hip: Secondary | ICD-10-CM | POA: Diagnosis not present

## 2023-10-19 DIAGNOSIS — S73191A Other sprain of right hip, initial encounter: Secondary | ICD-10-CM | POA: Diagnosis not present

## 2023-10-19 DIAGNOSIS — M25451 Effusion, right hip: Secondary | ICD-10-CM | POA: Diagnosis not present

## 2023-10-19 DIAGNOSIS — M7061 Trochanteric bursitis, right hip: Secondary | ICD-10-CM

## 2023-10-19 DIAGNOSIS — M25551 Pain in right hip: Secondary | ICD-10-CM | POA: Diagnosis not present

## 2023-10-19 MED ORDER — GADOBUTROL 1 MMOL/ML IV SOLN
1.0000 mL | Freq: Once | INTRAVENOUS | Status: AC | PRN
  Administered 2023-10-19: 1 mL via INTRAVENOUS

## 2023-10-19 NOTE — Assessment & Plan Note (Signed)
Pain in the groin.   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 Further management per primary treating provider.

## 2023-10-19 NOTE — Progress Notes (Signed)
    Procedures performed today:    Procedure: Real-time Ultrasound Guided gadolinium contrast injection of right hip joint Device: Samsung HS60  Verbal informed consent obtained.  Time-out conducted.  Noted no overlying erythema, induration, or other signs of local infection.  Skin prepped in a sterile fashion.  Local anesthesia: Topical Ethyl chloride.  With sterile technique and under real time ultrasound guidance: 22-gauge spinal needle advanced to the femoral head/neck junction, contacted bone, I then injected 1 cc kenalog 40, 2 cc lidocaine, 2 cc bupivacaine, syringe switched and 0.1 cc gadolinium injected, syringe again switched and 10 cc sterile saline used to fully distend the joint. Joint visualized and capsule seen distending confirming intra-articular placement of contrast material and medication. Completed without difficulty  Advised to call if fevers/chills, erythema, induration, drainage, or persistent bleeding.  Images permanently stored in PACS Impression: Technically successful ultrasound guided gadolinium contrast injection for MR arthrography.  Please see separate MR arthrogram report.  Independent interpretation of notes and tests performed by another provider:   None.  Brief History, Exam, Impression, and Recommendations:    Greater trochanteric bursitis of right hip Pain in the groin.   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 Further management per primary treating provider.    ____________________________________________ Ihor Austin. Benjamin Stain, M.D., ABFM., CAQSM., AME. Primary Care and Sports Medicine [place] [facility]  Adjunct Professor of Family Medicine  [place] [employer]  Restaurant manager, fast food

## 2023-10-27 ENCOUNTER — Encounter: Payer: Self-pay | Admitting: Family Medicine

## 2023-10-27 NOTE — Progress Notes (Signed)
Right hip MRI shows a labrum tear which I believe is causing your anterior hip pain.  Additionally you have a partial tear and tendinitis of the gluteus medius muscle and tendon at the side of the hip.  That can cause pain on the outside part of the hip as well.  He did have a cortisone shot as part of the arthrogram injection.  How are you feeling?

## 2023-11-03 ENCOUNTER — Encounter: Payer: Self-pay | Admitting: Internal Medicine

## 2023-11-03 ENCOUNTER — Ambulatory Visit (INDEPENDENT_AMBULATORY_CARE_PROVIDER_SITE_OTHER): Payer: Medicare HMO | Admitting: Internal Medicine

## 2023-11-03 VITALS — BP 120/78 | HR 61 | Temp 97.9°F | Ht 65.0 in | Wt 150.6 lb

## 2023-11-03 DIAGNOSIS — S8991XA Unspecified injury of right lower leg, initial encounter: Secondary | ICD-10-CM

## 2023-11-03 DIAGNOSIS — Z7184 Encounter for health counseling related to travel: Secondary | ICD-10-CM | POA: Diagnosis not present

## 2023-11-03 DIAGNOSIS — Z23 Encounter for immunization: Secondary | ICD-10-CM | POA: Diagnosis not present

## 2023-11-03 NOTE — Progress Notes (Signed)
Chief Complaint  Patient presents with   Knee Injury    Pt reports she was training dog for bird hunting and got wrap up with lease string aroung her legs. Then got drag by two dog.   . Back, head, neck is fine .  Pt states states her R knee is swollen   Immunizations    Pt reports she will be traveling to [place] and would like to discuss about getting vaccine.     HPI: Janet Brock 68 y.o. come in for  npre travel immunization disc  but  in interim 4 days ago had an injury to r knee   Seen dr Denyse Amass  regarding r hip  labral tear   doing PT  arthrogram and steroid injection.   Had PT doing better  . And non prn basis.   Now R knee injury from line around from dog   can walk but pain to knee .  Once.  . And   hurts to pivot also .    No instable feeling   no poping but pain  from benindg  90d and past . Has some bruising medially.  To go to [place] for  2 days and then [place] and back to [place] .   Has had 2 covid shots and no definitive  infection but sx of such .  Not sure if had hep b vaccine but was in [employer] family and got a lot of shots when [age] to update.   ROS: See pertinent positives and negatives per HPI.  Past Medical History:  Diagnosis Date   Anemia    past hx - off Iron   Heart murmur    History of varicella    Hypothyroidism    synthroid  ? hyperthyroid when young and thin and then  dx hypo dx at clinic at work    Migraines    pre menopausal  hx of vicodin  rescue     Family History  Problem Relation Age of Onset   Leukemia Mother    Cancer Mother 74       Breast Cancer   Breast cancer Mother    Alcohol abuse Father    Heart disease Father    Dementia Father    Lymphoma Sister    Thyroid disease Sister    Cancer Maternal Aunt        Breast, Skin   Leukemia Maternal Aunt    Breast cancer Maternal Aunt    Colon cancer Neg Hx    Colon polyps Neg Hx    Esophageal cancer Neg Hx    Rectal cancer Neg Hx    Stomach cancer Neg Hx     Social History    Socioeconomic History   Marital status: Single    Spouse name: Not on file   Number of children: Not on file   Years of education: Not on file   Highest education level: Some college, no degree  Occupational History   Not on file  Tobacco Use   Smoking status: Never   Smokeless tobacco: Never  Vaping Use   Vaping status: Never Used  Substance and Sexual Activity   Alcohol use: Yes    Alcohol/week: 2.0 standard drinks of alcohol    Types: 2 Glasses of wine per week   Drug use: No   Sexual activity: Yes    Partners: Male    Birth control/protection: Surgical  Other Topics Concern   Not on file  Social History Narrative   Receives 7 hours of sleep per night   Lives at home with her partner and sometimes her youngest son     Has 10 dogs (has show dogs), 1 cat and 5 goats   Works full Research scientist (medical)    NP on site    [phone] - 5  Work  Desk work.    From [place] has lived in [place] and [place].   Mom lives in [place] father lives in [place].   g3 p3   Father passed 2016  alzhiemer prostate cancer age 12   Mom 17 generally well   Social Drivers of Corporate investment banker Strain: Low Risk  (10/15/2023)   Overall Financial Resource Strain (CARDIA)    Difficulty of Paying Living Expenses: Not hard at all  Food Insecurity: No Food Insecurity (10/15/2023)   Hunger Vital Sign    Worried About Running Out of Food in the Last Year: Never true    Ran Out of Food in the Last Year: Never true  Transportation Needs: No Transportation Needs (10/15/2023)   PRAPARE - Administrator, Civil Service (Medical): No    Lack of Transportation (Non-Medical): No  Physical Activity: Insufficiently Active (10/15/2023)   Exercise Vital Sign    Days of Exercise per Week: 2 days    Minutes of Exercise per Session: 10 min  Stress: No Stress Concern Present (10/15/2023)   [employer] of Occupational Health - Occupational Stress Questionnaire    Feeling  of Stress : Not at all  Social Connections: Moderately Isolated (10/15/2023)   Social Connection and Isolation Panel [NHANES]    Frequency of Communication with Friends and Family: Three times a week    Frequency of Social Gatherings with Friends and Family: Twice a week    Attends Religious Services: Never    Database administrator or Organizations: No    Attends Engineer, structural: Not on file    Marital Status: Living with partner    Outpatient Medications Prior to Visit  Medication Sig Dispense Refill   acetaminophen (TYLENOL) 325 MG tablet Take 650 mg by mouth every 6 (six) hours as needed.     ASPIRIN 81 PO Take 1 tablet by mouth daily. Three x a week     levothyroxine (SYNTHROID) 100 MCG tablet TAKE 1 TABLET BY MOUTH EVERY DAY 90 tablet 2   No facility-administered medications prior to visit.     EXAM:  BP 120/78 (BP Location: Right Arm, Patient Position: Sitting, Cuff Size: Normal)   Pulse 61   Temp 97.9 F (36.6 C) (Oral)   Ht 5\' 5"  (1.651 m)   Wt 150 lb 9.6 oz (68.3 kg)   LMP 12/21/2013   SpO2 98%   BMI 25.06 kg/m   Body mass index is 25.06 kg/m.  GENERAL: vitals reviewed and listed above, alert, oriented, appears well hydrated and in no acute distress HEENT: atraumatic, conjunctiva  clear, no obvious abnormalities on inspection of external nose and ears  NECK: no obvious masses on inspection palpation  LUNGS: clear to auscultation bilaterally, no wheezes, rales or rhonchi, good air movement CV: HRRR, no clubbing cyanosis or  peripheral edema nl cap refill  MS: moves all extremities right knee with tenderness anterior  and streak of bruise medial thigh  min swelling  rom 90 degrees ok  drawer seems =  no point tender  gait minimally antalgic  PSYCH: pleasant and cooperative, no obvious  depression or anxiety  BP Readings from Last 3 Encounters:  11/03/23 120/78  10/08/23 122/78  07/15/23 110/76    ASSESSMENT AND PLAN:  Discussed the following  assessment and plan:  Knee injury, right, initial encounter - Plan: Ambulatory referral to Sports Medicine  Influenza vaccine needed - Plan: Flu Vaccine Trivalent High Dose (Fluad)  Counseling about travel Reviewed immuniz   and disc  update and medical not covere some in office  Reasonable to get covid booster pre travel to prevent trip interruption and consider tdap   Flu vaccine today   Knee injury needs fu   as she was dragged  by dogs with leash around ankle but having knee injury  sx .Marland Kitchen To travel soon .  Opinion from sm  appreciated and advised .  -Patient advised to return or notify health care team  if  new concerns arise.  Patient Instructions  Ice   for knee injury .   Since  going to travel  may want to go back to sports medicine to check before travel to optimize recovery f or  your trip.   Last tdap  was  sept 2015    covid booster. Reasonable  to get before travel.    Second shingles  vaccine  any time . Not necessay for travel.   Medicare not covered in office so get the above  at pharmacy or HD .           Neta Mends. Jorian Willhoite M.D.

## 2023-11-03 NOTE — Patient Instructions (Addendum)
Ice   for knee injury .   Since  going to travel  may want to go back to sports medicine to check before travel to optimize recovery f or  your trip.   Last tdap  was  sept 2015    covid booster. Reasonable  to get before travel.    Second shingles  vaccine  any time . Not necessay for travel.   Medicare not covered in office so get the above  at pharmacy or HD .

## 2023-11-04 ENCOUNTER — Other Ambulatory Visit: Payer: Self-pay

## 2023-11-04 ENCOUNTER — Encounter: Payer: Self-pay | Admitting: Family Medicine

## 2023-11-04 ENCOUNTER — Ambulatory Visit (INDEPENDENT_AMBULATORY_CARE_PROVIDER_SITE_OTHER): Payer: Medicare HMO

## 2023-11-04 ENCOUNTER — Ambulatory Visit: Payer: Medicare HMO | Admitting: Family Medicine

## 2023-11-04 VITALS — BP 108/72 | HR 62 | Ht 65.0 in | Wt 151.0 lb

## 2023-11-04 DIAGNOSIS — M25461 Effusion, right knee: Secondary | ICD-10-CM | POA: Diagnosis not present

## 2023-11-04 DIAGNOSIS — M25561 Pain in right knee: Secondary | ICD-10-CM

## 2023-11-04 NOTE — Patient Instructions (Addendum)
Thank you for coming in today.   Please get an Xray today before you leave   You received an injection today. Seek immediate medical attention if the joint becomes red, extremely painful, or is oozing fluid.   Please use Voltaren gel (Generic Diclofenac Gel) up to 4x daily for pain as needed.  This is available over-the-counter as both the name brand Voltaren gel and the generic diclofenac gel.   Hinged knee brace  Have a WONDERFUL trip!

## 2023-11-04 NOTE — Telephone Encounter (Signed)
Forwarding to Dr. Denyse Amass to review and advise. She would also like recommendations for "ice schedule".

## 2023-11-04 NOTE — Progress Notes (Signed)
Rubin Payor, PhD, LAT, ATC acting as a scribe for Clementeen Graham, MD.  Janet Brock is a 68 y.o. female who presents to [facility] at [facility] today for R knee pain. Pt was previously seen by Dr. Denyse Amass for R hip pain.  Today, pt c/o R knee pain ongoing since Friday. MOI: She was training her dogs for bird hunting and got the line wrapped up around her legs and was dragged by the dogs about 64ft. Pt locates pain to all over the knee.   She has an upcoming trip to [place]  R Knee swelling: yes and ecchymosis Mechanical symptoms: no Aggravates: pivot motions, stairs Treatments tried: ice, IBU  Dx imaging: 06/03/23 R knee XR  Pertinent review of systems: No fever or chills  Relevant historical information: Hypothyroidism.  She is traveling to [place] in early March.   Exam:  BP 108/72   Pulse 62   Ht 5\' 5"  (1.651 m)   Wt 151 lb (68.5 kg)   LMP 12/21/2013   SpO2 99%   BMI 25.13 kg/m  General: Well Developed, well nourished, and in no acute distress.   MSK: Right knee contusion medial knee.  Mild joint effusion is present. Range of motion: Decreased flexion but retained full extension. Tender palpation medial joint line. Mild laxity with MCL stress test with pain.  No severe laxity. Anterior drawer test was positive. Positive McMurray's test medially. Intact strength.   Lab and Radiology Results  Procedure: Real-time Ultrasound Guided aspiration and injection of right knee superior lateral approach Device: Philips Affiniti 50G/GE Logiq Images permanently stored and available for review in PACS Verbal informed consent obtained.  Discussed risks and benefits of procedure. Warned about infection, bleeding, hyperglycemia damage to structures among others. Patient expresses understanding and agreement Time-out conducted.   Noted no overlying erythema, induration, or other signs of local infection.   Skin prepped in a sterile fashion.   Local  anesthesia: Topical Ethyl chloride.   With sterile technique and under real time ultrasound guidance: 2 mL of lidocaine injected into continuous tissue achieving good anesthesia.. Skin again sterilized with isopropyl alcohol and an 18-gauge needle was used to access the knee joint. 10 mL of serosanguineous fluid aspirated. Syringe was exchanged and 40 mg of Kenalog and 2 mg of Marcaine were injected in the knee joint.  Fluid seen entering the joint capsule.   Completed without difficulty   Pain immediately resolved suggesting accurate placement of the medication.   Advised to call if fevers/chills, erythema, induration, drainage, or persistent bleeding.   Images permanently stored and available for review in the ultrasound unit.  Impression: Technically successful ultrasound guided injection.   X-ray images right knee obtained today personally and independently interpreted. No severe DJD.  No acute fractures are visible. Await formal radiology review   Assessment and Plan: 68 y.o. female with right knee pain after being dragged by her leg by a dog on a leash. She does have a little bit of laxity and pain with MCL stress test indicating a grade 1 MCL strain.  It is hard to tell if she has true ACL laxity with anterior drawer test.  It is possible but less likely especially given the mechanism of injury for her have an ACL tear.  I think it is also quite likely that she does have a medial meniscus injury.  We talked about options.  Plan for aspiration and injection today and hinged knee brace.  If she is  not improving prior to her trip to [place] we could obtain an MRI if needed.   PDMP not reviewed this encounter. Orders Placed This Encounter  Procedures   [place] LIMITED JOINT SPACE STRUCTURES LOW RIGHT(NO LINKED CHARGES)    Reason for Exam (SYMPTOM  OR DIAGNOSIS REQUIRED):   right knee pain    Preferred imaging location?:   [facility] Knee AP/LAT W/[place] Right     Standing Status:   Future    Number of Occurrences:   1    Expiration Date:   12/02/2023    Reason for Exam (SYMPTOM  OR DIAGNOSIS REQUIRED):   right knee pain    Preferred imaging location?:   [place] [facility]   No orders of the defined types were placed in this encounter.    Discussed warning signs or symptoms. Please see discharge instructions. Patient expresses understanding.   The above documentation has been reviewed and is accurate and complete Clementeen Graham, M.D.

## 2023-11-13 ENCOUNTER — Telehealth: Payer: Self-pay

## 2023-11-13 NOTE — Telephone Encounter (Addendum)
Called the telephone number to [facility] to report that a START x ray report was called into the wrong office and that he needs to go the [facility] at [facility] to the attention Dr. Clementeen Graham

## 2023-11-16 ENCOUNTER — Ambulatory Visit: Payer: Medicare HMO

## 2023-11-16 ENCOUNTER — Encounter: Payer: Self-pay | Admitting: Family Medicine

## 2023-11-16 DIAGNOSIS — M25561 Pain in right knee: Secondary | ICD-10-CM

## 2023-11-16 DIAGNOSIS — S83411A Sprain of medial collateral ligament of right knee, initial encounter: Secondary | ICD-10-CM | POA: Diagnosis not present

## 2023-11-16 DIAGNOSIS — R609 Edema, unspecified: Secondary | ICD-10-CM | POA: Diagnosis not present

## 2023-11-16 DIAGNOSIS — M25461 Effusion, right knee: Secondary | ICD-10-CM | POA: Diagnosis not present

## 2023-11-16 NOTE — Progress Notes (Signed)
 Right knee x-ray shows fluid in the knee joint that the radiologist was worried could represent a fracture.  The fluid that I pulled out of your knee was not particularly bloody therefore am not sure about that.  They would like a CT scan or an MRI.  I am happy to get an MRI now if you are still hurting.

## 2023-11-16 NOTE — Telephone Encounter (Signed)
 I called. SHe is still hurting. Plan MRI this week if able.

## 2023-11-17 ENCOUNTER — Encounter: Payer: Self-pay | Admitting: Family Medicine

## 2023-11-17 NOTE — Progress Notes (Signed)
 Knee MRI shows a very likely complete tear of the posterior cruciate ligament and a partial tear of the medial collateral ligament.  You do have bone bruising which is where the pain is coming from.  Recommend return to clinic with me right before you leave for [place].  You should be able to schedule Wednesday or Thursday. A hinged knee brace could help if you are not already using one.

## 2023-11-17 NOTE — Progress Notes (Unsigned)
 Rubin Payor, PhD, LAT, ATC acting as a scribe for Clementeen Graham, MD.  Janet Brock is a 68 y.o. female who presents to [facility] at [facility] today for f/u R knee pain w/ MRI review. Pt was last seen by Dr. Denyse Amass on 11/04/23 and her R knee was aspirated and injected and was advised to wear a hinged knee brace. MyChart messages were exchanged about her XR results and a MRI was ordered.  Today, pt reports R knee was hurting yesterday along the medial aspect. Being immobile is nagging her. She reports extensive bruising over the knee and into the lower leg.   Dx imaging: 11/16/23 R knee MRI 06/03/23 R knee XR   Pertinent review of systems: no fever or chills  Relevant historical information: Hypothyroidism.  Patient is traveling to [place] this weekend.   Exam:  BP 132/76   Pulse 85   Ht 5\' 5"  (1.651 m)   Wt 150 lb (68 kg)   LMP 12/21/2013   SpO2 97%   BMI 24.96 kg/m  General: Well Developed, well nourished, and in no acute distress.   MSK: Right knee mild bruising along the medial lower leg.  Tender palpation at medial joint line.  Normal gait.    Lab and Radiology Results No results found for this or any previous visit (from the past 72 hours). MR Knee Right Wo Contrast Result Date: 11/16/2023 CLINICAL DATA:  Medial knee pain following injury 2 weeks ago. No previous relevant surgery. EXAM: MRI OF THE RIGHT KNEE WITHOUT CONTRAST TECHNIQUE: Multiplanar, multisequence MR imaging of the knee was performed. No intravenous contrast was administered. COMPARISON:  Radiographs 11/04/2023. FINDINGS: MENISCI Medial meniscus:  Intact with normal morphology. Lateral meniscus:  Intact with normal morphology. LIGAMENTS Cruciates: The anterior cruciate ligament appears normal. There is a high-grade, probable complete midsubstance tear of the posterior cruciate ligament. Collaterals: Partial tear of the medial collateral ligament with surrounding soft tissue edema. No  significant ligamentous laxity. The lateral collateral ligament complex appears intact. CARTILAGE Patellofemoral: Mild patellofemoral degenerative changes with chondral thinning and mild subchondral cyst formation in the medial patellar facet. Medial: Mild chondral thinning and surface no focal chondral defect. Lateral:  Preserved. MISCELLANEOUS Joint: Small nonspecific knee joint effusion without definite lipohemarthrosis (as questioned on prior radiographs). Popliteal Fossa: The popliteus muscle and tendon are intact. No significant Baker's cyst. Extensor Mechanism: The visualized quadriceps and patellar tendons are intact. Bones: Patchy bone marrow edema posteriorly in the nonweightbearing aspect of the lateral femoral condyle, likely reflecting a bone contusion. Additional bone contusion of the medial tibial plateau anteriorly. No definite cortical fracture. Other: Mild subcutaneous edema both medially and laterally. No focal fluid collection. IMPRESSION: 1. High-grade, probable complete midsubstance tear of the posterior cruciate ligament. 2. Partial tear of the medial collateral ligament. 3. The menisci, anterior cruciate ligament and lateral collateral ligament complex are intact. 4. Bone contusions of the lateral femoral condyle and medial tibial plateau. No definite cortical fracture or lipohemarthrosis identified. Small nonspecific knee joint effusion. Electronically Signed   By: Carey Bullocks M.D.   On: 11/16/2023 16:43   I, Clementeen Graham, personally (independently) visualized and performed the interpretation of the images attached in this note.     Assessment and Plan: 68 y.o. female with knee pain from injury.  Patient has MCL strain and a PCL tear.  Plan to continue hinged knee brace.  Use Tylenol or ibuprofen while away.  I did prescribe hydrocodone that she can  take if needed.  If not improved on recheck consider PT referral and potentially orthopedic surgery consultation.   PDMP reviewed  during this encounter. No orders of the defined types were placed in this encounter.  Meds ordered this encounter  Medications   HYDROcodone-acetaminophen (NORCO/VICODIN) 5-325 MG tablet    Sig: Take 1 tablet by mouth every 6 (six) hours as needed.    Dispense:  15 tablet    Refill:  0     Discussed warning signs or symptoms. Please see discharge instructions. Patient expresses understanding.   The above documentation has been reviewed and is accurate and complete Clementeen Graham, M.D.

## 2023-11-17 NOTE — Telephone Encounter (Signed)
 Noted.

## 2023-11-18 ENCOUNTER — Ambulatory Visit: Payer: Medicare HMO | Admitting: Family Medicine

## 2023-11-18 VITALS — BP 132/76 | HR 85 | Ht 65.0 in | Wt 150.0 lb

## 2023-11-18 DIAGNOSIS — M25561 Pain in right knee: Secondary | ICD-10-CM

## 2023-11-18 DIAGNOSIS — M238X1 Other internal derangements of right knee: Secondary | ICD-10-CM

## 2023-11-18 MED ORDER — HYDROCODONE-ACETAMINOPHEN 5-325 MG PO TABS: 1.0000 | ORAL_TABLET | Freq: Four times a day (QID) | ORAL | 0 refills | Status: DC | PRN

## 2023-11-18 NOTE — Patient Instructions (Addendum)
 Thank you for coming in today.   Continue the knee brace.   Use ibuprofen and tylenol as needed.   Use hydrocodone if you get severe pain.

## 2023-11-20 ENCOUNTER — Encounter: Payer: Self-pay | Admitting: Family Medicine

## 2023-11-20 ENCOUNTER — Other Ambulatory Visit: Payer: Self-pay

## 2023-11-20 DIAGNOSIS — G8929 Other chronic pain: Secondary | ICD-10-CM

## 2023-11-25 ENCOUNTER — Ambulatory Visit

## 2023-11-30 ENCOUNTER — Ambulatory Visit

## 2023-12-02 ENCOUNTER — Ambulatory Visit: Attending: Family Medicine

## 2023-12-02 DIAGNOSIS — S8991XD Unspecified injury of right lower leg, subsequent encounter: Secondary | ICD-10-CM | POA: Diagnosis not present

## 2023-12-02 DIAGNOSIS — S83411D Sprain of medial collateral ligament of right knee, subsequent encounter: Secondary | ICD-10-CM | POA: Insufficient documentation

## 2023-12-02 DIAGNOSIS — M25561 Pain in right knee: Secondary | ICD-10-CM | POA: Insufficient documentation

## 2023-12-02 DIAGNOSIS — M79604 Pain in right leg: Secondary | ICD-10-CM | POA: Insufficient documentation

## 2023-12-02 DIAGNOSIS — G8929 Other chronic pain: Secondary | ICD-10-CM | POA: Insufficient documentation

## 2023-12-02 NOTE — Therapy (Signed)
 OUTPATIENT PHYSICAL THERAPY LOWER EXTREMITY EVALUATION   Janet Brock Name: Janet Brock MRN: [id number] DOB:16-Jan-1956, 68 y.o., female Today's Date: 12/02/2023  END OF SESSION:  PT End of Session - 12/02/23 1154     Visit Number 1    Number of Visits 17    Date for PT Re-Evaluation 01/27/24    Authorization Type 2x/week x 8 weeks (re-cert 02/22/12), Medicare POC progresse noted needed at visit #10    PT Start Time 1030    PT Stop Time 1130    PT Time Calculation (min) 60 min    Activity Tolerance Janet Brock tolerated treatment well    Behavior During Therapy WFL for tasks assessed/performed             Past Medical History:  Diagnosis Date   Anemia    past hx - off Iron   Heart murmur    History of varicella    Hypothyroidism    synthroid  ? hyperthyroid when young and thin and then  dx hypo dx at clinic at work    Migraines    pre menopausal  hx of vicodin  rescue    Past Surgical History:  Procedure Laterality Date   ABLATION     ? year    btl     CATARACT EXTRACTION Bilateral    12/06/2021, 12/20/2021   COLONOSCOPY     approx 2 years ago   HYSTEROSCOPY  05/08/2011   Procedure: HYSTEROSCOPY WITH HYDROTHERMAL ABLATION;  Surgeon: Levi Aland;  Location: [facility];  Service: Gynecology;  Laterality: N/A;   svd       x 2   WISDOM TOOTH EXTRACTION     Janet Brock Active Problem List   Diagnosis Date Noted   Breast lump 05/19/2018   Greater trochanteric bursitis of right hip 11/14/2016   Hypothyroidism     PCP: Dr. Fabian Sharp  REFERRING PROVIDER: Dr. Denyse Amass, MD  REFERRING DIAG: [phone] (ICD-10-CM) - Chronic pain of right knee   THERAPY DIAG:  Sprain of medial collateral ligament of right knee, subsequent encounter  Chronic pain of right knee  Injury of posterior cruciate ligament of right knee, subsequent encounter  Rationale for Evaluation and Treatment: Rehabilitation  ONSET DATE: 10/30/23  SUBJECTIVE:   SUBJECTIVE STATEMENT: Pt presents with recent  R knee injury- was out training her dogs and leash got wrapped around her ankle as a dog went running and pulled her legs out from under her and sh sustained a big fall.  Initially the knee was pretty bruised/swollen after the injury.  Went to see sports medicine physician, had imaging done (see report below).  Has PCL injury and MCL injury.  Not pursuing surgical intervention, would like to work on PT after this injury.  PERTINENT HISTORY: Pt is wearing a knee brace with stabilizers on both sides; overall doing better- pain and swelling have improved She had an injection before going to [place] and was able to do her trip last week (wore brace) She is wearing the brace during the day, not sleeping in it  When wearing the brace she notices she can do most of her activities during the day, but she has not been doing anything without the brace- some discomfort with prolonged use  Current activity level: doing some walking, no turns, no show activities with her dog, wearing knee brace every day  Had a hip injection 2 months ago for her R hip and was doing well with her hip before her knee injury  Managing sx with Tylenol at night and NSAIDs during the day   PAIN:  Are you having pain? Yes, achy feeling; 0/10-2/10    PRECAUTIONS: None  RED FLAGS: None   WEIGHT BEARING RESTRICTIONS: No  FALLS:  Has Janet Brock fallen in last 6 months?  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 Denyse Amass; not scheduled for surgery  OBJECTIVE:  Note: Objective measures were completed at Evaluation unless otherwise noted.  DIAGNOSTIC FINDINGS: MRI of knee 11/15/22 IMPRESSION: 1. High-grade, probable complete midsubstance tear of the posterior cruciate ligament. 2. Partial tear of the medial collateral ligament. 3. The menisci, anterior cruciate ligament and lateral collateral ligament complex are intact. 4. Bone contusions of the lateral femoral condyle and medial tibial plateau. No definite cortical fracture or lipohemarthrosis identified. Small nonspecific knee joint effusion.  Janet Brock SURVEYS:  LEFS 56  COGNITION: Overall cognitive status: Within functional limits for tasks assessed     SENSATION: WFL  EDEMA:  Circumferential: no significant difference between R and L at proximal patella, distal patella, and mid gastroc   POSTURE:  Pt able to stand full weight bearing on R LE without her brace on and no pain  PALPATION: TTP along R MCL, (+) swelling noted  LOWER EXTREMITY ROM:  Active ROM Right eval Left eval  Hip flexion    Hip extension    Hip abduction    Hip adduction    Hip internal rotation    Hip external rotation    Knee flexion 128 140  Knee extension 2 hyperext 5 hyperext  Ankle dorsiflexion    Ankle plantarflexion    Ankle inversion    Ankle eversion     (Blank rows = not tested)  LOWER EXTREMITY MMT:  MMT Right eval Left eval  Hip flexion    Hip extension 4   Hip abduction 4   Hip adduction    Hip internal rotation    Hip external rotation    Knee flexion 4   Knee extension Impaired R quad set Normal L quad set  Ankle dorsiflexion    Ankle plantarflexion    Ankle  inversion    Ankle eversion     (Blank rows = not tested)  LOWER EXTREMITY SPECIAL TESTS:  (+) laxity with posterior drawer R knee (+) laxity and pain with valgus stress test MCL at 20 deg knee flex  FUNCTIONAL TESTS:  Stair assessment:  pt able to ascend/descend 4 stairs in clinic with b/l railing and no brace on, used reciprocal gait pattern and was safe, reports no pain; focused on task without distractions and was safe with slow speed and not carrying anything extra in hands so she could use the railing b/l  Single leg balance: pt able to stand full WB on R LE today without brace and 0/10 pain, improved sway of trunk noted vs L SL balance, 30 second R, 45 second L GAIT: Pt amb wearing short hinged knee brace into/out of clinic, no crutches, able to achieve good R heel strike with brace on                                                                                                                               TREATMENT DATE: 12/02/23  Initial Evaluation performed  Therapeutic exercises: Quad set: 10 second hold x 10 Quad set + SLR x10 SLS 30 seconds x 3 R LE HEP instruction (see below)  Self care/pt education/ brace instructions- Janet Brock EDUCATION:  Education details: PT POC/goals, HEP (see below), discussed self care with continued use of her brace when amb outdoors or when doing physical tasks in home; ok to come out for light casual walking in home on flat surface, long term purpose of PT to improve LE strength/dynamic stability for optimal recovery Person educated: Janet Brock Education method: Explanation, Verbal cues, and Handouts Education comprehension: verbalized understanding, returned demonstration, and needs further education  HOME EXERCISE PROGRAM: Access Code: [email or web address] URL: [email or web address] Date: 12/02/2023 Prepared by: Max Fickle  Exercises - Supine Quad Set  - 2 x daily - 7 x weekly - 2 sets - 10 reps - 5-10 hold - Active Straight  Leg Raise with Quad Set  - 2 x daily - 7 x weekly - 2 sets - 10 reps - Single Leg Stance  - 2 x daily - 7 x weekly - 3 reps - 45 seconds hold  ASSESSMENT:  CLINICAL IMPRESSION: Janet Brock is a 68 y.o. F who was seen today for physical therapy evaluation and treatment for R knee injury on 10/30/23 where she sustained R knee PCL injury and R MCL injury.    OBJECTIVE IMPAIRMENTS: Abnormal gait, decreased activity tolerance, decreased balance, decreased endurance, decreased mobility, difficulty walking, decreased ROM, decreased strength, impaired perceived functional ability, and pain.   ACTIVITY LIMITATIONS: carrying, lifting, bending, standing, squatting, stairs, transfers, locomotion level, and caring for others  PARTICIPATION LIMITATIONS: cleaning, community activity, occupation, and yard work  PERSONAL FACTORS: Past/current experiences and Time since onset of injury/illness/exacerbation are also affecting Janet Brock's functional outcome.   REHAB POTENTIAL: Good  CLINICAL DECISION MAKING: Stable/uncomplicated  EVALUATION COMPLEXITY: Low   GOALS: Goals reviewed with Janet Brock? 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  Improve R LE strength 1 MMT grade for knee extension to facilitate  improved ability to walk/run on uneven surfaces for dog training responsibilities Baseline: 4/5 Goal status: INITIAL  3.  Pt will tolerate being able to walk/run/pivot/change directions quickly on uneven surfaces x 20 minutes for professional dog showing responsibilities without being limited by R knee  pain/instability Baseline: unable; wearing knee brace during the day Goal status: INITIAL   PLAN:  PT FREQUENCY: 2x/week  PT DURATION: 8 weeks  PLANNED INTERVENTIONS: 97110-Therapeutic exercises, 97530- Therapeutic activity, 97112- Neuromuscular re-education, 97535- Self Care, 14782- Manual therapy, 97116- Gait training, Janet Brock/Family education, Taping, Dry Needling, and Cryotherapy  PLAN FOR NEXT SESSION: LE strengthening, quad mm activation, CKC proprioception  Max Fickle, PT, DPT, OCS   Ardine Bjork, PT 12/02/2023, 3:21 PM

## 2023-12-07 ENCOUNTER — Ambulatory Visit

## 2023-12-07 DIAGNOSIS — S8991XD Unspecified injury of right lower leg, subsequent encounter: Secondary | ICD-10-CM

## 2023-12-07 DIAGNOSIS — G8929 Other chronic pain: Secondary | ICD-10-CM

## 2023-12-07 DIAGNOSIS — S83411D Sprain of medial collateral ligament of right knee, subsequent encounter: Secondary | ICD-10-CM | POA: Diagnosis not present

## 2023-12-07 DIAGNOSIS — M25561 Pain in right knee: Secondary | ICD-10-CM | POA: Diagnosis not present

## 2023-12-07 DIAGNOSIS — M79604 Pain in right leg: Secondary | ICD-10-CM | POA: Diagnosis not present

## 2023-12-07 NOTE — Therapy (Signed)
 OUTPATIENT PHYSICAL THERAPY LOWER EXTREMITY EVALUATION   Patient Name: NYCHELLE Brock MRN: [id number] DOB:June 16, 1956, 68 y.o., female Today's Date: 12/07/2023  END OF SESSION:  PT End of Session - 12/07/23 1035     Visit Number 2    Number of Visits 17    Date for PT Re-Evaluation 01/27/24    Authorization Type 2x/week x 8 weeks (re-cert 1/0/27), Medicare POC progresse noted needed at visit #10    PT Start Time 1030    PT Stop Time 1115    PT Time Calculation (min) 45 min    Activity Tolerance Patient tolerated treatment well    Behavior During Therapy WFL for tasks assessed/performed             Past Medical History:  Diagnosis Date   Anemia    past hx - off Iron   Heart murmur    History of varicella    Hypothyroidism    synthroid  ? hyperthyroid when young and thin and then  dx hypo dx at clinic at work    Migraines    pre menopausal  hx of vicodin  rescue    Past Surgical History:  Procedure Laterality Date   ABLATION     ? year    btl     CATARACT EXTRACTION Bilateral    12/06/2021, 12/20/2021   COLONOSCOPY     approx 2 years ago   HYSTEROSCOPY  05/08/2011   Procedure: HYSTEROSCOPY WITH HYDROTHERMAL ABLATION;  Surgeon: Levi Aland;  Location: [facility];  Service: Gynecology;  Laterality: N/A;   svd       x 2   WISDOM TOOTH EXTRACTION     Patient Active Problem List   Diagnosis Date Noted   Breast lump 05/19/2018   Greater trochanteric bursitis of right hip 11/14/2016   Hypothyroidism     PCP: Dr. Fabian Sharp  REFERRING PROVIDER: Dr. Denyse Amass, MD  REFERRING DIAG: [phone] (ICD-10-CM) - Chronic pain of right knee   THERAPY DIAG:  Chronic pain of right knee  Injury of posterior cruciate ligament of right knee, subsequent encounter  Sprain of medial collateral ligament of right knee, subsequent encounter  Pain in right leg  Rationale for Evaluation and Treatment: Rehabilitation  ONSET DATE: 10/30/23  SUBJECTIVE:   SUBJECTIVE STATEMENT: Pt  presents with recent R knee injury- was out training her dogs and leash got wrapped around her ankle as a dog went running and pulled her legs out from under her and sh sustained a big fall.  Initially the knee was pretty bruised/swollen after the injury.  Went to see sports medicine physician, had imaging done (see report below).  Has PCL injury and MCL injury.  Not pursuing surgical intervention, would like to work on PT after this injury.  PERTINENT HISTORY: Pt is wearing a knee brace with stabilizers on both sides; overall doing better- pain and swelling have improved She had an injection before going to [place] and was able to do her trip last week (wore brace) She is wearing the brace during the day, not sleeping in it  When wearing the brace she notices she can do most of her activities during the day, but she has not been doing anything without the brace- some discomfort with prolonged use  Current activity level: doing some walking, no turns, no show activities with her dog, wearing knee brace every day  Had a hip injection 2 months ago for her R hip and was doing well with her hip  before her knee injury  Managing sx with Tylenol at night and NSAIDs during the day   PAIN:  Are you having pain? Yes, achy feeling; 0/10-2/10    PRECAUTIONS: None  RED FLAGS: None   WEIGHT BEARING RESTRICTIONS: No  FALLS:  Has patient fallen in last 6 months?  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 Denyse Amass; not scheduled for surgery  OBJECTIVE:  Note: Objective measures were completed at Evaluation unless otherwise noted.  DIAGNOSTIC FINDINGS: MRI of knee 11/15/22 IMPRESSION: 1. High-grade, probable complete midsubstance tear of the posterior cruciate ligament. 2. Partial tear of the medial collateral ligament. 3. The menisci, anterior cruciate ligament and lateral collateral ligament complex are intact. 4. Bone contusions of the lateral femoral condyle and medial tibial plateau. No definite cortical fracture or lipohemarthrosis identified. Small nonspecific knee joint effusion.  PATIENT SURVEYS:  LEFS 56  COGNITION: Overall cognitive status: Within functional limits for tasks assessed     SENSATION: WFL  EDEMA:  Circumferential: no significant difference between R and L at proximal patella, distal patella, and mid gastroc   POSTURE:  Pt able to stand full weight bearing on R LE without her brace on and no pain  PALPATION: TTP along R MCL, (+) swelling noted  LOWER EXTREMITY ROM:  Active ROM Right eval Left eval  Hip flexion    Hip extension    Hip abduction    Hip adduction    Hip internal rotation    Hip external rotation    Knee flexion 128 140  Knee extension 2 hyperext 5 hyperext  Ankle dorsiflexion    Ankle plantarflexion    Ankle inversion    Ankle eversion     (Blank rows = not tested)  LOWER EXTREMITY MMT:  MMT Right eval Left eval  Hip flexion    Hip extension 4   Hip abduction 4   Hip adduction    Hip internal rotation    Hip external rotation    Knee flexion 4   Knee extension Impaired R quad set Normal L quad set  Ankle dorsiflexion    Ankle  plantarflexion    Ankle inversion    Ankle eversion     (Blank rows = not tested)  LOWER EXTREMITY SPECIAL TESTS:  (+) laxity with posterior drawer R knee (+) laxity and pain with valgus stress test MCL at 20 deg knee flex  FUNCTIONAL TESTS:  Stair assessment:  pt able to ascend/descend 4 stairs in clinic with b/l railing and no brace on, used reciprocal gait pattern and was safe, reports no pain; focused on task without distractions and was safe with slow speed and not carrying anything extra in hands so she could use the  railing b/l    Single leg balance: pt able to stand full WB on R LE today without brace and 0/10 pain, improved sway of trunk noted vs L SL balance, 30 second R, 45 second L GAIT: Pt amb wearing short hinged knee brace into/out of clinic, no crutches, able to achieve good R heel strike with brace on                                                                                                                               TREATMENT DATE: 12/07/23  Subjective: Pt reports that not wearing the brace at home has felt good; her knee is feeling better going up/down the stairs.   Pain 0/10 Objective:   Therapeutic exercises: Knee ROM: flexion/extension AAROM with PT OP for flexion, then pt self knee flexion for end range ROM 5x (SKTC, OP on shin)  Quad set: 10 second hold x 10 Quad set + SLR x10 Bridges: x10, x10 up 2 and down 1 S/l SLR: 5 second holds 2x10 Mini squats x10, with 5# med ball x10 Lateral walking with knee extended 2x15 ft Lateral walking with mini squat 2x15 ft  Neuro re-ed SLS 30 seconds x 3 R LE SLS on airex cushion 3x 30 seconds Use mirror for visual feedback  HEP instruction (see below)  Self care/pt education/ brace instructions- PATIENT EDUCATION:  Education details: PT POC/goals, HEP (see below), discussed self care with continued use of her brace when amb outdoors or when doing physical tasks in home; ok to come out for light casual walking  in home on flat surface, long term purpose of PT to improve LE strength/dynamic stability for optimal recovery Person educated: Patient Education method: Explanation, Verbal cues, and Handouts Education comprehension: verbalized understanding, returned demonstration, and needs further education  HOME EXERCISE PROGRAM: Access Code: [email or web address] URL: [email or web address] Date: 12/07/2023 Prepared by: Max Fickle  Exercises - Supine Quad Set  - 2 x daily - 7 x weekly - 2 sets - 10 reps - 5-10 hold - Active Straight Leg Raise with Quad Set  - 2 x daily - 7 x weekly - 2 sets - 10 reps - Single Leg Stance  - 2 x daily - 7 x weekly - 3 reps - 45 seconds hold - Sidestepping  - 1 x daily - 7 x weekly - 3 reps - Sidelying Diagonal Hip Abduction  - 1 x daily - 7 x weekly - 2 sets - 10 reps - 5 hold - Hooklying Single Knee to Chest  - 1 x daily - 7 x weekly - 5 reps - 5 hold - Supine Lower Trunk Rotation  - 1 x daily - 7 x weekly - 5 reps - 5 hold  ASSESSMENT:  CLINICAL IMPRESSION: Patient tolerated today's tx session well.  She lacks end range knee flexion AROM, added stretch to HEP for this.  R quadriceps inhibition still present, improving from last week.  Use  of mirror for SLS was helpful as she demonstrates improved SLS CKC stability.  Pt notes slight discomfort but no instability with lateral walking today.  She should continue to benefit from skilled PT to address impairments listed below and facilitate return to PLOF after her R knee injury on 10/30/23 where she sustained R knee PCL injury and R MCL injury.    OBJECTIVE IMPAIRMENTS: Abnormal gait, decreased activity tolerance, decreased balance, decreased endurance, decreased mobility, difficulty walking, decreased ROM, decreased strength, impaired perceived functional ability, and pain.   ACTIVITY LIMITATIONS: carrying, lifting, bending, standing, squatting, stairs, transfers, locomotion level, and caring for  others  PARTICIPATION LIMITATIONS: cleaning, community activity, occupation, and yard work  PERSONAL FACTORS: Past/current experiences and Time since onset of injury/illness/exacerbation are also affecting patient's functional outcome.   REHAB POTENTIAL: Good  CLINICAL DECISION MAKING: Stable/uncomplicated  EVALUATION COMPLEXITY: Low   GOALS: Goals reviewed with patient? 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  Improve R LE strength 1 MMT grade for knee extension to facilitate improved ability to walk/run on uneven surfaces for dog training responsibilities Baseline: 4/5 Goal status: INITIAL  3.  Pt will tolerate being able to walk/run/pivot/change directions quickly on uneven surfaces x 20 minutes for professional dog showing responsibilities without being limited by R knee pain/instability Baseline: unable; wearing knee brace during the day Goal status: INITIAL   PLAN:  PT FREQUENCY: 2x/week  PT DURATION: 8 weeks  PLANNED INTERVENTIONS: 97110-Therapeutic exercises, 97530- Therapeutic activity, 97112- Neuromuscular re-education, 97535- Self Care, 16109- Manual therapy, 97116- Gait training, Patient/Family education, Taping, Dry Needling, and Cryotherapy  PLAN FOR NEXT SESSION: LE strengthening, quad mm activation, CKC proprioception  Max Fickle, PT, DPT, OCS   Ardine Bjork, PT 12/07/2023, 10:36 AM

## 2023-12-09 ENCOUNTER — Ambulatory Visit

## 2023-12-09 DIAGNOSIS — M25561 Pain in right knee: Secondary | ICD-10-CM | POA: Diagnosis not present

## 2023-12-09 DIAGNOSIS — G8929 Other chronic pain: Secondary | ICD-10-CM

## 2023-12-09 DIAGNOSIS — S83411D Sprain of medial collateral ligament of right knee, subsequent encounter: Secondary | ICD-10-CM

## 2023-12-09 DIAGNOSIS — M79604 Pain in right leg: Secondary | ICD-10-CM | POA: Diagnosis not present

## 2023-12-09 DIAGNOSIS — S8991XD Unspecified injury of right lower leg, subsequent encounter: Secondary | ICD-10-CM | POA: Diagnosis not present

## 2023-12-09 NOTE — Therapy (Signed)
 OUTPATIENT PHYSICAL THERAPY LOWER EXTREMITY EVALUATION   Patient Name: Janet Brock MRN: [id number] DOB:1956/02/13, 68 y.o., female Today's Date: 12/09/2023  END OF SESSION:  PT End of Session - 12/09/23 1051     Visit Number 3    Number of Visits 17    Date for PT Re-Evaluation 01/27/24    Authorization Type 2x/week x 8 weeks (re-cert 10/24/00), Medicare POC progresse noted needed at visit #10    PT Start Time 1040    PT Stop Time 1120    PT Time Calculation (min) 40 min    Activity Tolerance Patient tolerated treatment well    Behavior During Therapy WFL for tasks assessed/performed             Past Medical History:  Diagnosis Date   Anemia    past hx - off Iron   Heart murmur    History of varicella    Hypothyroidism    synthroid  ? hyperthyroid when young and thin and then  dx hypo dx at clinic at work    Migraines    pre menopausal  hx of vicodin  rescue    Past Surgical History:  Procedure Laterality Date   ABLATION     ? year    btl     CATARACT EXTRACTION Bilateral    12/06/2021, 12/20/2021   COLONOSCOPY     approx 2 years ago   HYSTEROSCOPY  05/08/2011   Procedure: HYSTEROSCOPY WITH HYDROTHERMAL ABLATION;  Surgeon: Levi Aland;  Location: [facility];  Service: Gynecology;  Laterality: N/A;   svd       x 2   WISDOM TOOTH EXTRACTION     Patient Active Problem List   Diagnosis Date Noted   Breast lump 05/19/2018   Greater trochanteric bursitis of right hip 11/14/2016   Hypothyroidism     PCP: Dr. Fabian Sharp  REFERRING PROVIDER: Dr. Denyse Amass, MD  REFERRING DIAG: [phone] (ICD-10-CM) - Chronic pain of right knee   THERAPY DIAG:  Chronic pain of right knee  Injury of posterior cruciate ligament of right knee, subsequent encounter  Sprain of medial collateral ligament of right knee, subsequent encounter  Rationale for Evaluation and Treatment: Rehabilitation  ONSET DATE: 10/30/23  SUBJECTIVE:   SUBJECTIVE STATEMENT: Pt presents with recent  R knee injury- was out training her dogs and leash got wrapped around her ankle as a dog went running and pulled her legs out from under her and sh sustained a big fall.  Initially the knee was pretty bruised/swollen after the injury.  Went to see sports medicine physician, had imaging done (see report below).  Has PCL injury and MCL injury.  Not pursuing surgical intervention, would like to work on PT after this injury.  PERTINENT HISTORY: Pt is wearing a knee brace with stabilizers on both sides; overall doing better- pain and swelling have improved She had an injection before going to [place] and was able to do her trip last week (wore brace) She is wearing the brace during the day, not sleeping in it  When wearing the brace she notices she can do most of her activities during the day, but she has not been doing anything without the brace- some discomfort with prolonged use  Current activity level: doing some walking, no turns, no show activities with her dog, wearing knee brace every day  Had a hip injection 2 months ago for her R hip and was doing well with her hip before her knee injury  Managing sx with Tylenol at night and NSAIDs during the day   PAIN:  Are you having pain? Yes, achy feeling; 0/10-2/10    PRECAUTIONS: None  RED FLAGS: None   WEIGHT BEARING RESTRICTIONS: No  FALLS:  Has patient fallen in last 6 months?  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 Denyse Amass; not scheduled for surgery  OBJECTIVE:  Note: Objective measures were completed at Evaluation unless otherwise noted.  DIAGNOSTIC FINDINGS: MRI of knee 11/15/22 IMPRESSION: 1. High-grade, probable complete midsubstance tear of the posterior cruciate ligament. 2. Partial tear of the medial collateral ligament. 3. The menisci, anterior cruciate ligament and lateral collateral ligament complex are intact. 4. Bone contusions of the lateral femoral condyle and medial tibial plateau. No definite cortical fracture or lipohemarthrosis identified. Small nonspecific knee joint effusion.  PATIENT SURVEYS:  LEFS 56  COGNITION: Overall cognitive status: Within functional limits for tasks assessed     SENSATION: WFL  EDEMA:  Circumferential: no significant difference between R and L at proximal patella, distal patella, and mid gastroc   POSTURE:  Pt able to stand full weight bearing on R LE without her brace on and no pain  PALPATION: TTP along R MCL, (+) swelling noted  LOWER EXTREMITY ROM:  Active ROM Right eval Left eval  Hip flexion    Hip extension    Hip abduction    Hip adduction    Hip internal rotation    Hip external rotation    Knee flexion 128 140  Knee extension 2 hyperext 5 hyperext  Ankle dorsiflexion    Ankle plantarflexion    Ankle inversion    Ankle eversion     (Blank rows = not tested)  LOWER EXTREMITY MMT:  MMT Right eval Left eval  Hip flexion    Hip extension 4   Hip abduction 4   Hip adduction    Hip internal rotation    Hip external rotation    Knee flexion 4   Knee extension Impaired R quad set Normal L quad set  Ankle dorsiflexion    Ankle plantarflexion    Ankle  inversion    Ankle eversion     (Blank rows = not tested)  LOWER EXTREMITY SPECIAL TESTS:  (+) laxity with posterior drawer R knee (+) laxity and pain with valgus stress test MCL at 20 deg knee flex  FUNCTIONAL TESTS:  Stair assessment:  pt able to ascend/descend 4 stairs in clinic with b/l railing and no brace on, used reciprocal gait pattern and was safe, reports no pain; focused on task without distractions and was safe with slow speed and not carrying anything extra in hands so she could use the railing b/l  Single leg balance: pt able to stand full WB on R LE today without brace and 0/10 pain, improved sway of trunk noted vs L SL balance, 30 second R, 45 second L GAIT: Pt amb wearing short hinged knee brace into/out of clinic, no crutches, able to achieve good R heel strike with brace on                                                                                                                               TREATMENT DATE: 12/09/23  Subjective: Pt reports that she worked on her HEP; not wearing the brace at home has been feeling good; she attempted deep squat unintentionally and it was painful in knee  Pain 0/10 Objective:  Nustep: seat #11 x 10 minutes, level 4-5  Therapeutic exercises: Knee ROM: flexion/extension AAROM with PT OP for flexion, then pt self knee flexion for end range ROM 5x (SKTC, OP on shin) Nustep for LE ROM/strength: x 10 min, level 5 Quad set: 10 second hold x 10 Quad set + SLR x10 Bridges: x10, x10 up 2 and down 1 S/l SLR: 5 second holds 2x10 Mini squats x10, with 5# med ball x10, 2 sets Lateral walking with knee extended 2x15 ft Lateral walking with mini squat 2x15 ft Walk in CCW direction with quick reaction to change of direction via external moving target- 3 min  Neuro re-ed SLS 30 seconds x 3 R LE SLS on airex cushion 3x 30 seconds- with ball toss various angles today Use mirror for visual feedback  HEP instruction (see below)  Self  care/pt education/ brace instructions- PATIENT EDUCATION:  Education details: PT POC/goals, HEP (see below), discussed self care with continued use of her brace when amb outdoors or when doing physical tasks in home; ok to come out for light casual walking in home on flat surface, long term purpose of PT to improve LE strength/dynamic stability for optimal recovery Person educated: Patient Education method: Explanation, Verbal cues, and Handouts Education comprehension: verbalized understanding, returned demonstration, and needs further education  HOME EXERCISE PROGRAM: Access Code: [email or web address] URL: [email or web address] Date: 12/07/2023 Prepared by: Max Fickle  Exercises - Supine Quad Set  - 2 x daily - 7 x weekly - 2 sets - 10 reps - 5-10 hold - Active Straight Leg Raise with Quad Set  - 2 x daily - 7 x weekly - 2 sets - 10 reps - Single Leg Stance  - 2 x daily - 7 x weekly - 3 reps - 45 seconds hold - Sidestepping  - 1 x daily - 7 x weekly - 3 reps - Sidelying Diagonal Hip Abduction  - 1 x daily - 7 x weekly - 2 sets - 10 reps - 5 hold - Hooklying Single Knee to Chest  - 1 x daily - 7 x weekly - 5 reps - 5 hold - Supine Lower Trunk Rotation  - 1 x daily - 7 x weekly -  5 reps - 5 hold  ASSESSMENT:  CLINICAL IMPRESSION: Patient tolerated today's tx session well.  She was challenged with LE strengthening; verbal cues for sitting hips back during squat was helpful for technique.  Pt notes no "instability" during session today.  She should continue to benefit from skilled PT to address impairments listed below and facilitate return to PLOF after her R knee injury on 10/30/23 where she sustained R knee PCL injury and R MCL injury.    OBJECTIVE IMPAIRMENTS: Abnormal gait, decreased activity tolerance, decreased balance, decreased endurance, decreased mobility, difficulty walking, decreased ROM, decreased strength, impaired perceived functional ability, and pain.   ACTIVITY  LIMITATIONS: carrying, lifting, bending, standing, squatting, stairs, transfers, locomotion level, and caring for others  PARTICIPATION LIMITATIONS: cleaning, community activity, occupation, and yard work  PERSONAL FACTORS: Past/current experiences and Time since onset of injury/illness/exacerbation are also affecting patient's functional outcome.   REHAB POTENTIAL: Good  CLINICAL DECISION MAKING: Stable/uncomplicated  EVALUATION COMPLEXITY: Low   GOALS: Goals reviewed with patient? 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  Improve R LE strength 1 MMT grade for knee extension to facilitate improved ability to walk/run on uneven surfaces for dog training responsibilities Baseline: 4/5 Goal status: INITIAL  3.  Pt will tolerate being able to walk/run/pivot/change directions quickly on uneven surfaces x 20 minutes for professional dog showing responsibilities without being limited by R knee pain/instability Baseline: unable; wearing knee brace during the day Goal status: INITIAL   PLAN:  PT FREQUENCY: 2x/week  PT DURATION: 8 weeks  PLANNED INTERVENTIONS: 97110-Therapeutic exercises, 97530- Therapeutic activity, 97112- Neuromuscular re-education, 97535- Self Care, 69629- Manual therapy, 97116- Gait training, Patient/Family education, Taping, Dry Needling, and Cryotherapy  PLAN FOR NEXT SESSION: LE strengthening, quad mm activation, CKC proprioception  Max Fickle, PT, DPT, OCS   Ardine Bjork, PT 12/09/2023, 10:52 AM

## 2023-12-14 ENCOUNTER — Ambulatory Visit

## 2023-12-14 DIAGNOSIS — S83411D Sprain of medial collateral ligament of right knee, subsequent encounter: Secondary | ICD-10-CM | POA: Diagnosis not present

## 2023-12-14 DIAGNOSIS — G8929 Other chronic pain: Secondary | ICD-10-CM | POA: Diagnosis not present

## 2023-12-14 DIAGNOSIS — S8991XD Unspecified injury of right lower leg, subsequent encounter: Secondary | ICD-10-CM | POA: Diagnosis not present

## 2023-12-14 DIAGNOSIS — M79604 Pain in right leg: Secondary | ICD-10-CM | POA: Diagnosis not present

## 2023-12-14 DIAGNOSIS — M25561 Pain in right knee: Secondary | ICD-10-CM | POA: Diagnosis not present

## 2023-12-14 NOTE — Therapy (Signed)
 OUTPATIENT PHYSICAL THERAPY LOWER EXTREMITY TREATMENT   Patient Name: Janet Brock MRN: [id number] DOB:[birth year], 68 y.o., female Today's Date: 12/14/2023  END OF SESSION:  PT End of Session - 12/14/23 1055     Visit Number 4    Number of Visits 17    Date for PT Re-Evaluation 01/27/24    Authorization Type 2x/week x 8 weeks (re-cert 4/0/98), Medicare POC progresse noted needed at visit #10    PT Start Time 1040    PT Stop Time 1125    PT Time Calculation (min) 45 min    Activity Tolerance Patient tolerated treatment well    Behavior During Therapy WFL for tasks assessed/performed             Past Medical History:  Diagnosis Date   Anemia    past hx - off Iron   Heart murmur    History of varicella    Hypothyroidism    synthroid  ? hyperthyroid when young and thin and then  dx hypo dx at clinic at work    Migraines    pre menopausal  hx of vicodin  rescue    Past Surgical History:  Procedure Laterality Date   ABLATION     ? year    btl     CATARACT EXTRACTION Bilateral    12/06/2021, 12/20/2021   COLONOSCOPY     approx 2 years ago   HYSTEROSCOPY  05/08/2011   Procedure: HYSTEROSCOPY WITH HYDROTHERMAL ABLATION;  Surgeon: Levi Aland;  Location: [facility];  Service: Gynecology;  Laterality: N/A;   svd       x 2   WISDOM TOOTH EXTRACTION     Patient Active Problem List   Diagnosis Date Noted   Breast lump 05/19/2018   Greater trochanteric bursitis of right hip 11/14/2016   Hypothyroidism     PCP: Dr. Fabian Sharp  REFERRING PROVIDER: Dr. Denyse Amass, MD  REFERRING DIAG: [phone] (ICD-10-CM) - Chronic pain of right knee   THERAPY DIAG:  Chronic pain of right knee  Injury of posterior cruciate ligament of right knee, subsequent encounter  Sprain of medial collateral ligament of right knee, subsequent encounter  Rationale for Evaluation and Treatment: Rehabilitation  ONSET DATE: 10/30/23  SUBJECTIVE:   SUBJECTIVE STATEMENT: Pt presents with recent R  knee injury- was out training her dogs and leash got wrapped around her ankle as a dog went running and pulled her legs out from under her and sh sustained a big fall.  Initially the knee was pretty bruised/swollen after the injury.  Went to see sports medicine physician, had imaging done (see report below).  Has PCL injury and MCL injury.  Not pursuing surgical intervention, would like to work on PT after this injury.  PERTINENT HISTORY: Pt is wearing a knee brace with stabilizers on both sides; overall doing better- pain and swelling have improved She had an injection before going to [place] and was able to do her trip last week (wore brace) She is wearing the brace during the day, not sleeping in it  When wearing the brace she notices she can do most of her activities during the day, but she has not been doing anything without the brace- some discomfort with prolonged use  Current activity level: doing some walking, no turns, no show activities with her dog, wearing knee brace every day  Had a hip injection 2 months ago for her R hip and was doing well with her hip before her knee injury  Managing sx with Tylenol at night and NSAIDs during the day   PAIN:  Are you having pain? Yes, achy feeling; 0/10-2/10    PRECAUTIONS: None  RED FLAGS: None   WEIGHT BEARING RESTRICTIONS: No  FALLS:  Has patient fallen in last 6 months?  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 Denyse Amass; not scheduled for surgery  OBJECTIVE:  Note: Objective measures were completed at Evaluation unless otherwise noted.  DIAGNOSTIC FINDINGS: MRI of knee 11/15/22 IMPRESSION: 1. High-grade, probable complete midsubstance tear of the posterior cruciate ligament. 2. Partial tear of the medial collateral ligament. 3. The menisci, anterior cruciate ligament and lateral collateral ligament complex are intact. 4. Bone contusions of the lateral femoral condyle and medial tibial plateau. No definite cortical fracture or lipohemarthrosis identified. Small nonspecific knee joint effusion.  PATIENT SURVEYS:  LEFS 56  COGNITION: Overall cognitive status: Within functional limits for tasks assessed     SENSATION: WFL  EDEMA:  Circumferential: no significant difference between R and L at proximal patella, distal patella, and mid gastroc   POSTURE:  Pt able to stand full weight bearing on R LE without her brace on and no pain  PALPATION: TTP along R MCL, (+) swelling noted  LOWER EXTREMITY ROM:  Active ROM Right eval Left eval  Hip flexion    Hip extension    Hip abduction    Hip adduction    Hip internal rotation    Hip external rotation    Knee flexion 128 140  Knee extension 2 hyperext 5 hyperext  Ankle dorsiflexion    Ankle plantarflexion    Ankle inversion    Ankle eversion     (Blank rows = not tested)  LOWER EXTREMITY MMT:  MMT Right eval Left eval  Hip flexion    Hip extension 4   Hip abduction 4   Hip adduction    Hip internal rotation    Hip external rotation    Knee flexion 4   Knee extension Impaired R quad set Normal L quad set  Ankle dorsiflexion    Ankle plantarflexion    Ankle  inversion    Ankle eversion     (Blank rows = not tested)  LOWER EXTREMITY SPECIAL TESTS:  (+) laxity with posterior drawer R knee (+) laxity and pain with valgus stress test MCL at 20 deg knee flex  FUNCTIONAL TESTS:  Stair assessment:  pt able to ascend/descend 4 stairs in clinic with b/l railing and no brace on, used reciprocal gait pattern and was safe, reports no pain; focused on task without distractions and was safe with slow speed and not carrying anything extra in hands so she could use the railing b/l  Single leg balance: pt able to stand full WB on R LE today without brace and 0/10 pain, improved sway of trunk noted vs L SL balance, 30 second R, 45 second L GAIT: Pt amb wearing short hinged knee brace into/out of clinic, no crutches, able to achieve good R heel strike with brace on                                                                                                                               TREATMENT DATE: 12/14/23  Subjective: Pt reports that she worked on her HEP; not wearing the brace at home has been feeling good; she attempted deep squat unintentionally and it was painful in knee  Pain 0/10 Objective:  Nustep: seat #11 x 10 minutes, level 4-5  Therapeutic exercises: Knee ROM: flexion/extension AAROM with PT OP for flexion, then pt self knee flexion for end range ROM 5x (SKTC, OP on shin) Nustep for LE ROM/strength: x 10 min, level 5 Quad set + SLR 2x10 3# Bridges: x10, x10 up 2 and down 1- not today S/l SLR hip abd: 3# 2x10 S/l hip add: 3# 2x10 B/l squat to table: 3x 1 min, (2 sets with 9# held in front) Lateral walking with knee extended 2x15 ft- not today Lateral walking with mini squat 2x15 ft- not today Walk in CCW direction with quick reaction to change of direction via external moving target- 3 min Dead lift: 2x9# on 8 inch step: 3x 1 min  Neuro re-ed SLS 30 seconds x 3 R LE SLS on airex cushion 3x 30 seconds- with ball toss various angles-  not today SLS 5# ball toss to trampoline: 30 seconds x 3 SLS 3# ball toss diagonal with PT: 3x30 sec ea direction  Pt demonstrates reciprocal gait on stairs: 3 laps, good/fair control in R knee, without UE use on railing  Pt demonstrates * sign: deep split squat simulating to pick something up (ie: dog food/crate/dog) with R LE behind and L in front recreates her pain, lacks strength to push back up from there  HEP instruction (see below)  Self care/pt education/ brace instructions- PATIENT EDUCATION:  Education details: PT POC/goals, HEP (see below), discussed self care with continued use of her brace when amb outdoors or when doing physical tasks in home; ok to come out for light casual walking in home on flat surface, long term purpose of PT to improve LE strength/dynamic stability for optimal recovery Person educated: Patient Education method: Explanation, Verbal cues, and Handouts Education comprehension: verbalized understanding, returned demonstration, and needs further education  HOME EXERCISE PROGRAM: Access Code: [email or web address] URL: [email or web address] Date: 12/07/2023 Prepared by: Max Fickle  Exercises - Supine Quad Set  - 2 x daily - 7 x weekly - 2 sets - 10 reps - 5-10 hold - Active Straight Leg Raise with Quad Set  - 2 x daily - 7 x weekly - 2 sets - 10 reps - Single  Leg Stance  - 2 x daily - 7 x weekly - 3 reps - 45 seconds hold - Sidestepping  - 1 x daily - 7 x weekly - 3 reps - Sidelying Diagonal Hip Abduction  - 1 x daily - 7 x weekly - 2 sets - 10 reps - 5 hold - Hooklying Single Knee to Chest  - 1 x daily - 7 x weekly - 5 reps - 5 hold - Supine Lower Trunk Rotation  - 1 x daily - 7 x weekly - 5 reps - 5 hold  ASSESSMENT:  CLINICAL IMPRESSION: Patient tolerated progression of LE strengthening well today; no c/o pain, just notes fatigue with squats.  Does benefit from verbal/tactile cues for form during new deadlifts.  Added adductor strengthening  and progressed other SLR with increased resistance.  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   She should continue to benefit from skilled PT to address impairments listed below and facilitate return to PLOF after her R knee injury on 10/30/23 where she sustained R knee PCL injury and R MCL injury.    OBJECTIVE IMPAIRMENTS: Abnormal gait, decreased activity tolerance, decreased balance, decreased endurance, decreased mobility, difficulty walking, decreased ROM, decreased strength, impaired perceived functional ability, and pain.   ACTIVITY LIMITATIONS: carrying, lifting, bending, standing, squatting, stairs, transfers, locomotion level, and caring for others  PARTICIPATION LIMITATIONS: cleaning, community activity, occupation, and yard work  PERSONAL FACTORS: Past/current experiences and Time since onset of injury/illness/exacerbation are also affecting patient's functional outcome.   REHAB POTENTIAL: Good  CLINICAL DECISION MAKING: Stable/uncomplicated  EVALUATION COMPLEXITY: Low   GOALS: Goals reviewed with patient? 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  Improve R LE strength 1 MMT grade for knee extension to facilitate improved ability to walk/run on uneven surfaces for dog training responsibilities Baseline: 4/5 Goal status: INITIAL  3.  Pt will tolerate being able to walk/run/pivot/change directions quickly on uneven surfaces x 20 minutes for professional dog  showing responsibilities without being limited by R knee pain/instability Baseline: unable; wearing knee brace during the day Goal status: INITIAL   PLAN:  PT FREQUENCY: 2x/week  PT DURATION: 8 weeks  PLANNED INTERVENTIONS: 97110-Therapeutic exercises, 97530- Therapeutic activity, 97112- Neuromuscular re-education, 97535- Self Care, [id number]- Manual therapy, 97116- Gait training, Patient/Family education, Taping, Dry Needling, and Cryotherapy  PLAN FOR NEXT SESSION: LE strengthening, quad mm activation, CKC proprioception  Max Fickle, PT, DPT, OCS   Ardine Bjork, PT 12/14/2023, 10:56 AM

## 2023-12-16 ENCOUNTER — Ambulatory Visit

## 2023-12-16 DIAGNOSIS — G8929 Other chronic pain: Secondary | ICD-10-CM | POA: Diagnosis not present

## 2023-12-16 DIAGNOSIS — S83411D Sprain of medial collateral ligament of right knee, subsequent encounter: Secondary | ICD-10-CM

## 2023-12-16 DIAGNOSIS — M25561 Pain in right knee: Secondary | ICD-10-CM | POA: Diagnosis not present

## 2023-12-16 DIAGNOSIS — M79604 Pain in right leg: Secondary | ICD-10-CM | POA: Diagnosis not present

## 2023-12-16 DIAGNOSIS — S8991XD Unspecified injury of right lower leg, subsequent encounter: Secondary | ICD-10-CM | POA: Diagnosis not present

## 2023-12-16 NOTE — Therapy (Signed)
 OUTPATIENT PHYSICAL THERAPY LOWER EXTREMITY TREATMENT   Patient Name: Janet Brock MRN: [id number] DOB:06-14-1956, 68 y.o., female Today's Date: 12/16/2023  END OF SESSION:  PT End of Session - 12/16/23 1038     Visit Number 5    Number of Visits 17    Date for PT Re-Evaluation 01/27/24    Authorization Type 2x/week x 8 weeks (re-cert 09/22/89), Medicare POC progresse noted needed at visit #10    PT Start Time 1030    PT Stop Time 1115    PT Time Calculation (min) 45 min    Activity Tolerance Patient tolerated treatment well    Behavior During Therapy WFL for tasks assessed/performed             Past Medical History:  Diagnosis Date   Anemia    past hx - off Iron   Heart murmur    History of varicella    Hypothyroidism    synthroid  ? hyperthyroid when young and thin and then  dx hypo dx at clinic at work    Migraines    pre menopausal  hx of vicodin  rescue    Past Surgical History:  Procedure Laterality Date   ABLATION     ? year    btl     CATARACT EXTRACTION Bilateral    12/06/2021, 12/20/2021   COLONOSCOPY     approx 2 years ago   HYSTEROSCOPY  05/08/2011   Procedure: HYSTEROSCOPY WITH HYDROTHERMAL ABLATION;  Surgeon: Levi Aland;  Location: [facility];  Service: Gynecology;  Laterality: N/A;   svd       x 2   WISDOM TOOTH EXTRACTION     Patient Active Problem List   Diagnosis Date Noted   Breast lump 05/19/2018   Greater trochanteric bursitis of right hip 11/14/2016   Hypothyroidism     PCP: Dr. Fabian Sharp  REFERRING PROVIDER: Dr. Denyse Amass, MD  REFERRING DIAG: [phone] (ICD-10-CM) - Chronic pain of right knee   THERAPY DIAG:  Chronic pain of right knee  Injury of posterior cruciate ligament of right knee, subsequent encounter  Sprain of medial collateral ligament of right knee, subsequent encounter  Rationale for Evaluation and Treatment: Rehabilitation  ONSET DATE: 10/30/23  SUBJECTIVE:   SUBJECTIVE STATEMENT: Pt presents with recent R  knee injury- was out training her dogs and leash got wrapped around her ankle as a dog went running and pulled her legs out from under her and sh sustained a big fall.  Initially the knee was pretty bruised/swollen after the injury.  Went to see sports medicine physician, had imaging done (see report below).  Has PCL injury and MCL injury.  Not pursuing surgical intervention, would like to work on PT after this injury.  PERTINENT HISTORY: Pt is wearing a knee brace with stabilizers on both sides; overall doing better- pain and swelling have improved She had an injection before going to [place] and was able to do her trip last week (wore brace) She is wearing the brace during the day, not sleeping in it  When wearing the brace she notices she can do most of her activities during the day, but she has not been doing anything without the brace- some discomfort with prolonged use  Current activity level: doing some walking, no turns, no show activities with her dog, wearing knee brace every day  Had a hip injection 2 months ago for her R hip and was doing well with her hip before her knee injury  Managing sx with Tylenol at night and NSAIDs during the day   PAIN:  Are you having pain? Yes, achy feeling; 0/10-2/10    PRECAUTIONS: None  RED FLAGS: None   WEIGHT BEARING RESTRICTIONS: No  FALLS:  Has patient fallen in last 6 months?  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 Denyse Amass; not scheduled for surgery  OBJECTIVE:  Note: Objective measures were completed at Evaluation unless otherwise noted.  DIAGNOSTIC FINDINGS: MRI of knee 11/15/22 IMPRESSION: 1. High-grade, probable complete midsubstance tear of the posterior cruciate ligament. 2. Partial tear of the medial collateral ligament. 3. The menisci, anterior cruciate ligament and lateral collateral ligament complex are intact. 4. Bone contusions of the lateral femoral condyle and medial tibial plateau. No definite cortical fracture or lipohemarthrosis identified. Small nonspecific knee joint effusion.  PATIENT SURVEYS:  LEFS 56  COGNITION: Overall cognitive status: Within functional limits for tasks assessed     SENSATION: WFL  EDEMA:  Circumferential: no significant difference between R and L at proximal patella, distal patella, and mid gastroc   POSTURE:  Pt able to stand full weight bearing on R LE without her brace on and no pain  PALPATION: TTP along R MCL, (+) swelling noted  LOWER EXTREMITY ROM:  Active ROM Right eval Left eval  Hip flexion    Hip extension    Hip abduction    Hip adduction    Hip internal rotation    Hip external rotation    Knee flexion 128 140  Knee extension 2 hyperext 5 hyperext  Ankle dorsiflexion    Ankle plantarflexion    Ankle inversion    Ankle eversion     (Blank rows = not tested)  LOWER EXTREMITY MMT:  MMT Right eval Left eval  Hip flexion    Hip extension 4   Hip abduction 4   Hip adduction    Hip internal rotation    Hip external rotation    Knee flexion 4   Knee extension Impaired R quad set Normal L quad set  Ankle dorsiflexion    Ankle plantarflexion    Ankle  inversion    Ankle eversion     (Blank rows = not tested)  LOWER EXTREMITY SPECIAL TESTS:  (+) laxity with posterior drawer R knee (+) laxity and pain with valgus stress test MCL at 20 deg knee flex  FUNCTIONAL TESTS:  Stair assessment:  pt able to ascend/descend 4 stairs in clinic with b/l railing and no brace on, used reciprocal gait pattern and was safe, reports no pain; focused on task without distractions and was safe with slow speed and not carrying anything extra in hands so she could use the railing b/l  Single leg balance: pt able to stand full WB on R LE today without brace and 0/10 pain, improved sway of trunk noted vs L SL balance, 30 second R, 45 second L GAIT: Pt amb wearing short hinged knee brace into/out of clinic, no crutches, able to achieve good R heel strike with brace on                                                                                                                               TREATMENT DATE: 12/16/23  Subjective: Pt reports that she lifted some boxes of dog food this morning that arrived from the mail before coming to PT.  Her knee did well with this.  She arrives today without knee pain.  Will be traveling this week with dogs; has an 8 inch step for getting in/out of van.  Pain 0/10 Objective:  Nustep: seat #11 x 10 minutes, level 4-5  Therapeutic exercises: Knee ROM: flexion/extension AAROM with PT OP for flexion, then pt self knee flexion for end range ROM 5x (SKTC, OP on shin)- nott oday Nustep for LE ROM/strength: x 10 min, level 5 Quad set + SLR 2x10 3# Bridges:2 x10 up 2 and down  S/l SLR hip abd: 3# 2x10 S/l hip add: 3# 2x10 B/l squat to table: 3x 1 min, (2 sets with 9# held in front) Lateral walking with knee extended 2x15 ft- not today Lateral walking with mini squat 2x15 ft- not today Walk in CCW direction with quick reaction to change of direction via external moving target- 3 min Dead lift: 2x9# on 8 inch step: 3x 1 min Step  sequence: 8 inch: 15x front, 15 lateral step down, 15x forward step down, 2 rounds  Neuro re-ed SLS 30 seconds x 3 R LE SLS on airex cushion 3x 30 seconds- with ball toss various angles- not today SLS 5# ball toss to trampoline: 30 seconds x 3 SLS 3# ball toss diagonal with PT: 3x30 sec ea direction  Pt demonstrates reciprocal gait on stairs: 3 laps, good/fair control in R knee, without UE use on railing  Pt demonstrates * sign: deep split squat simulating to pick something up (ie: dog food/crate/dog) with R LE behind and L in front recreates her pain, lacks strength to push back up from there  HEP instruction (see below)  Self care/pt education/ brace instructions- PATIENT EDUCATION:  Education details: PT POC/goals, HEP (see below), discussed self care with continued use of her brace when amb outdoors or when doing physical tasks in home; ok to come out for light casual walking in home on flat surface, long term purpose of PT to improve LE strength/dynamic stability for optimal recovery Person educated: Patient Education method: Explanation, Verbal cues, and Handouts Education comprehension: verbalized understanding, returned demonstration, and needs further education  HOME EXERCISE PROGRAM: Access Code: [email or web address] Added step up sequence (see medbridge) ASSESSMENT:  CLINICAL IMPRESSION: Patient tolerated progression of LE strengthening well today;  no c/o pain, just notes fatigue in mm at end of session.  Added step up/eccentric step down sequence to her HEP while traveling this week as a way to continue building functional strength.  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   She should continue to benefit from skilled PT to address impairments listed below and facilitate return to PLOF after her R knee injury on 10/30/23 where she sustained R knee PCL injury and R MCL  injury.    OBJECTIVE IMPAIRMENTS: Abnormal gait, decreased activity tolerance, decreased balance, decreased endurance, decreased mobility, difficulty walking, decreased ROM, decreased strength, impaired perceived functional ability, and pain.   ACTIVITY LIMITATIONS: carrying, lifting, bending, standing, squatting, stairs, transfers, locomotion level, and caring for others  PARTICIPATION LIMITATIONS: cleaning, community activity, occupation, and yard work  PERSONAL FACTORS: Past/current experiences and Time since onset of injury/illness/exacerbation are also affecting patient's functional outcome.   REHAB POTENTIAL: Good  CLINICAL DECISION MAKING: Stable/uncomplicated  EVALUATION COMPLEXITY: Low   GOALS: Goals reviewed with patient? 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  Improve R LE strength 1 MMT grade for knee extension to facilitate improved ability to walk/run on uneven surfaces for dog training responsibilities Baseline: 4/5 Goal status: INITIAL  3.  Pt will tolerate being able to walk/run/pivot/change directions quickly on uneven surfaces x 20 minutes for professional dog showing responsibilities without being limited by R knee pain/instability Baseline: unable; wearing knee brace during the day Goal status: INITIAL   PLAN:  PT FREQUENCY: 2x/week  PT DURATION: 8 weeks  PLANNED INTERVENTIONS: 97110-Therapeutic exercises, 97530- Therapeutic activity, 97112- Neuromuscular re-education, 97535- Self Care, 16109- Manual therapy, 97116- Gait training, Patient/Family education,  Taping, Dry Needling, and Cryotherapy  PLAN FOR NEXT SESSION: LE strengthening, quad mm activation, CKC proprioception  Max Fickle, PT, DPT, OCS   Ardine Bjork, PT 12/16/2023, 10:38 AM

## 2023-12-21 ENCOUNTER — Ambulatory Visit

## 2023-12-21 DIAGNOSIS — M79604 Pain in right leg: Secondary | ICD-10-CM | POA: Diagnosis not present

## 2023-12-21 DIAGNOSIS — S83411D Sprain of medial collateral ligament of right knee, subsequent encounter: Secondary | ICD-10-CM

## 2023-12-21 DIAGNOSIS — M25561 Pain in right knee: Secondary | ICD-10-CM | POA: Diagnosis not present

## 2023-12-21 DIAGNOSIS — S8991XD Unspecified injury of right lower leg, subsequent encounter: Secondary | ICD-10-CM | POA: Diagnosis not present

## 2023-12-21 DIAGNOSIS — G8929 Other chronic pain: Secondary | ICD-10-CM

## 2023-12-21 NOTE — Therapy (Signed)
 OUTPATIENT PHYSICAL THERAPY LOWER EXTREMITY TREATMENT   Patient Name: Janet Brock MRN: [id number] DOB:[birth year], 68 y.o., female Today's Date: 12/21/2023  END OF SESSION:  PT End of Session - 12/21/23 1047     Visit Number 6    Number of Visits 17    Date for PT Re-Evaluation 01/27/24    Authorization Type 2x/week x 8 weeks (re-cert 04/23/94), Medicare POC progresse noted needed at visit #10    PT Start Time 1040    PT Stop Time 1125    PT Time Calculation (min) 45 min    Activity Tolerance Patient tolerated treatment well    Behavior During Therapy WFL for tasks assessed/performed             Past Medical History:  Diagnosis Date   Anemia    past hx - off Iron   Heart murmur    History of varicella    Hypothyroidism    synthroid  ? hyperthyroid when young and thin and then  dx hypo dx at clinic at work    Migraines    pre menopausal  hx of vicodin  rescue    Past Surgical History:  Procedure Laterality Date   ABLATION     ? year    btl     CATARACT EXTRACTION Bilateral    12/06/2021, 12/20/2021   COLONOSCOPY     approx 2 years ago   HYSTEROSCOPY  05/08/2011   Procedure: HYSTEROSCOPY WITH HYDROTHERMAL ABLATION;  Surgeon: Levi Aland;  Location: [facility];  Service: Gynecology;  Laterality: N/A;   svd       x 2   WISDOM TOOTH EXTRACTION     Patient Active Problem List   Diagnosis Date Noted   Breast lump 05/19/2018   Greater trochanteric bursitis of right hip 11/14/2016   Hypothyroidism     PCP: Dr. Fabian Sharp  REFERRING PROVIDER: Dr. Denyse Amass, MD  REFERRING DIAG: [id number] (ICD-10-CM) - Chronic pain of right knee   THERAPY DIAG:  Chronic pain of right knee  Injury of posterior cruciate ligament of right knee, subsequent encounter  Sprain of medial collateral ligament of right knee, subsequent encounter  Rationale for Evaluation and Treatment: Rehabilitation  ONSET DATE: 10/30/23  SUBJECTIVE:   SUBJECTIVE STATEMENT: Pt presents with recent R  knee injury- was out training her dogs and leash got wrapped around her ankle as a dog went running and pulled her legs out from under her and sh sustained a big fall.  Initially the knee was pretty bruised/swollen after the injury.  Went to see sports medicine physician, had imaging done (see report below).  Has PCL injury and MCL injury.  Not pursuing surgical intervention, would like to work on PT after this injury.  PERTINENT HISTORY: Pt is wearing a knee brace with stabilizers on both sides; overall doing better- pain and swelling have improved She had an injection before going to [place] and was able to do her trip last week (wore brace) She is wearing the brace during the day, not sleeping in it  When wearing the brace she notices she can do most of her activities during the day, but she has not been doing anything without the brace- some discomfort with prolonged use  Current activity level: doing some walking, no turns, no show activities with her dog, wearing knee brace every day  Had a hip injection 2 months ago for her R hip and was doing well with her hip before her knee injury  Managing sx with Tylenol at night and NSAIDs during the day   PAIN:  Are you having pain? Yes, achy feeling; 0/10-2/10    PRECAUTIONS: None  RED FLAGS: None   WEIGHT BEARING RESTRICTIONS: No  FALLS:  Has patient fallen in last 6 months?  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 Denyse Amass; not scheduled for surgery  OBJECTIVE:  Note: Objective measures were completed at Evaluation unless otherwise noted.  DIAGNOSTIC FINDINGS: MRI of knee 11/15/22 IMPRESSION: 1. High-grade, probable complete midsubstance tear of the posterior cruciate ligament. 2. Partial tear of the medial collateral ligament. 3. The menisci, anterior cruciate ligament and lateral collateral ligament complex are intact. 4. Bone contusions of the lateral femoral condyle and medial tibial plateau. No definite cortical fracture or lipohemarthrosis identified. Small nonspecific knee joint effusion.  PATIENT SURVEYS:  LEFS 56  COGNITION: Overall cognitive status: Within functional limits for tasks assessed     SENSATION: WFL  EDEMA:  Circumferential: no significant difference between R and L at proximal patella, distal patella, and mid gastroc   POSTURE:  Pt able to stand full weight bearing on R LE without her brace on and no pain  PALPATION: TTP along R MCL, (+) swelling noted  LOWER EXTREMITY ROM:  Active ROM Right eval Left eval  Hip flexion    Hip extension    Hip abduction    Hip adduction    Hip internal rotation    Hip external rotation    Knee flexion 128 140  Knee extension 2 hyperext 5 hyperext  Ankle dorsiflexion    Ankle plantarflexion    Ankle inversion    Ankle eversion     (Blank rows = not tested)  LOWER EXTREMITY MMT:  MMT Right eval Left eval  Hip flexion    Hip extension 4   Hip abduction 4   Hip adduction    Hip internal rotation    Hip external rotation    Knee flexion 4   Knee extension Impaired R quad set Normal L quad set  Ankle dorsiflexion    Ankle plantarflexion    Ankle  inversion    Ankle eversion     (Blank rows = not tested)  LOWER EXTREMITY SPECIAL TESTS:  (+) laxity with posterior drawer R knee (+) laxity and pain with valgus stress test MCL at 20 deg knee flex  FUNCTIONAL TESTS:  Stair assessment:  pt able to ascend/descend 4 stairs in clinic with b/l railing and no brace on, used reciprocal gait pattern and was safe, reports no pain; focused on task without distractions and was safe with slow speed and not carrying anything extra in hands so she could use the railing b/l  Single leg balance: pt able to stand full WB on R LE today without brace and 0/10 pain, improved sway of trunk noted vs L SL balance, 30 second R, 45 second L GAIT: Pt amb wearing short hinged knee brace into/out of clinic, no crutches, able to achieve good R heel strike with brace on                                                                                                                               TREATMENT DATE: 12/21/23  Subjective: Pt reports that she worked on HEP while away at the dog show this weekend.  No episodes of knee stability with "down and backs" and light jogging. Had difficulty with strength getting up from deep squat/split squat motion when stacking a pointer.  Pain 0/10 Objective:  Nustep: seat #11 x 10 minutes, level 4-5  Therapeutic exercises: Knee ROM: flexion/extension AAROM with PT OP for flexion, with tibial IR/ER for screw home mech.  Quad set + SLR with hold at top x 15 (3#- no weight today) Bridges:2 x10 up 2 and down  S/l SLR hip abd: 3# 2x10 S/l hip add: 3# 2x10 B/l squat to bix: 3x10, (2 sets with 9# held in front) Lateral walking with mini squat 2x15 ft- blue tb at ankles Walk in CCW direction with quick reaction to change of direction via external moving target- not today  8 inch step: x15 ea direction Step sequence: 8 inch: 15x front, 15 lateral step down, 15x forward step down, 2 rounds  Neuro re-ed- not today SLS 30 seconds x  3 R LE SLS on airex cushion 3x 30 seconds- with ball toss various angles- not today SLS 5# ball toss to trampoline: 30 seconds x 3 SLS 3# ball toss diagonal with PT: 3x30 sec ea direction  Therapeutic activities: Split squat with TRX 2x15, to simulate movement she needs to do when stacking a dog in her competitions Forward/reverse jog on straight/flat surface 6 laps in hallway Lateral shuffles R/L x 3 laps in hallway  Pt demonstrates reciprocal gait on stairs: 3 laps, good/fair control in R knee, without UE use on railing  Pt demonstrates * sign: deep split squat simulating to pick something up (ie: dog food/crate/dog) with R LE behind and L in front recreates her pain, lacks strength to push back up from there  HEP instruction (see below)  Self care/pt education/ brace instructions- PATIENT EDUCATION:  Education details: PT POC/goals, HEP (see below), exercise technique Person educated: Patient Education method: Explanation, Verbal cues, and Handouts Education comprehension: verbalized understanding, returned demonstration, and needs further education  HOME EXERCISE PROGRAM: Access Code: [email or web address] Added step up sequence (see medbridge) ASSESSMENT:  CLINICAL IMPRESSION: Patient tolerated progression of LE strengthening well today; no c/o pain, just notes fatigue in mm at end of session.  Introduced low level multi directional dynamic jogging/lateral movements and pt did not have and knee pain or episodes of instability.  Lacking strength through full ROM of split squat or deep squat, used TRX straps for UE assistance in this movement today.   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   She should continue to benefit from skilled PT to address impairments listed below and facilitate return to PLOF after her R knee injury on 10/30/23 where she sustained R knee PCL injury  and R MCL injury.    OBJECTIVE IMPAIRMENTS: Abnormal gait, decreased activity tolerance, decreased balance, decreased endurance, decreased mobility, difficulty walking, decreased ROM, decreased strength, impaired perceived functional ability, and pain.   ACTIVITY LIMITATIONS: carrying, lifting, bending, standing, squatting, stairs, transfers, locomotion level, and caring for others  PARTICIPATION LIMITATIONS: cleaning, community activity, occupation, and yard work  PERSONAL FACTORS: Past/current experiences and Time since onset of injury/illness/exacerbation are also affecting patient's functional outcome.   REHAB POTENTIAL: Good  CLINICAL DECISION MAKING: Stable/uncomplicated  EVALUATION COMPLEXITY: Low   GOALS: Goals reviewed with patient? 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  Improve R LE strength 1 MMT grade for knee extension to facilitate improved ability to walk/run on uneven surfaces for dog training responsibilities Baseline: 4/5 Goal status: INITIAL  3.  Pt will tolerate being able to walk/run/pivot/change directions quickly on uneven surfaces x 20 minutes for professional dog showing responsibilities without being limited by R knee pain/instability Baseline: unable; wearing knee brace during the day Goal status: INITIAL   PLAN:  PT FREQUENCY: 2x/week  PT DURATION: 8 weeks  PLANNED INTERVENTIONS: 97110-Therapeutic exercises, 97530- Therapeutic activity, 97112- Neuromuscular re-education, 97535- Self Care, 16109- Manual therapy, 97116- Gait training, Patient/Family  education, Taping, Dry Needling, and Cryotherapy  PLAN FOR NEXT SESSION: LE strengthening, quad mm activation, CKC proprioception  Max Fickle, PT, DPT, OCS   Ardine Bjork, PT 12/21/2023, 11:43 AM

## 2023-12-23 ENCOUNTER — Encounter

## 2023-12-28 ENCOUNTER — Ambulatory Visit: Attending: Family Medicine

## 2023-12-28 DIAGNOSIS — G8929 Other chronic pain: Secondary | ICD-10-CM | POA: Diagnosis not present

## 2023-12-28 DIAGNOSIS — M25561 Pain in right knee: Secondary | ICD-10-CM | POA: Diagnosis not present

## 2023-12-28 DIAGNOSIS — S8991XD Unspecified injury of right lower leg, subsequent encounter: Secondary | ICD-10-CM | POA: Insufficient documentation

## 2023-12-28 DIAGNOSIS — S83411D Sprain of medial collateral ligament of right knee, subsequent encounter: Secondary | ICD-10-CM | POA: Insufficient documentation

## 2023-12-28 NOTE — Therapy (Signed)
 OUTPATIENT PHYSICAL THERAPY LOWER EXTREMITY TREATMENT   Patient Name: Janet Brock MRN: [id number] DOB:09/15/1956, 68 y.o., female Today's Date: 12/28/2023  END OF SESSION:  PT End of Session - 12/28/23 1044     Visit Number 7    Number of Visits 17    Date for PT Re-Evaluation 01/27/24    Authorization Type 2x/week x 8 weeks (re-cert 03/29/28), Medicare POC progresse noted needed at visit #10    PT Start Time 1030    PT Stop Time 1115    PT Time Calculation (min) 45 min    Activity Tolerance Patient tolerated treatment well    Behavior During Therapy WFL for tasks assessed/performed             Past Medical History:  Diagnosis Date   Anemia    past hx - off Iron   Heart murmur    History of varicella    Hypothyroidism    synthroid  ? hyperthyroid when young and thin and then  dx hypo dx at clinic at work    Migraines    pre menopausal  hx of vicodin  rescue    Past Surgical History:  Procedure Laterality Date   ABLATION     ? year    btl     CATARACT EXTRACTION Bilateral    12/06/2021, 12/20/2021   COLONOSCOPY     approx 2 years ago   HYSTEROSCOPY  05/08/2011   Procedure: HYSTEROSCOPY WITH HYDROTHERMAL ABLATION;  Surgeon: Levi Aland;  Location: [facility];  Service: Gynecology;  Laterality: N/A;   svd       x 2   WISDOM TOOTH EXTRACTION     Patient Active Problem List   Diagnosis Date Noted   Breast lump 05/19/2018   Greater trochanteric bursitis of right hip 11/14/2016   Hypothyroidism     PCP: Dr. Fabian Sharp  REFERRING PROVIDER: Dr. Denyse Amass, MD  REFERRING DIAG: [phone] (ICD-10-CM) - Chronic pain of right knee   THERAPY DIAG:  Chronic pain of right knee  Injury of posterior cruciate ligament of right knee, subsequent encounter  Sprain of medial collateral ligament of right knee, subsequent encounter  Rationale for Evaluation and Treatment: Rehabilitation  ONSET DATE: 10/30/23  SUBJECTIVE:   SUBJECTIVE STATEMENT: Pt presents with recent R  knee injury- was out training her dogs and leash got wrapped around her ankle as a dog went running and pulled her legs out from under her and sh sustained a big fall.  Initially the knee was pretty bruised/swollen after the injury.  Went to see sports medicine physician, had imaging done (see report below).  Has PCL injury and MCL injury.  Not pursuing surgical intervention, would like to work on PT after this injury.  PERTINENT HISTORY: Pt is wearing a knee brace with stabilizers on both sides; overall doing better- pain and swelling have improved She had an injection before going to [place] and was able to do her trip last week (wore brace) She is wearing the brace during the day, not sleeping in it  When wearing the brace she notices she can do most of her activities during the day, but she has not been doing anything without the brace- some discomfort with prolonged use  Current activity level: doing some walking, no turns, no show activities with her dog, wearing knee brace every day  Had a hip injection 2 months ago for her R hip and was doing well with her hip before her knee injury  Managing sx with Tylenol at night and NSAIDs during the day   PAIN:  Are you having pain? Yes, achy feeling; 0/10-2/10    PRECAUTIONS: None  RED FLAGS: None   WEIGHT BEARING RESTRICTIONS: No  FALLS:  Has patient fallen in last 6 months?  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 Denyse Amass; not scheduled for surgery  OBJECTIVE:  Note: Objective measures were completed at Evaluation unless otherwise noted.  DIAGNOSTIC FINDINGS: MRI of knee 11/15/22 IMPRESSION: 1. High-grade, probable complete midsubstance tear of the posterior cruciate ligament. 2. Partial tear of the medial collateral ligament. 3. The menisci, anterior cruciate ligament and lateral collateral ligament complex are intact. 4. Bone contusions of the lateral femoral condyle and medial tibial plateau. No definite cortical fracture or lipohemarthrosis identified. Small nonspecific knee joint effusion.  PATIENT SURVEYS:  LEFS 56  COGNITION: Overall cognitive status: Within functional limits for tasks assessed     SENSATION: WFL  EDEMA:  Circumferential: no significant difference between R and L at proximal patella, distal patella, and mid gastroc   POSTURE:  Pt able to stand full weight bearing on R LE without her brace on and no pain  PALPATION: TTP along R MCL, (+) swelling noted  LOWER EXTREMITY ROM:  Active ROM Right eval Left eval  Hip flexion    Hip extension    Hip abduction    Hip adduction    Hip internal rotation    Hip external rotation    Knee flexion 128 140  Knee extension 2 hyperext 5 hyperext  Ankle dorsiflexion    Ankle plantarflexion    Ankle inversion    Ankle eversion     (Blank rows = not tested)  LOWER EXTREMITY MMT:  MMT Right eval Left eval  Hip flexion    Hip extension 4   Hip abduction 4   Hip adduction    Hip internal rotation    Hip external rotation    Knee flexion 4   Knee extension Impaired R quad set Normal L quad set  Ankle dorsiflexion    Ankle plantarflexion    Ankle  inversion    Ankle eversion     (Blank rows = not tested)  LOWER EXTREMITY SPECIAL TESTS:  (+) laxity with posterior drawer R knee (+) laxity and pain with valgus stress test MCL at 20 deg knee flex  FUNCTIONAL TESTS:  Stair assessment:  pt able to ascend/descend 4 stairs in clinic with b/l railing and no brace on, used reciprocal gait pattern and was safe, reports no pain; focused on task without distractions and was safe with slow speed and not carrying anything extra in hands so she could use the railing b/l  Single leg balance: pt able to stand full WB on R LE today without brace and 0/10 pain, improved sway of trunk noted vs L SL balance, 30 second R, 45 second L GAIT: Pt amb wearing short hinged knee brace into/out of clinic, no crutches, able to achieve good R heel strike with brace on                                                                                                                               TREATMENT DATE: 4/7//25  Subjective: Pt reports that she was on vacation last week, her knee is feeling good upon arrival  Pain 0/10 Objective:  Nustep: seat #11 x 10 minutes, level 4-5  Therapeutic exercises: Knee ROM: flexion/extension AAROM with PT OP for flexion, with tibial IR/ER for screw home mech.  Quad set + SLR with hold at top x 15 (3#- no weight today) Bridges:2 x10 up 2 and down  S/l SLR hip abd: 3# 2x10 S/l hip add: 3# 2x10 B/l squat to bix: 3x10, (2 sets with 9# held in front) Lateral walking with mini squat 2x15 ft- blue tb at ankles- not today Walk in CCW direction with quick reaction to change of direction via external moving target- not today Wall squats: with white physioball behind back  Lateral lunge: praticed technique, PT verbal/tactile cues 3x10 ea direction, practiced 5 second holds Hamstring stool walk- hallway 4 laps  8 inch step: x15 ea direction Step sequence: 8 inch: 15x front, 15 lateral step down, 15x forward step down, 2 rounds- not  today   Neuro re-ed Front step ups onto bosu with SLS x 5 seconds: 20x Lateral step up and over on bosu: 2 min SLS 30 seconds x 3 R LE SLS on airex cushion 3x 30 seconds- with ball toss various angles- not today SLS 5# ball toss to trampoline: 30 seconds x 3 SLS 3# ball toss diagonal with PT: 3x30 sec ea direction  Therapeutic activities: not today Split squat with TRX 2x15, to simulate movement she needs to do when stacking a dog in her competitions Forward/reverse jog on straight/flat surface 6 laps in hallway Lateral shuffles R/L x 3 laps in hallway  Pt demonstrates reciprocal gait on stairs: 3 laps, good/fair control in R knee, without UE use on railing  Pt demonstrates * sign: deep split squat simulating to pick something up (ie: dog food/crate/dog) with R LE behind and L in front recreates her pain, lacks strength to push back up from there  HEP instruction (see below)  Self care/pt education/ brace instructions- PATIENT EDUCATION:  Education details: PT POC/goals, HEP (see below), exercise technique Person educated: Patient Education method: Explanation, Verbal cues, and Handouts Education comprehension: verbalized understanding, returned demonstration, and needs further education  HOME EXERCISE PROGRAM: Access Code: [email or web address] Added step up sequence (see medbridge) ASSESSMENT:  CLINICAL IMPRESSION: ***Patient tolerated progression of LE strengthening well today; no c/o pain, just notes fatigue in  mm at end of session.  Introduced low level multi directional dynamic jogging/lateral movements and pt did not have and knee pain or episodes of instability.  Lacking strength through full ROM of split squat or deep squat, used TRX straps for UE assistance in this movement today.   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   She should continue to benefit from  skilled PT to address impairments listed below and facilitate return to PLOF after her R knee injury on 10/30/23 where she sustained R knee PCL injury and R MCL injury.    OBJECTIVE IMPAIRMENTS: Abnormal gait, decreased activity tolerance, decreased balance, decreased endurance, decreased mobility, difficulty walking, decreased ROM, decreased strength, impaired perceived functional ability, and pain.   ACTIVITY LIMITATIONS: carrying, lifting, bending, standing, squatting, stairs, transfers, locomotion level, and caring for others  PARTICIPATION LIMITATIONS: cleaning, community activity, occupation, and yard work  PERSONAL FACTORS: Past/current experiences and Time since onset of injury/illness/exacerbation are also affecting patient's functional outcome.   REHAB POTENTIAL: Good  CLINICAL DECISION MAKING: Stable/uncomplicated  EVALUATION COMPLEXITY: Low   GOALS: Goals reviewed with patient? 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  Improve R LE strength 1 MMT grade for knee extension to facilitate improved ability to walk/run on uneven surfaces for dog training responsibilities Baseline: 4/5 Goal status: INITIAL  3.  Pt will tolerate being able to walk/run/pivot/change directions quickly on uneven surfaces x 20 minutes for professional dog showing responsibilities without being limited by R knee pain/instability Baseline: unable; wearing knee brace during the day Goal status: INITIAL   PLAN:  PT FREQUENCY: 2x/week  PT DURATION: 8 weeks  PLANNED INTERVENTIONS: 97110-Therapeutic  exercises, 97530- Therapeutic activity, 97112- Neuromuscular re-education, 97535- Self Care, [id number]- Manual therapy, 97116- Gait training, Patient/Family education, Taping, Dry Needling, and Cryotherapy  PLAN FOR NEXT SESSION: LE strengthening, quad mm activation, CKC proprioception  Max Fickle, PT, DPT, OCS   Ardine Bjork, PT 12/28/2023, 10:44 AM

## 2023-12-30 ENCOUNTER — Ambulatory Visit

## 2023-12-30 DIAGNOSIS — S83411D Sprain of medial collateral ligament of right knee, subsequent encounter: Secondary | ICD-10-CM | POA: Diagnosis not present

## 2023-12-30 DIAGNOSIS — G8929 Other chronic pain: Secondary | ICD-10-CM

## 2023-12-30 DIAGNOSIS — S8991XD Unspecified injury of right lower leg, subsequent encounter: Secondary | ICD-10-CM | POA: Diagnosis not present

## 2023-12-30 DIAGNOSIS — M25561 Pain in right knee: Secondary | ICD-10-CM | POA: Diagnosis not present

## 2023-12-30 NOTE — Therapy (Signed)
 OUTPATIENT PHYSICAL THERAPY LOWER EXTREMITY TREATMENT   Patient Name: Janet Brock MRN: [id number] DOB:[birth year], 68 y.o., female Today's Date: 12/30/2023  END OF SESSION:  PT End of Session - 12/30/23 1044     Visit Number 8    Number of Visits 17    Date for PT Re-Evaluation 01/27/24    Authorization Type 2x/week x 8 weeks (re-cert 4/0/98), Medicare POC progresse noted needed at visit #10    PT Start Time 1035    PT Stop Time 1115    PT Time Calculation (min) 40 min    Activity Tolerance Patient tolerated treatment well    Behavior During Therapy WFL for tasks assessed/performed             Past Medical History:  Diagnosis Date   Anemia    past hx - off Iron   Heart murmur    History of varicella    Hypothyroidism    synthroid  ? hyperthyroid when young and thin and then  dx hypo dx at clinic at work    Migraines    pre menopausal  hx of vicodin  rescue    Past Surgical History:  Procedure Laterality Date   ABLATION     ? year    btl     CATARACT EXTRACTION Bilateral    12/06/2021, 12/20/2021   COLONOSCOPY     approx 2 years ago   HYSTEROSCOPY  05/08/2011   Procedure: HYSTEROSCOPY WITH HYDROTHERMAL ABLATION;  Surgeon: Levi Aland;  Location: [facility];  Service: Gynecology;  Laterality: N/A;   svd       x 2   WISDOM TOOTH EXTRACTION     Patient Active Problem List   Diagnosis Date Noted   Breast lump 05/19/2018   Greater trochanteric bursitis of right hip 11/14/2016   Hypothyroidism     PCP: Dr. Fabian Sharp  REFERRING PROVIDER: Dr. Denyse Amass, MD  REFERRING DIAG: [phone] (ICD-10-CM) - Chronic pain of right knee   THERAPY DIAG:  Chronic pain of right knee  Injury of posterior cruciate ligament of right knee, subsequent encounter  Sprain of medial collateral ligament of right knee, subsequent encounter  Rationale for Evaluation and Treatment: Rehabilitation  ONSET DATE: 10/30/23  SUBJECTIVE:   SUBJECTIVE STATEMENT: Pt presents with recent R  knee injury- was out training her dogs and leash got wrapped around her ankle as a dog went running and pulled her legs out from under her and sh sustained a big fall.  Initially the knee was pretty bruised/swollen after the injury.  Went to see sports medicine physician, had imaging done (see report below).  Has PCL injury and MCL injury.  Not pursuing surgical intervention, would like to work on PT after this injury.  PERTINENT HISTORY: Pt is wearing a knee brace with stabilizers on both sides; overall doing better- pain and swelling have improved She had an injection before going to [place] and was able to do her trip last week (wore brace) She is wearing the brace during the day, not sleeping in it  When wearing the brace she notices she can do most of her activities during the day, but she has not been doing anything without the brace- some discomfort with prolonged use  Current activity level: doing some walking, no turns, no show activities with her dog, wearing knee brace every day  Had a hip injection 2 months ago for her R hip and was doing well with her hip before her knee injury  Managing sx with Tylenol at night and NSAIDs during the day   PAIN:  Are you having pain? Yes, achy feeling; 0/10-2/10    PRECAUTIONS: None  RED FLAGS: None   WEIGHT BEARING RESTRICTIONS: No  FALLS:  Has patient fallen in last 6 months?  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 Denyse Amass; not scheduled for surgery  OBJECTIVE:  Note: Objective measures were completed at Evaluation unless otherwise noted.  DIAGNOSTIC FINDINGS: MRI of knee 11/15/22 IMPRESSION: 1. High-grade, probable complete midsubstance tear of the posterior cruciate ligament. 2. Partial tear of the medial collateral ligament. 3. The menisci, anterior cruciate ligament and lateral collateral ligament complex are intact. 4. Bone contusions of the lateral femoral condyle and medial tibial plateau. No definite cortical fracture or lipohemarthrosis identified. Small nonspecific knee joint effusion.  PATIENT SURVEYS:  LEFS 56  COGNITION: Overall cognitive status: Within functional limits for tasks assessed     SENSATION: WFL  EDEMA:  Circumferential: no significant difference between R and L at proximal patella, distal patella, and mid gastroc   POSTURE:  Pt able to stand full weight bearing on R LE without her brace on and no pain  PALPATION: TTP along R MCL, (+) swelling noted  LOWER EXTREMITY ROM:  Active ROM Right eval Left eval  Hip flexion    Hip extension    Hip abduction    Hip adduction    Hip internal rotation    Hip external rotation    Knee flexion 128 140  Knee extension 2 hyperext 5 hyperext  Ankle dorsiflexion    Ankle plantarflexion    Ankle inversion    Ankle eversion     (Blank rows = not tested)  LOWER EXTREMITY MMT:  MMT Right eval Left eval  Hip flexion    Hip extension 4   Hip abduction 4   Hip adduction    Hip internal rotation    Hip external rotation    Knee flexion 4   Knee extension Impaired R quad set Normal L quad set  Ankle dorsiflexion    Ankle plantarflexion    Ankle  inversion    Ankle eversion     (Blank rows = not tested)  LOWER EXTREMITY SPECIAL TESTS:  (+) laxity with posterior drawer R knee (+) laxity and pain with valgus stress test MCL at 20 deg knee flex  FUNCTIONAL TESTS:  Stair assessment:  pt able to ascend/descend 4 stairs in clinic with b/l railing and no brace on, used reciprocal gait pattern and was safe, reports no pain; focused on task without distractions and was safe with slow speed and not carrying anything extra in hands so she could use the railing b/l  Single leg balance: pt able to stand full WB on R LE today without brace and 0/10 pain, improved sway of trunk noted vs L SL balance, 30 second R, 45 second L GAIT: Pt amb wearing short hinged knee brace into/out of clinic, no crutches, able to achieve good R heel strike with brace on                                                                                                                               TREATMENT DATE: 12/30/23  Subjective: Pt reports she was sore/fatigued at end of last PT session; she noticed some soreness when trying to get up after being prolonged stationary in sitting after workout; she is working on the lateral lunges when she feeding the dog  Pain 0/10 Objective:  Nustep: seat #11 x 10 minutes, level 4-5  Therapeutic exercises: Knee ROM: flexion/extension AAROM with PT OP for flexion, with tibial IR/ER for screw home mech. Not today  Quad set + SLR with hold at top x 15 (3#- no weight today)- not today Bridges:2 x10 up 2 and down- not today S/l SLR hip abd: 3# 2x10 S/l hip add: 3# 2x10 B/l squat to bix: 3x10, (2 sets with 9# held in front) Lateral walking with mini squat 2x15 ft- blue tb at ankles- not today Walk in CCW direction with quick reaction to change of direction via external moving target- not today Wall squats: with white physioball behind back  Lateral lunge: praticed technique, PT verbal/tactile cues 3x10 ea direction, practiced 5  second holds Hamstring stool walk- hallway 4 laps  8 inch step: x15 ea direction Step sequence: 8 inch: 15x front, 15 lateral step down, 15x forward step down, 2 rounds- not today  Therapeutic activities:  Split squat with TRX 2x15, to simulate movement she needs to do when stacking a dog in her competitions Front step up to SLS R LE holding 6# weight x20 Front step up onto [employer] jog on straight/flat surface 6 laps in hallway- not today Lateral shuffles R/L x 3 laps in hallway- not today  Neuro re-ed- not today Lateral step up and over on bosu: 2 min SLS 30 seconds x 3 R LE bosu SLS on airex cushion 3x 30 seconds- with ball toss various angles- not today SLS 5# ball toss to trampoline: 30 seconds x 3- not today SLS 3# ball toss diagonal with PT: 3x30 sec ea direction- not today  Pt demonstrates reciprocal gait on stairs: 3 laps, good/fair control in R knee, without UE use on railing  Pt demonstrates * sign: deep split squat simulating to pick something up (ie: dog food/crate/dog) with R LE behind and L in front recreates her pain, lacks strength to push back up from there  HEP instruction (see below)  Self care/pt education/ brace instructions- d/c brace PATIENT EDUCATION:  Education details: PT POC/goals, HEP (see below), exercise technique Person educated: Patient Education method: Explanation,  Verbal cues, and Handouts Education comprehension: verbalized understanding, returned demonstration, and needs further education  HOME EXERCISE PROGRAM: Access Code: [email or web address] Added step up sequence (see medbridge) ASSESSMENT:  CLINICAL IMPRESSION: Patient tolerated progression of LE strengthening well today; able to perform split squat with TRX through increased ROM before noting discomfort in knee today.  Challenged with hamstring stool scoot exercise.  Should continue to benefit from strengthening and interventions that promote optimal dynamic knee stability and  power to return to PLOF.  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   She should continue to benefit from skilled PT to address impairments listed below and facilitate return to PLOF after her R knee injury on 10/30/23 where she sustained R knee PCL injury and R MCL injury.    OBJECTIVE IMPAIRMENTS: Abnormal gait, decreased activity tolerance, decreased balance, decreased endurance, decreased mobility, difficulty walking, decreased ROM, decreased strength, impaired perceived functional ability, and pain.   ACTIVITY LIMITATIONS: carrying, lifting, bending, standing, squatting, stairs, transfers, locomotion level, and caring for others  PARTICIPATION LIMITATIONS: cleaning, community activity, occupation, and yard work  PERSONAL FACTORS: Past/current experiences and Time since onset of injury/illness/exacerbation are also affecting patient's functional outcome.   REHAB POTENTIAL: Good  CLINICAL DECISION MAKING: Stable/uncomplicated  EVALUATION COMPLEXITY: Low   GOALS: Goals reviewed with patient? 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  Improve R LE strength 1 MMT grade for knee extension to facilitate improved ability to walk/run on uneven surfaces for dog training responsibilities Baseline: 4/5 Goal status: INITIAL  3.  Pt will tolerate being able to walk/run/pivot/change directions quickly on uneven surfaces x 20 minutes for professional dog showing responsibilities  without being limited by R knee pain/instability Baseline: unable; wearing knee brace during the day Goal status: INITIAL   PLAN:  PT FREQUENCY: 2x/week  PT DURATION: 8 weeks  PLANNED INTERVENTIONS: 97110-Therapeutic exercises, 97530- Therapeutic activity, 97112- Neuromuscular re-education, 97535- Self Care, 16109- Manual therapy, 97116- Gait training, Patient/Family education, Taping, Dry Needling, and Cryotherapy  PLAN FOR NEXT SESSION: LE strengthening, quad mm activation, CKC proprioception  Max Fickle, PT, DPT, OCS   Ardine Bjork, PT 12/30/2023, 10:45 AM

## 2024-01-06 ENCOUNTER — Ambulatory Visit

## 2024-01-11 ENCOUNTER — Ambulatory Visit

## 2024-01-11 DIAGNOSIS — G8929 Other chronic pain: Secondary | ICD-10-CM | POA: Diagnosis not present

## 2024-01-11 DIAGNOSIS — S8991XD Unspecified injury of right lower leg, subsequent encounter: Secondary | ICD-10-CM

## 2024-01-11 DIAGNOSIS — M25561 Pain in right knee: Secondary | ICD-10-CM | POA: Diagnosis not present

## 2024-01-11 DIAGNOSIS — S83411D Sprain of medial collateral ligament of right knee, subsequent encounter: Secondary | ICD-10-CM

## 2024-01-11 NOTE — Therapy (Signed)
 OUTPATIENT PHYSICAL THERAPY LOWER EXTREMITY TREATMENT/Progress Note/Recert through 02/21/24   Patient Name: Janet Brock MRN: [id number] DOB:1955/10/29, 68 y.o., female Today's Date: 01/11/2024  END OF SESSION:  PT End of Session - 01/11/24 1558     Visit Number 9    Number of Visits 17    Date for PT Re-Evaluation 01/27/24    Authorization Type 2x/week x 8 weeks (re-cert 02/26/43), Medicare POC progresse noted needed at visit #10    PT Start Time 1550    PT Stop Time 1635    PT Time Calculation (min) 45 min    Activity Tolerance Patient tolerated treatment well    Behavior During Therapy WFL for tasks assessed/performed             Past Medical History:  Diagnosis Date   Anemia    past hx - off Iron   Heart murmur    History of varicella    Hypothyroidism    synthroid   ? hyperthyroid when young and thin and then  dx hypo dx at clinic at work    Migraines    pre menopausal  hx of vicodin  rescue    Past Surgical History:  Procedure Laterality Date   ABLATION     ? year    btl     CATARACT EXTRACTION Bilateral    12/06/2021, 12/20/2021   COLONOSCOPY     approx 2 years ago   HYSTEROSCOPY  05/08/2011   Procedure: HYSTEROSCOPY WITH HYDROTHERMAL ABLATION;  Surgeon: Hamp Levine;  Location: [facility];  Service: Gynecology;  Laterality: N/A;   svd       x 2   WISDOM TOOTH EXTRACTION     Patient Active Problem List   Diagnosis Date Noted   Breast lump 05/19/2018   Greater trochanteric bursitis of right hip 11/14/2016   Hypothyroidism     PCP: Dr. Ethel Henry  REFERRING PROVIDER: Dr. Alease Hunter, MD  REFERRING DIAG: [phone] (ICD-10-CM) - Chronic pain of right knee   THERAPY DIAG:  No diagnosis found.  Rationale for Evaluation and Treatment: Rehabilitation  ONSET DATE: 10/30/23  SUBJECTIVE:   SUBJECTIVE STATEMENT: Pt presents with recent R knee injury- was out training her dogs and leash got wrapped around her ankle as a dog went running and pulled her legs out  from under her and sh sustained a big fall.  Initially the knee was pretty bruised/swollen after the injury.  Went to see sports medicine physician, had imaging done (see report below).  Has PCL injury and MCL injury.  Not pursuing surgical intervention, would like to work on PT after this injury.  PERTINENT HISTORY: Pt is wearing a knee brace with stabilizers on both sides; overall doing better- pain and swelling have improved She had an injection before going to [place] and was able to do her trip last week (wore brace) She is wearing the brace during the day, not sleeping in it  When wearing the brace she notices she can do most of her activities during the day, but she has not been doing anything without the brace- some discomfort with prolonged use  Current activity level: doing some walking, no turns, no show activities with her dog, wearing knee brace every day  Had a hip injection 2 months ago for her R hip and was doing well with her hip before her knee injury  Managing sx with Tylenol  at night and NSAIDs during the day   PAIN:  Are you having pain? Yes, achy  feeling; 0/10-2/10    PRECAUTIONS: None  RED FLAGS: None   WEIGHT BEARING RESTRICTIONS: No  FALLS:  Has patient fallen in last 6 months?  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 Alease Hunter; not scheduled for surgery  OBJECTIVE:  Note: Objective measures were completed at Evaluation unless otherwise noted.  DIAGNOSTIC FINDINGS: MRI of knee 11/15/22 IMPRESSION: 1. High-grade, probable complete midsubstance tear of the posterior cruciate ligament. 2. Partial tear of the medial collateral ligament. 3. The menisci, anterior cruciate ligament and lateral collateral ligament complex are intact. 4. Bone contusions of the lateral femoral condyle and medial tibial plateau. No definite cortical fracture or lipohemarthrosis identified. Small nonspecific knee joint effusion.  PATIENT SURVEYS:  LEFS 56  COGNITION: Overall cognitive status: Within functional limits for tasks assessed     SENSATION: WFL  EDEMA:  Circumferential: no significant difference between R and L at proximal patella, distal patella, and mid gastroc   POSTURE:  Pt able to stand full weight bearing on R LE without her brace on and no pain  PALPATION: TTP along R MCL, (+) swelling noted  LOWER EXTREMITY ROM:  Active ROM Right eval Left eval  Hip flexion    Hip extension    Hip abduction    Hip adduction    Hip internal rotation    Hip external rotation    Knee flexion 128 140  Knee extension 2 hyperext 5 hyperext  Ankle dorsiflexion    Ankle plantarflexion    Ankle inversion    Ankle eversion     (Blank rows = not tested)  LOWER EXTREMITY MMT:  MMT Right eval Left eval  Hip flexion    Hip extension 4   Hip abduction 4   Hip adduction    Hip internal rotation    Hip external rotation    Knee flexion 4   Knee extension Impaired R quad set Normal L quad set  Ankle dorsiflexion    Ankle plantarflexion    Ankle inversion    Ankle eversion     (Blank rows = not tested)  LOWER EXTREMITY SPECIAL TESTS:  (+) laxity with posterior  drawer R knee (+) laxity and pain with valgus stress test MCL at 20 deg knee flex  FUNCTIONAL TESTS:  Stair assessment:  pt able to ascend/descend 4 stairs in clinic with b/l railing and no brace on, used reciprocal gait pattern and was safe, reports no pain; focused on task without distractions and was safe with slow speed and not carrying anything extra in hands so she could use the railing b/l    Single leg balance: pt able to stand full WB on R LE today without brace and 0/10 pain, improved sway of  trunk noted vs L SL balance, 30 second R, 45 second L GAIT: Pt amb wearing short hinged knee brace into/out of clinic, no crutches, able to achieve good R heel strike with brace on                                                                                                                               TREATMENT DATE: 12/30/23  Subjective: Pt reports she was sore/fatigued at end of last PT session; she noticed some soreness when trying to get up after being prolonged stationary in sitting after workout; she is working on the lateral lunges when she feeding the dog  Pain 0/10 Objective:  No longer wearing knee brace (-) TTP at MCL R R knee AROM: 5 degrees hyperextension; 140 degrees flexion R SL balance: 1 minute on flat surface; pt able to stand SLS on bosu x 10 seconds Pt able to ascend and descend flight of stairs with reciprocal gait pattern now, good control Pt not able to perform deep squat through full ROM due to weakness/pain/lack of concentric power Pt not able to perform split squat through full ROM due to weakness/lack of concentric power Knee flexion MMT 4+/5  Therapeutic exercises: Nustep: seat #11 x 10 minutes, level 4-5 Knee ROM: flexion/extension AAROM with PT OP for flexion, with tibial IR/ER for screw home mech.  Quad set + SLR with hold at top x 15 (3#- no weight today)- not today Bridges:2 x10 up 2 and down- not today S/l SLR hip abd: 3# 2x10- not today S/l hip add:  3# 2x10- not today B/l squat to bix: 3x10, (2 sets with 9# held in front) Lateral walking with mini squat 2x15 ft- blue tb at ankles- not today Walk in CCW direction with quick reaction to change of direction via external moving target- not today Wall squats: with white physioball behind back  Lateral lunge: praticed technique, PT verbal/tactile cues 3x10 ea direction, practiced 5 second holds Hamstring stool walk- hallway 4 laps Split squat with TRX 2x15, to simulate movement she needs to do when stacking a dog in her competitions TRX mini squat jumps: 2x10  8 inch step: x15 ea direction Step sequence: 8 inch: 15x front, 15 lateral step down, 15x forward step down, 2 rounds- not today  Therapeutic activities: not today Front step up to SLS R LE holding 6# weight x20 Front step up onto [employer] jog on straight/flat surface 6 laps in hallway- not today Lateral shuffles R/L x 3 laps in hallway- not today  Neuro re-ed: Lateral step up and over on bosu: 2 min- not today SLS 30 seconds x 3 R LE bosu SLS on airex cushion 3x 30 seconds- with ball toss various angles SLS 5# ball toss to trampoline: 30 seconds x 3- not today SLS 3# ball toss diagonal with PT: 3x30 sec ea direction  Pt demonstrates reciprocal gait on stairs: 3 laps, good/fair control in R  knee, without UE use on railing  Pt demonstrates * sign: deep split squat simulating to pick something up (ie: dog food/crate/dog) with R LE behind and L in front recreates her pain, lacks strength to push back up from there  HEP instruction (see below)  Self care/pt education/ brace instructions- d/c brace PATIENT EDUCATION:  Education details: PT POC/goals, HEP (see below), exercise technique Person educated: Patient Education method: Explanation, Verbal cues, and Handouts Education comprehension: verbalized understanding, returned demonstration, and needs further education  HOME EXERCISE PROGRAM: Access Code:  [email or web address] Added step up sequence (see medbridge) ASSESSMENT:  CLINICAL IMPRESSION: Patient demonstrates improvement in objective measurements today for knee AROM and strength compared to her initial evaluation.  Making progress toward PT goals.  Should continue to benefit from strengthening and interventions that promote optimal dynamic knee stability and power to return to PLOF.  Making good progress so far, has started low level lateral dynamic movement and jogging on flat surfaces in clinic without c/o pain or instability.  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   She should continue to benefit from skilled PT to address impairments listed below and facilitate return to PLOF after her R knee injury on 10/30/23 where she sustained R knee PCL injury and R MCL injury.  Plan to continue addressing impairments with LE strength and power deficits through full ROM and single leg CKC dynamic stability during this next phase of rehab.    OBJECTIVE IMPAIRMENTS: Abnormal gait, decreased activity tolerance, decreased balance, decreased endurance, decreased mobility, difficulty walking, decreased ROM, decreased strength, impaired perceived functional ability, and pain.   ACTIVITY LIMITATIONS: carrying, lifting, bending, standing, squatting, stairs, transfers, locomotion level, and caring for others  PARTICIPATION LIMITATIONS: cleaning, community activity, occupation, and yard work  PERSONAL FACTORS: Past/current experiences and Time since onset of injury/illness/exacerbation are also affecting patient's functional outcome.   REHAB POTENTIAL: Good  CLINICAL DECISION MAKING: Stable/uncomplicated  EVALUATION COMPLEXITY: Low   GOALS: Goals reviewed with patient? Yes  SHORT TERM GOALS: Target date: 01/02/24 Pt will achieve 140 deg knee flexion AROM on R (symmetrical to L) to facilitate being  able to perform deep squat for dog and house work responsibilities Baseline:unable, 01/11/24: 140 deg flexion Goal status: Met ROM, not squat yet   LONG TERM GOALS: Target date: 02/21/24  Improve LEFS >18 (MCID 9) indicating pt able to perform her daily activities as a dog trainerwithout being limited by R knee Baseline: 56 Goal status: In progress  2.  Improve R LE strength 1 MMT grade for knee extension to facilitate improved ability to walk/run on uneven surfaces for dog training responsibilities Baseline: 4/5; 4+/5 01/11/24 Goal status: In progress  3.  Pt will tolerate being able to walk/run/pivot/change directions quickly on uneven surfaces x 20 minutes for professional dog showing responsibilities without being limited by R knee pain/instability Baseline: unable; wearing knee brace during the day;  Goal status: In progress   PLAN:  PT FREQUENCY: 2x/week  PT DURATION: 6 weeks  PLANNED INTERVENTIONS: 97110-Therapeutic exercises, 97530- Therapeutic activity, 97112- Neuromuscular re-education, 97535- Self Care, 16109- Manual therapy, [phone]- Gait training, Patient/Family education, Taping, Dry Needling, and Cryotherapy  PLAN FOR NEXT SESSION: LE strengthening, quad mm activation, CKC proprioception; progress note/recert done today; recommend continued PT x 6 weeks  Lucrecia Sables, PT, DPT, OCS   Jahlia Omura E Juanjesus Pepperman, PT 01/11/2024, 3:58 PM

## 2024-01-13 ENCOUNTER — Encounter

## 2024-01-20 ENCOUNTER — Ambulatory Visit

## 2024-01-20 DIAGNOSIS — S8991XD Unspecified injury of right lower leg, subsequent encounter: Secondary | ICD-10-CM | POA: Diagnosis not present

## 2024-01-20 DIAGNOSIS — S83411D Sprain of medial collateral ligament of right knee, subsequent encounter: Secondary | ICD-10-CM

## 2024-01-20 DIAGNOSIS — G8929 Other chronic pain: Secondary | ICD-10-CM

## 2024-01-20 DIAGNOSIS — M25561 Pain in right knee: Secondary | ICD-10-CM | POA: Diagnosis not present

## 2024-01-20 NOTE — Therapy (Signed)
 OUTPATIENT PHYSICAL THERAPY LOWER EXTREMITY TREATMENT/Recert through 02/21/24   Patient Name: Janet Brock MRN: [id number] DOB:01-May-1956, 68 y.o., female Today's Date: 01/20/2024  END OF SESSION:  PT End of Session - 01/20/24 0947     Visit Number 10    Number of Visits 22    Date for PT Re-Evaluation 02/21/24    Authorization Type 2x/week x 6 weeks (re-cert 04/23/94), Medicare POC progresse noted needed at visit #20    PT Start Time 0945    PT Stop Time 1030    PT Time Calculation (min) 45 min    Activity Tolerance Patient tolerated treatment well    Behavior During Therapy WFL for tasks assessed/performed             Past Medical History:  Diagnosis Date   Anemia    past hx - off Iron   Heart murmur    History of varicella    Hypothyroidism    synthroid   ? hyperthyroid when young and thin and then  dx hypo dx at clinic at work    Migraines    pre menopausal  hx of vicodin  rescue    Past Surgical History:  Procedure Laterality Date   ABLATION     ? year    btl     CATARACT EXTRACTION Bilateral    12/06/2021, 12/20/2021   COLONOSCOPY     approx 2 years ago   HYSTEROSCOPY  05/08/2011   Procedure: HYSTEROSCOPY WITH HYDROTHERMAL ABLATION;  Surgeon: Hamp Levine;  Location: [facility];  Service: Gynecology;  Laterality: N/A;   svd       x 2   WISDOM TOOTH EXTRACTION     Patient Active Problem List   Diagnosis Date Noted   Breast lump 05/19/2018   Greater trochanteric bursitis of right hip 11/14/2016   Hypothyroidism     PCP: Dr. Ethel Henry  REFERRING PROVIDER: Dr. Alease Hunter, MD  REFERRING DIAG: [phone] (ICD-10-CM) - Chronic pain of right knee   THERAPY DIAG:  No diagnosis found.  Rationale for Evaluation and Treatment: Rehabilitation  ONSET DATE: 10/30/23  SUBJECTIVE:   SUBJECTIVE STATEMENT: Pt presents with recent R knee injury- was out training her dogs and leash got wrapped around her ankle as a dog went running and pulled her legs out from under  her and sh sustained a big fall.  Initially the knee was pretty bruised/swollen after the injury.  Went to see sports medicine physician, had imaging done (see report below).  Has PCL injury and MCL injury.  Not pursuing surgical intervention, would like to work on PT after this injury.  PERTINENT HISTORY: Pt is wearing a knee brace with stabilizers on both sides; overall doing better- pain and swelling have improved She had an injection before going to [place] and was able to do her trip last week (wore brace) She is wearing the brace during the day, not sleeping in it  When wearing the brace she notices she can do most of her activities during the day, but she has not been doing anything without the brace- some discomfort with prolonged use  Current activity level: doing some walking, no turns, no show activities with her dog, wearing knee brace every day  Had a hip injection 2 months ago for her R hip and was doing well with her hip before her knee injury  Managing sx with Tylenol  at night and NSAIDs during the day   PAIN:  Are you having pain? Yes, achy feeling;  0/10-2/10    PRECAUTIONS: None  RED FLAGS: None   WEIGHT BEARING RESTRICTIONS: No  FALLS:  Has patient fallen in last 6 months?  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 Alease Hunter; not scheduled for surgery  OBJECTIVE:  Note: Objective measures were completed at Evaluation unless otherwise noted.  DIAGNOSTIC FINDINGS: MRI of knee 11/15/22 IMPRESSION: 1. High-grade, probable complete midsubstance tear of the posterior cruciate ligament. 2. Partial tear of the medial collateral ligament. 3. The menisci, anterior cruciate ligament and lateral collateral ligament complex are intact. 4. Bone contusions of the lateral femoral condyle and medial tibial plateau. No definite cortical fracture or lipohemarthrosis identified. Small nonspecific knee joint effusion.  PATIENT SURVEYS:  LEFS 56  COGNITION: Overall cognitive status: Within functional limits for tasks assessed     SENSATION: WFL  EDEMA:  Circumferential: no significant difference between R and L at proximal patella, distal patella, and mid gastroc   POSTURE:  Pt able to stand full weight bearing on R LE without her brace on and no pain  PALPATION: TTP along R MCL, (+) swelling noted  LOWER EXTREMITY ROM:  Active ROM Right eval Left eval  Hip flexion    Hip extension    Hip abduction    Hip adduction    Hip internal rotation    Hip external rotation    Knee flexion 128 140  Knee extension 2 hyperext 5 hyperext  Ankle dorsiflexion    Ankle plantarflexion    Ankle inversion    Ankle eversion     (Blank rows = not tested)  LOWER EXTREMITY MMT:  MMT Right eval Left eval  Hip flexion    Hip extension 4   Hip abduction 4   Hip adduction    Hip internal rotation    Hip external rotation    Knee flexion 4   Knee extension Impaired R quad set Normal L quad set  Ankle dorsiflexion    Ankle plantarflexion    Ankle inversion    Ankle eversion     (Blank rows = not tested)  LOWER EXTREMITY SPECIAL TESTS:  (+) laxity with posterior drawer R  knee (+) laxity and pain with valgus stress test MCL at 20 deg knee flex  FUNCTIONAL TESTS:  Stair assessment:  pt able to ascend/descend 4 stairs in clinic with b/l railing and no brace on, used reciprocal gait pattern and was safe, reports no pain; focused on task without distractions and was safe with slow speed and not carrying anything extra in hands so she could use the railing b/l    Single leg balance: pt able to stand full WB on R LE today without brace and 0/10 pain, improved sway of trunk  noted vs L SL balance, 30 second R, 45 second L GAIT: Pt amb wearing short hinged knee brace into/out of clinic, no crutches, able to achieve good R heel strike with brace on                                                                                                                               TREATMENT DATE: 01/20/24  Subjective: Pt reports she had some soreness in R knee after stepping up into her [employer] carrying some dog supplies while at a dog show.  Able to show the dogs outdoors which involved walking/jogging on grass.    Pain 0/10 Objective:  No longer wearing knee brace (-) TTP at MCL R R knee AROM: 5 degrees hyperextension; 140 degrees flexion R SL balance: 1 minute on flat surface; pt able to stand SLS on bosu x 10 seconds Pt able to ascend and descend flight of stairs with reciprocal gait pattern now, good control Pt not able to perform deep squat through full ROM due to weakness/pain/lack of concentric power Pt not able to perform split squat through full ROM due to weakness/lack of concentric power Knee flexion MMT 4+/5  Therapeutic exercises: Nustep: seat #11 x 10 minutes, level 4-5 Knee ROM: flexion/extension AAROM with PT OP for flexion, with tibial IR/ER for screw home mech.  Quad set + SLR with hold at top x 15 (3#- no weight today)- not today Bridges:2 x10 up 2 and down- not today S/l SLR hip abd: 3# 2x10- not today S/l hip add: 3# 2x10- not today B/l squat to bix:  3x10, (2 sets with 9# held in front) Lateral walking with mini squat 2x15 ft- blue tb at ankles- not today Walk in CCW direction with quick reaction to change of direction via external moving target- not today Wall squats: with white physioball behind back  Lateral lunge: praticed technique, PT verbal/tactile cues 3x10 ea direction, practiced 5 second holds Hamstring stool walk- hallway 4 laps- not today  Split squat with TRX 2x15, to simulate movement she needs to do when stacking a dog in her competitions- not today TRX mini squat jumps: 2x10  8 inch step: x15 ea direction Step sequence: 8 inch: 15x front, 15 lateral step down, 15x forward step down, 2 rounds- not today Dead lifts: 2x 9# weights 2x10 Total gym: holding 8 lb medicine ball x10 b/l, 3x10 R LE only Forward walking lunges on grass: 3 laps x 20 ft   Therapeutic activities: not today Front step up to SLS R LE holding 6# weight x20 Front step up onto [employer] jog on straight/flat surface 6 laps in hallway- not today Lateral shuffles R/L x 3 laps in hallway- not today  Neuro re-ed: Lateral step up and over on bosu: 2 min- not today SLS 30 seconds x 3 R LE bosu  SLS on airex cushion 3x 30 seconds- with ball toss various angles- not today SLS  5# ball toss to trampoline: 30 seconds x 3- not today SLS 3# ball toss diagonal with PT: 3x30 sec ea direction Squats on flat surface of bosu: 3x10   Pt demonstrates reciprocal gait on stairs: 3 laps, good/fair control in R knee, without UE use on railing  Pt demonstrates * sign: deep split squat simulating to pick something up (ie: dog food/crate/dog) with R LE behind and L in front recreates her pain, lacks strength to push back up from there  HEP instruction (see below)  Self care/pt education/ brace instructions- d/c brace PATIENT EDUCATION:  Education details: PT POC/goals, HEP (see below), exercise technique Person educated: Patient Education method:  Explanation, Verbal cues, and Handouts Education comprehension: verbalized understanding, returned demonstration, and needs further education  HOME EXERCISE PROGRAM: Access Code: [email or web address] Added step up sequence (see medbridge) ASSESSMENT:  CLINICAL IMPRESSION: Patient tolerated progression of LE strengthening well today; no c/o knee pain during or at end of session; she reports quadriceps mm fatigue at end of the session.  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   She should continue to benefit from skilled PT to address impairments listed below and facilitate return to PLOF after her R knee injury on 10/30/23 where she sustained R knee PCL injury and R MCL injury.  Plan to continue addressing impairments with LE strength and power deficits through full ROM and single leg CKC dynamic stability during this next phase of rehab.    OBJECTIVE IMPAIRMENTS: Abnormal gait, decreased activity tolerance, decreased balance, decreased endurance, decreased mobility, difficulty walking, decreased ROM, decreased strength, impaired perceived functional ability, and pain.   ACTIVITY LIMITATIONS: carrying, lifting, bending, standing, squatting, stairs, transfers, locomotion level, and caring for others  PARTICIPATION LIMITATIONS: cleaning, community activity, occupation, and yard work  PERSONAL FACTORS: Past/current experiences and Time since onset of injury/illness/exacerbation are also affecting patient's functional outcome.   REHAB POTENTIAL: Good  CLINICAL DECISION MAKING: Stable/uncomplicated  EVALUATION COMPLEXITY: Low   GOALS: Goals reviewed with patient? 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  Improve R LE strength 1 MMT grade for knee extension to facilitate improved ability to walk/run on uneven surfaces for dog training responsibilities Baseline: 4/5; 4+/5 01/11/24 Goal status: In progress  3.  Pt will tolerate being able to walk/run/pivot/change directions quickly on uneven surfaces x 20 minutes for professional dog showing responsibilities without being limited by R knee pain/instability Baseline: unable; wearing knee brace during the day;  Goal status: In progress   PLAN:  PT FREQUENCY: 2x/week  PT DURATION: 6 weeks  PLANNED INTERVENTIONS: 97110-Therapeutic exercises, 97530- Therapeutic activity, 97112- Neuromuscular re-education, 97535- Self Care, 78469- Manual therapy, [phone]- Gait training, Patient/Family education, Taping, Dry Needling, and Cryotherapy  PLAN FOR NEXT SESSION: LE strengthening, quad mm activation, CKC proprioception; progress note/recert done today; recommend continued PT x 6 weeks  Lucrecia Sables, PT, DPT, OCS   Kaelem Brach E Kinleigh Nault, PT 01/20/2024, 9:59  AM

## 2024-01-25 ENCOUNTER — Ambulatory Visit: Attending: Family Medicine

## 2024-01-25 DIAGNOSIS — M79604 Pain in right leg: Secondary | ICD-10-CM | POA: Insufficient documentation

## 2024-01-25 DIAGNOSIS — G8929 Other chronic pain: Secondary | ICD-10-CM | POA: Insufficient documentation

## 2024-01-25 DIAGNOSIS — S8991XD Unspecified injury of right lower leg, subsequent encounter: Secondary | ICD-10-CM | POA: Diagnosis not present

## 2024-01-25 DIAGNOSIS — S83411D Sprain of medial collateral ligament of right knee, subsequent encounter: Secondary | ICD-10-CM | POA: Diagnosis not present

## 2024-01-25 DIAGNOSIS — M25561 Pain in right knee: Secondary | ICD-10-CM | POA: Diagnosis not present

## 2024-01-25 DIAGNOSIS — R29898 Other symptoms and signs involving the musculoskeletal system: Secondary | ICD-10-CM | POA: Diagnosis not present

## 2024-01-25 NOTE — Therapy (Signed)
 OUTPATIENT PHYSICAL THERAPY LOWER EXTREMITY TREATMENT/Recert through 02/21/24   Patient Name: Janet Brock MRN: [id number] DOB:[birth year], 68 y.o., female Today's Date: 01/25/2024  END OF SESSION:  PT End of Session - 01/25/24 1800     Visit Number 11    Number of Visits 22    Date for PT Re-Evaluation 02/21/24    Authorization Type 2x/week x 6 weeks (re-cert 4/0/98), Medicare POC progresse noted needed at visit #20    PT Start Time 1500    PT Stop Time 1545    PT Time Calculation (min) 45 min    Activity Tolerance Patient tolerated treatment well    Behavior During Therapy WFL for tasks assessed/performed             Past Medical History:  Diagnosis Date   Anemia    past hx - off Iron   Heart murmur    History of varicella    Hypothyroidism    synthroid   ? hyperthyroid when young and thin and then  dx hypo dx at clinic at work    Migraines    pre menopausal  hx of vicodin  rescue    Past Surgical History:  Procedure Laterality Date   ABLATION     ? year    btl     CATARACT EXTRACTION Bilateral    12/06/2021, 12/20/2021   COLONOSCOPY     approx 2 years ago   HYSTEROSCOPY  05/08/2011   Procedure: HYSTEROSCOPY WITH HYDROTHERMAL ABLATION;  Surgeon: Hamp Levine;  Location: [facility];  Service: Gynecology;  Laterality: N/A;   svd       x 2   WISDOM TOOTH EXTRACTION     Patient Active Problem List   Diagnosis Date Noted   Breast lump 05/19/2018   Greater trochanteric bursitis of right hip 11/14/2016   Hypothyroidism     PCP: Dr. Ethel Henry  REFERRING PROVIDER: Dr. Alease Hunter, MD  REFERRING DIAG: [id number] (ICD-10-CM) - Chronic pain of right knee   THERAPY DIAG:  Chronic pain of right knee  Sprain of medial collateral ligament of right knee, subsequent encounter  Injury of posterior cruciate ligament of right knee, subsequent encounter  Rationale for Evaluation and Treatment: Rehabilitation  ONSET DATE: 10/30/23  SUBJECTIVE:   SUBJECTIVE STATEMENT: Pt  presents with recent R knee injury- was out training her dogs and leash got wrapped around her ankle as a dog went running and pulled her legs out from under her and sh sustained a big fall.  Initially the knee was pretty bruised/swollen after the injury.  Went to see sports medicine physician, had imaging done (see report below).  Has PCL injury and MCL injury.  Not pursuing surgical intervention, would like to work on PT after this injury.  PERTINENT HISTORY: Pt is wearing a knee brace with stabilizers on both sides; overall doing better- pain and swelling have improved She had an injection before going to [place] and was able to do her trip last week (wore brace) She is wearing the brace during the day, not sleeping in it  When wearing the brace she notices she can do most of her activities during the day, but she has not been doing anything without the brace- some discomfort with prolonged use  Current activity level: doing some walking, no turns, no show activities with her dog, wearing knee brace every day  Had a hip injection 2 months ago for her R hip and was doing well with her hip before her knee  injury  Managing sx with Tylenol  at night and NSAIDs during the day   PAIN:  Are you having pain? Yes, achy feeling; 0/10-2/10    PRECAUTIONS: None  RED FLAGS: None   WEIGHT BEARING RESTRICTIONS: No  FALLS:  Has patient fallen in last 6 months?  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 Alease Hunter; not scheduled for surgery  OBJECTIVE:  Note: Objective measures were completed at Evaluation unless otherwise noted.  DIAGNOSTIC FINDINGS: MRI of knee 11/15/22 IMPRESSION: 1. High-grade, probable complete midsubstance tear of the posterior cruciate ligament. 2. Partial tear of the medial collateral ligament. 3. The menisci, anterior cruciate ligament and lateral collateral ligament complex are intact. 4. Bone contusions of the lateral femoral condyle and medial tibial plateau. No definite cortical fracture or lipohemarthrosis identified. Small nonspecific knee joint effusion.  PATIENT SURVEYS:  LEFS 56  COGNITION: Overall cognitive status: Within functional limits for tasks assessed     SENSATION: WFL  EDEMA:  Circumferential: no significant difference between R and L at proximal patella, distal patella, and mid gastroc   POSTURE:  Pt able to stand full weight bearing on R LE without her brace on and no pain  PALPATION: TTP along R MCL, (+) swelling noted  LOWER EXTREMITY ROM:  Active ROM Right eval Left eval  Hip flexion    Hip extension    Hip abduction    Hip adduction    Hip internal rotation    Hip external rotation    Knee flexion 128 140  Knee extension 2 hyperext 5 hyperext  Ankle dorsiflexion    Ankle plantarflexion    Ankle inversion    Ankle eversion     (Blank rows = not tested)  LOWER EXTREMITY MMT:  MMT Right eval Left eval  Hip flexion    Hip extension 4   Hip abduction 4   Hip adduction    Hip internal rotation    Hip external rotation    Knee flexion 4   Knee extension Impaired R quad set Normal L quad set  Ankle dorsiflexion    Ankle  plantarflexion    Ankle inversion    Ankle eversion     (Blank rows = not tested)  LOWER EXTREMITY SPECIAL TESTS:  (+) laxity with posterior drawer R knee (+) laxity and pain with valgus stress test MCL at 20 deg knee flex  FUNCTIONAL TESTS:  Stair assessment:  pt able to ascend/descend 4 stairs in clinic with b/l railing and no brace on, used reciprocal gait pattern and was safe, reports no pain; focused on task without distractions and was safe with slow speed and not carrying anything extra in hands so she could use the railing b/l  Single leg balance: pt able to stand full WB on R LE today without brace and 0/10 pain, improved sway of trunk noted vs L SL balance, 30 second R, 45 second L GAIT: Pt amb wearing short hinged knee brace into/out of clinic, no crutches, able to achieve good R heel strike with brace on                                                                                                                               TREATMENT DATE: 01/25/24  Subjective: Pt reports she is feeling stronger; still having some difficulty with split squat or deep squat.  Showing dogs at the dog show went well last week, no significant issues with her knee that she reports.  Pain 0/10 Objective:  No longer wearing knee brace (-) TTP at MCL R R knee AROM: 5 degrees hyperextension; 140 degrees flexion R SL balance: 1 minute on flat surface; pt able to stand SLS on bosu x 10 seconds Pt able to ascend and descend flight of stairs with reciprocal gait pattern now, good control Pt not able to perform deep squat through full ROM due to weakness/pain/lack of concentric power Pt not able to perform split squat through full ROM due to weakness/lack of concentric power Knee flexion MMT 4+/5  Therapeutic exercises: Nustep: seat #11 x 10 minutes, level 4-5 Knee ROM: flexion/extension AAROM with PT OP for flexion, with tibial IR/ER for screw home mech.  Quad set + SLR with hold at top x 15 (3#-  no weight today)- not today Bridges:2 x10 up 2 and down- not today S/l SLR hip abd: 3# 2x10- not today S/l hip add: 3# 2x10- not today B/l squat to bix: 3x10, (2 sets with 9# held in front) Lateral walking with mini squat 2x15 ft- blue tb at ankles- not today Walk in CCW direction with quick reaction to change of direction via external moving target- not today Wall squats: with white physioball behind back  Lateral lunge: praticed technique, PT verbal/tactile cues 3x10 ea direction, practiced 5 second holds Hamstring stool walk- hallway 4 laps   Split squat with TRX 2x15, to simulate movement she needs to do when stacking a dog in her competitions- not today TRX mini squat jumps: 2x10  8 inch step: x15 ea direction Step sequence: 8 inch: 15x front, 15 lateral step down, 15x forward step down, 2 rounds- not today, discussed how to progress for HEP with holding a 8-10 lb weight Dead lifts: 2x 9# weights 2x10 Total gym: holding 8 lb medicine ball x10 b/l, 3x10 R LE only Forward walking lunges on grass: 3 laps x 20 ft Walking forward lunge with trunk twist: x3 laps  Therapeutic activities: not today Front step up to SLS R LE holding 6# weight x20 Front step up onto [employer] jog on straight/flat surface 6 laps in hallway- not today Lateral shuffles R/L x 3 laps in hallway- not today  Neuro re-ed: Bosu squat holds: 5 second holds x 20, 2 sets (standing on flat surface) Bosu stand on soft part mini squat with chest pass at trampoline 3x 1 min intervals Lateral step up and over on bosu: 2 min- not today SLS 30 seconds x 3 R LE bosu  SLS on airex cushion 3x 30 seconds- with ball toss various angles- not today SLS 5# ball toss to trampoline: 30 seconds x 3- not today SLS 3# ball toss diagonal with PT: 3x30 sec ea direction  Pt demonstrates reciprocal gait on stairs: 3 laps, good/fair control in R knee, without UE use on railing  Pt demonstrates * sign: deep split squat  simulating to pick something up (ie: dog food/crate/dog) with R LE behind and L in front recreates her pain, lacks strength to push back up from there  HEP instruction (see below)  Self care/pt education/ brace instructions- d/c brace PATIENT EDUCATION:  Education details: PT POC/goals, HEP (see below), exercise technique Person educated: Patient Education method: Explanation, Verbal cues, and Handouts Education comprehension: verbalized understanding, returned demonstration, and needs further education  HOME EXERCISE PROGRAM: Access Code: [email or web address] Added step up sequence (see medbridge) ASSESSMENT:  CLINICAL IMPRESSION: Patient tolerated progression of LE strengthening well today; no c/o knee pain during or at end of session; she reports quadriceps mm fatigue at end of the session.  Overall single leg balance and CKC proprioception  is improving- was able to perform SLS with less trunk sway today.  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   She should continue to benefit from skilled PT to address impairments listed below and facilitate return to PLOF after her R knee injury on 10/30/23 where she sustained R knee PCL injury and R MCL injury.  Plan to continue addressing impairments with LE strength and power deficits through full ROM and single leg CKC dynamic stability during this next phase of rehab.    OBJECTIVE IMPAIRMENTS: Abnormal gait, decreased activity tolerance, decreased balance, decreased endurance, decreased mobility, difficulty walking, decreased ROM, decreased strength, impaired perceived functional ability, and pain.   ACTIVITY LIMITATIONS: carrying, lifting, bending, standing, squatting, stairs, transfers, locomotion level, and caring for others  PARTICIPATION LIMITATIONS: cleaning, community activity, occupation, and yard work  PERSONAL  FACTORS: Past/current experiences and Time since onset of injury/illness/exacerbation are also affecting patient's functional outcome.   REHAB POTENTIAL: Good  CLINICAL DECISION MAKING: Stable/uncomplicated  EVALUATION COMPLEXITY: Low   GOALS: Goals reviewed with patient? 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  Improve R LE strength 1 MMT grade for knee extension to facilitate improved ability to walk/run on uneven surfaces for dog training responsibilities Baseline: 4/5; 4+/5 01/11/24 Goal status: In progress  3.  Pt will tolerate being able to walk/run/pivot/change directions quickly on uneven surfaces x 20 minutes for professional dog showing responsibilities without being limited by R knee pain/instability Baseline: unable; wearing knee brace during the day;  Goal status: In progress   PLAN:  PT FREQUENCY: 2x/week  PT DURATION: 6 weeks  PLANNED INTERVENTIONS: 97110-Therapeutic exercises, 97530- Therapeutic activity, 97112- Neuromuscular re-education, 97535- Self Care, [id number]- Manual therapy, [phone]- Gait training, Patient/Family education, Taping, Dry Needling, and Cryotherapy  PLAN FOR NEXT SESSION: LE strengthening, quad mm activation, CKC proprioception; progress note/recert done today; recommend continued PT x 6 weeks  Lucrecia Sables, PT, DPT, OCS   Kassi Esteve E Vinton Layson, PT 01/25/2024, 6:01 PM

## 2024-01-27 ENCOUNTER — Ambulatory Visit

## 2024-01-27 DIAGNOSIS — S8991XD Unspecified injury of right lower leg, subsequent encounter: Secondary | ICD-10-CM

## 2024-01-27 DIAGNOSIS — G8929 Other chronic pain: Secondary | ICD-10-CM

## 2024-01-27 DIAGNOSIS — R29898 Other symptoms and signs involving the musculoskeletal system: Secondary | ICD-10-CM | POA: Diagnosis not present

## 2024-01-27 DIAGNOSIS — S83411D Sprain of medial collateral ligament of right knee, subsequent encounter: Secondary | ICD-10-CM | POA: Diagnosis not present

## 2024-01-27 DIAGNOSIS — M25561 Pain in right knee: Secondary | ICD-10-CM | POA: Diagnosis not present

## 2024-01-27 DIAGNOSIS — M79604 Pain in right leg: Secondary | ICD-10-CM | POA: Diagnosis not present

## 2024-01-27 NOTE — Therapy (Signed)
 OUTPATIENT PHYSICAL THERAPY LOWER EXTREMITY TREATMENT/Recert through 02/21/24   Patient Name: Janet Brock MRN: [id number] DOB:10-Jun-1956, 68 y.o., female Today's Date: 01/27/2024  END OF SESSION:  PT End of Session - 01/27/24 0957     Visit Number 12    Number of Visits 22    Date for PT Re-Evaluation 02/21/24    Authorization Type 2x/week x 6 weeks (re-cert 02/21/94), Medicare POC progresse noted needed at visit #20    PT Start Time 0945    PT Stop Time 1030    PT Time Calculation (min) 45 min    Activity Tolerance Patient tolerated treatment well    Behavior During Therapy WFL for tasks assessed/performed             Past Medical History:  Diagnosis Date   Anemia    past hx - off Iron   Heart murmur    History of varicella    Hypothyroidism    synthroid   ? hyperthyroid when young and thin and then  dx hypo dx at clinic at work    Migraines    pre menopausal  hx of vicodin  rescue    Past Surgical History:  Procedure Laterality Date   ABLATION     ? year    btl     CATARACT EXTRACTION Bilateral    12/06/2021, 12/20/2021   COLONOSCOPY     approx 2 years ago   HYSTEROSCOPY  05/08/2011   Procedure: HYSTEROSCOPY WITH HYDROTHERMAL ABLATION;  Surgeon: Hamp Levine;  Location: [facility];  Service: Gynecology;  Laterality: N/A;   svd       x 2   WISDOM TOOTH EXTRACTION     Patient Active Problem List   Diagnosis Date Noted   Breast lump 05/19/2018   Greater trochanteric bursitis of right hip 11/14/2016   Hypothyroidism     PCP: Dr. Ethel Henry  REFERRING PROVIDER: Dr. Alease Hunter, MD  REFERRING DIAG: [phone] (ICD-10-CM) - Chronic pain of right knee   THERAPY DIAG:  Chronic pain of right knee  Sprain of medial collateral ligament of right knee, subsequent encounter  Injury of posterior cruciate ligament of right knee, subsequent encounter  Rationale for Evaluation and Treatment: Rehabilitation  ONSET DATE: 10/30/23  SUBJECTIVE:   SUBJECTIVE STATEMENT: Pt  presents with recent R knee injury- was out training her dogs and leash got wrapped around her ankle as a dog went running and pulled her legs out from under her and sh sustained a big fall.  Initially the knee was pretty bruised/swollen after the injury.  Went to see sports medicine physician, had imaging done (see report below).  Has PCL injury and MCL injury.  Not pursuing surgical intervention, would like to work on PT after this injury.  PERTINENT HISTORY: Pt is wearing a knee brace with stabilizers on both sides; overall doing better- pain and swelling have improved She had an injection before going to [place] and was able to do her trip last week (wore brace) She is wearing the brace during the day, not sleeping in it  When wearing the brace she notices she can do most of her activities during the day, but she has not been doing anything without the brace- some discomfort with prolonged use  Current activity level: doing some walking, no turns, no show activities with her dog, wearing knee brace every day  Had a hip injection 2 months ago for her R hip and was doing well with her hip before her knee  injury  Managing sx with Tylenol  at night and NSAIDs during the day   PAIN:  Are you having pain? Yes, achy feeling; 0/10-2/10    PRECAUTIONS: None  RED FLAGS: None   WEIGHT BEARING RESTRICTIONS: No  FALLS:  Has patient fallen in last 6 months? 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 Alease Hunter; not scheduled for surgery  OBJECTIVE:  Note: Objective measures were completed at Evaluation unless otherwise noted.  DIAGNOSTIC FINDINGS: MRI of knee 11/15/22 IMPRESSION: 1. High-grade, probable complete midsubstance tear of the posterior cruciate ligament. 2. Partial tear of the medial collateral ligament. 3. The menisci, anterior cruciate ligament and lateral collateral ligament complex are intact. 4. Bone contusions of the lateral femoral condyle and medial tibial plateau. No definite cortical fracture or lipohemarthrosis identified. Small nonspecific knee joint effusion.  PATIENT SURVEYS:  LEFS 56  COGNITION: Overall cognitive status: Within functional limits for tasks assessed     SENSATION: WFL  EDEMA:  Circumferential: no significant difference between R and L at proximal patella, distal patella, and mid gastroc   POSTURE: Pt able to stand full weight bearing on R LE without her brace on and no pain  PALPATION: TTP along R MCL, (+) swelling noted  LOWER EXTREMITY ROM:  Active ROM Right eval Left eval  Hip flexion    Hip extension    Hip abduction    Hip adduction    Hip internal rotation    Hip external rotation    Knee flexion 128 140  Knee extension 2 hyperext 5 hyperext  Ankle dorsiflexion    Ankle plantarflexion    Ankle inversion    Ankle eversion     (Blank rows = not tested)  LOWER EXTREMITY MMT:  MMT Right eval Left eval  Hip flexion    Hip extension 4   Hip abduction 4   Hip adduction    Hip internal rotation    Hip external rotation    Knee flexion 4   Knee extension Impaired R quad set Normal L quad set  Ankle dorsiflexion    Ankle  plantarflexion    Ankle inversion    Ankle eversion     (Blank rows = not tested)  LOWER EXTREMITY SPECIAL TESTS:  (+) laxity with posterior drawer R knee (+) laxity and pain with valgus stress test MCL at 20 deg knee flex  FUNCTIONAL TESTS:  Stair assessment: pt able to ascend/descend 4 stairs in clinic with b/l railing and no brace on, used reciprocal gait pattern and was safe, reports no pain; focused on task without distractions and was safe with slow speed and not carrying anything extra in hands so she could use the railing b/l    Single  leg balance: pt able to stand full WB on R LE today without brace and 0/10 pain, improved sway of trunk noted vs L SL balance, 30 second R, 45 second L GAIT: Pt amb wearing short hinged knee brace into/out of clinic, no crutches, able to achieve good R heel strike with brace on                                                                                                                               TREATMENT DATE: 01/27/24  Subjective: Pt reports she was not sore after PT on Monday.  She worked on the step ups with holding a [employer] (2 gallon) for added resistance.  Pain 0/10 Objective:  No longer wearing knee brace (-) TTP at MCL R R knee AROM: 5 degrees hyperextension; 140 degrees flexion R SL balance: 1 minute on flat surface; pt able to stand SLS on bosu x 10 seconds Pt able to ascend and descend flight of stairs with reciprocal gait pattern now, good control Pt not able to perform deep squat through full ROM due to weakness/pain/lack of concentric power Pt not able to perform split squat through full ROM due to weakness/lack of concentric power Knee flexion MMT 4+/5  Therapeutic exercises: Nustep: seat #11 x 10 minutes, level 4-5 Knee ROM: flexion/extension AAROM with PT OP for flexion, with tibial IR/ER for screw home mech.  Quad set + SLR with hold at top x 15 (3#- no weight today)- not today Bridges:2 x10 up 2 and down- not  today S/l SLR hip abd: 3# 2x10- not today S/l hip add: 3# 2x10- not today B/l squat to bix: 3x10, (2 sets with 9# held in front) Lateral walking with mini squat 2x15 ft- blue tb at ankles- not today Walk in CCW direction with quick reaction to change of direction via external moving target- not today Wall squats: with white physioball behind back  Lateral lunge: praticed technique, PT verbal/tactile cues 3x10 ea direction, practiced 5 second holds Hamstring stool walk- hallway 4 laps   Split squat with TRX 2x15, to simulate movement she needs to do when stacking a dog in her competitions- not today TRX mini squat jumps: 2x10  8 inch step: x15 ea direction Step sequence: 8 inch: 15x front, 15 lateral step down, 15x forward step down, 2 rounds- not today, discussed how to progress for HEP with holding a 8-10 lb weight Dead lifts: 2x 9# weights 2x10 Total gym: holding 10 lb medicine ball x10 b/l, 3x10 R LE only Forward walking lunges on grass: 3 laps x 20 ft Walking forward lunge with trunk twist: x3 laps Walking forward lunges with mini front leg hops R/L, 3 laps x 20 ft  Therapeutic activities: not today Front step up to SLS R LE holding 6# weight x20 Front step up onto [employer] jog on straight/flat surface 6 laps in hallway- not today Lateral shuffles R/L x 3  laps in hallway- not today   Neuro re-ed: Bosu squat holds: 5 second holds x 20, 2 sets (standing on flat surface) Bosu stand on soft part mini squat with chest pass at trampoline 3x 1 min intervals Lateral step up and over on bosu: 2 min- not today SLS 30 seconds x 3 R LE bosu  SLS on airex cushion 3x 30 seconds- with ball toss various angles- not today SLS 5# ball toss to trampoline: 30 seconds x 3- not today SLS 3# ball toss diagonal with PT: 3x30 sec ea direction  Pt demonstrates reciprocal gait on stairs: 3 laps, good/fair control in R knee, without UE use on railing  Pt demonstrates * sign: deep split  squat simulating to pick something up (ie: dog food/crate/dog) with R LE behind and L in front recreates her pain, lacks strength to push back up from there  HEP instruction (see below)  Self care/pt education/ brace instructions- d/c brace PATIENT EDUCATION:  Education details: PT POC/goals, HEP (see below), exercise technique Person educated: Patient Education method: Explanation, Verbal cues, and Handouts Education comprehension: verbalized understanding, returned demonstration, and needs further education  HOME EXERCISE PROGRAM: Access Code: [email or web address] Added step up sequence (see medbridge) ASSESSMENT:  CLINICAL IMPRESSION: Patient tolerated moving into increased depth of split squat with R LE behind today; this is progress.  Also tolerated progression of increased medial/lateral dynamic knee movements without c/o pain or instability with low level plyometric lunge activity today.  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   She should continue to benefit from skilled PT to address impairments listed below and facilitate return to PLOF after her R knee injury on 10/30/23 where she sustained R knee PCL injury and R MCL injury.  Plan to continue addressing impairments with LE strength and power deficits through full ROM and single leg CKC dynamic stability during this next phase of rehab.    OBJECTIVE IMPAIRMENTS: Abnormal gait, decreased activity tolerance, decreased balance, decreased endurance, decreased mobility, difficulty walking, decreased ROM, decreased strength, impaired perceived functional ability, and pain.   ACTIVITY LIMITATIONS: carrying, lifting, bending, standing, squatting, stairs, transfers, locomotion level, and caring for others  PARTICIPATION LIMITATIONS: cleaning, community activity, occupation, and yard work  PERSONAL FACTORS:  Past/current experiences and Time since onset of injury/illness/exacerbation are also affecting patient's functional outcome.   REHAB POTENTIAL: Good  CLINICAL DECISION MAKING: Stable/uncomplicated  EVALUATION COMPLEXITY: Low   GOALS: Goals reviewed with patient? 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  Improve R LE strength 1 MMT grade for knee extension to facilitate improved ability to walk/run on uneven surfaces for dog training responsibilities Baseline: 4/5; 4+/5 01/11/24 Goal status: In progress  3.  Pt will tolerate being able to walk/run/pivot/change directions quickly on uneven surfaces x 20 minutes for professional dog showing responsibilities without being limited by R knee pain/instability Baseline: unable; wearing knee brace during the day;  Goal status:  In progress   PLAN:  PT FREQUENCY: 2x/week  PT DURATION: 6 weeks  PLANNED INTERVENTIONS: 97110-Therapeutic exercises, 97530- Therapeutic activity, 97112- Neuromuscular re-education, 97535- Self Care, 62952- Manual therapy, [phone]- Gait training, Patient/Family education, Taping, Dry Needling, and Cryotherapy  PLAN FOR NEXT SESSION: LE strengthening, quad mm activation, CKC proprioception; progress note/recert done today; recommend continued PT x 6 weeks  Lucrecia Sables, PT, DPT, OCS   Yaniah Thiemann E Navreet Bolda, PT 01/27/2024, 4:05 PM

## 2024-02-01 ENCOUNTER — Ambulatory Visit

## 2024-02-01 DIAGNOSIS — M25561 Pain in right knee: Secondary | ICD-10-CM | POA: Diagnosis not present

## 2024-02-01 DIAGNOSIS — R29898 Other symptoms and signs involving the musculoskeletal system: Secondary | ICD-10-CM | POA: Diagnosis not present

## 2024-02-01 DIAGNOSIS — S83411D Sprain of medial collateral ligament of right knee, subsequent encounter: Secondary | ICD-10-CM

## 2024-02-01 DIAGNOSIS — M79604 Pain in right leg: Secondary | ICD-10-CM | POA: Diagnosis not present

## 2024-02-01 DIAGNOSIS — G8929 Other chronic pain: Secondary | ICD-10-CM | POA: Diagnosis not present

## 2024-02-01 DIAGNOSIS — S8991XD Unspecified injury of right lower leg, subsequent encounter: Secondary | ICD-10-CM

## 2024-02-01 NOTE — Therapy (Signed)
 OUTPATIENT PHYSICAL THERAPY LOWER EXTREMITY TREATMENT/Recert through 02/21/24   Patient Name: Janet Brock MRN: [id number] DOB:1955-11-04, 68 y.o., female Today's Date: 02/01/2024  END OF SESSION:  PT End of Session - 02/01/24 1034     Visit Number 13    Number of Visits 22    Date for PT Re-Evaluation 02/21/24    Authorization Type 2x/week x 6 weeks (re-cert 4/0/98), Medicare POC progresse noted needed at visit #20    PT Start Time 1030    PT Stop Time 1115    PT Time Calculation (min) 45 min    Activity Tolerance Patient tolerated treatment well    Behavior During Therapy WFL for tasks assessed/performed             Past Medical History:  Diagnosis Date   Anemia    past hx - off Iron   Heart murmur    History of varicella    Hypothyroidism    synthroid   ? hyperthyroid when young and thin and then  dx hypo dx at clinic at work    Migraines    pre menopausal  hx of vicodin  rescue    Past Surgical History:  Procedure Laterality Date   ABLATION     ? year    btl     CATARACT EXTRACTION Bilateral    12/06/2021, 12/20/2021   COLONOSCOPY     approx 2 years ago   HYSTEROSCOPY  05/08/2011   Procedure: HYSTEROSCOPY WITH HYDROTHERMAL ABLATION;  Surgeon: Hamp Levine;  Location: [facility];  Service: Gynecology;  Laterality: N/A;   svd       x 2   WISDOM TOOTH EXTRACTION     Patient Active Problem List   Diagnosis Date Noted   Breast lump 05/19/2018   Greater trochanteric bursitis of right hip 11/14/2016   Hypothyroidism     PCP: Dr. Ethel Henry  REFERRING PROVIDER: Dr. Alease Hunter, MD  REFERRING DIAG: [phone] (ICD-10-CM) - Chronic pain of right knee   THERAPY DIAG:  Chronic pain of right knee  Sprain of medial collateral ligament of right knee, subsequent encounter  Injury of posterior cruciate ligament of right knee, subsequent encounter  Rationale for Evaluation and Treatment: Rehabilitation  ONSET DATE: 10/30/23  SUBJECTIVE:   SUBJECTIVE STATEMENT: Pt  presents with recent R knee injury- was out training her dogs and leash got wrapped around her ankle as a dog went running and pulled her legs out from under her and sh sustained a big fall.  Initially the knee was pretty bruised/swollen after the injury.  Went to see sports medicine physician, had imaging done (see report below).  Has PCL injury and MCL injury.  Not pursuing surgical intervention, would like to work on PT after this injury.  PERTINENT HISTORY: Pt is wearing a knee brace with stabilizers on both sides; overall doing better- pain and swelling have improved She had an injection before going to [place] and was able to do her trip last week (wore brace) She is wearing the brace during the day, not sleeping in it  When wearing the brace she notices she can do most of her activities during the day, but she has not been doing anything without the brace- some discomfort with prolonged use  Current activity level: doing some walking, no turns, no show activities with her dog, wearing knee brace every day  Had a hip injection 2 months ago for her R hip and was doing well with her hip before her knee  injury  Managing sx with Tylenol  at night and NSAIDs during the day   PAIN:  Are you having pain? Yes, achy feeling; 0/10-2/10    PRECAUTIONS: None  RED FLAGS: None   WEIGHT BEARING RESTRICTIONS: No  FALLS:  Has patient fallen in last 6 months? 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 Alease Hunter; not scheduled for surgery  OBJECTIVE:  Note: Objective measures were completed at Evaluation unless otherwise noted.  DIAGNOSTIC FINDINGS: MRI of knee 11/15/22 IMPRESSION: 1. High-grade, probable complete midsubstance tear of the posterior cruciate ligament. 2. Partial tear of the medial collateral ligament. 3. The menisci, anterior cruciate ligament and lateral collateral ligament complex are intact. 4. Bone contusions of the lateral femoral condyle and medial tibial plateau. No definite cortical fracture or lipohemarthrosis identified. Small nonspecific knee joint effusion.  PATIENT SURVEYS:  LEFS 56  COGNITION: Overall cognitive status: Within functional limits for tasks assessed     SENSATION: WFL  EDEMA:  Circumferential: no significant difference between R and L at proximal patella, distal patella, and mid gastroc   POSTURE: Pt able to stand full weight bearing on R LE without her brace on and no pain  PALPATION: TTP along R MCL, (+) swelling noted  LOWER EXTREMITY ROM:  Active ROM Right eval Left eval  Hip flexion    Hip extension    Hip abduction    Hip adduction    Hip internal rotation    Hip external rotation    Knee flexion 128 140  Knee extension 2 hyperext 5 hyperext  Ankle dorsiflexion    Ankle plantarflexion    Ankle inversion    Ankle eversion     (Blank rows = not tested)  LOWER EXTREMITY MMT:  MMT Right eval Left eval  Hip flexion    Hip extension 4   Hip abduction 4   Hip adduction    Hip internal rotation    Hip external rotation    Knee flexion 4   Knee extension Impaired R quad set Normal L quad set  Ankle dorsiflexion    Ankle  plantarflexion    Ankle inversion    Ankle eversion     (Blank rows = not tested)  LOWER EXTREMITY SPECIAL TESTS:  (+) laxity with posterior drawer R knee (+) laxity and pain with valgus stress test MCL at 20 deg knee flex  FUNCTIONAL TESTS:  Stair assessment: pt able to ascend/descend 4 stairs in clinic with b/l railing and no brace on, used reciprocal gait pattern and was safe, reports no pain; focused on task without distractions and was safe with slow speed and not carrying anything extra in hands so she could use the railing b/l    Single  leg balance: pt able to stand full WB on R LE today without brace and 0/10 pain, improved sway of trunk noted vs L SL balance, 30 second R, 45 second L GAIT: Pt amb wearing short hinged knee brace into/out of clinic, no crutches, able to achieve good R heel strike with brace on                                                                                                                               TREATMENT DATE: 02/01/24  Subjective: Pt has been working hard on her HEP; her quad feels very sore today.  Pain 0/10 Objective:  (+) MTP L vastus lateralis today, + tightness R Ely test vs L for quad tightness  Manual Therapy: STM R vastus lateralis MTP focus Rolling stick x 5 min vastus lateralis Manual prone quad stretch: 3x 45 seconds Manual supine quad stretch: 3x45 sec  Therapeutic exercises: Nustep: seat #11 x 10 minutes, level 4-5 B/l squat to box: 3x10, (2 sets with 9# held in front)- not today Lateral walking with mini squat 2x15 ft- blue tb at ankles- not today Walk in CCW direction with quick reaction to change of direction via external moving target- not today Wall squats: with white physioball behind back- not today Lateral lunge: praticed technique, PT verbal/tactile cues 3x10 ea direction, practiced 5 second holds Hamstring stool walk- hallway 4 laps- not today Standing quad stretch with foot on stool: 1 min Split squat with  TRX 2x15, to simulate movement she needs to do when stacking a dog in her competitions- not today TRX mini squat jumps: 2x10  8 inch step: x15 ea direction Step sequence: 8 inch: 15x front, 15 lateral step down, 15x forward step down, 2 rounds- not today, discussed how to progress for HEP with holding a 8-10 lb weight Dead lifts: 2x 9# weights 2x10 Total gym: holding 10 lb medicine ball x10 b/l, 3x10 R LE only Forward walking lunges on grass: 3 laps x 20 ft- not today Walking forward lunge with trunk twist: x3 laps- not today Walking forward lunges with mini front leg hops R/L, 3 laps x 20 ft- not today  Therapeutic activities: not today Front step up to SLS R LE holding 6# weight x20 Front step up onto [employer] jog on straight/flat surface 6 laps in hallway- not today Lateral shuffles R/L x 3 laps in hallway- not today Agility ladder on floor: lateral R and L forward/back in,in,out,out- 4 laps; diagonal forward lateral alternating 4 laps- practiced weight acceptance and push off from R LE with low level plyometric activities    Neuro re-ed: not today Bosu squat holds: 5 second holds x 20, 2 sets (standing on flat surface) Bosu stand on soft part mini squat with chest pass at trampoline 3x 1 min intervals Lateral step up and over on bosu: 2 min- not today SLS 30 seconds x 3 R LE bosu  SLS on  airex cushion 3x 30 seconds- with ball toss various angles- not today SLS 5# ball toss to trampoline: 30 seconds x 3- not today SLS 3# ball toss diagonal with PT: 3x30 sec ea direction  Pt demonstrates reciprocal gait on stairs: 3 laps, good/fair control in R knee, without UE use on railing  Pt demonstrates * sign: deep split squat simulating to pick something up (ie: dog food/crate/dog) with R LE behind and L in front recreates her pain, lacks strength to push back up from there  HEP instruction (see below)  Self care/pt education/ brace instructions- d/c brace PATIENT  EDUCATION:  Education details: PT POC/goals, HEP (see below), exercise technique Person educated: Patient Education method: Explanation, Verbal cues, and Handouts Education comprehension: verbalized understanding, returned demonstration, and needs further education  HOME EXERCISE PROGRAM: Access Code: [email or web address] Added step up sequence (see medbridge) ASSESSMENT:  CLINICAL IMPRESSION: Patient tolerated manual therapy to address MTP in vastus lateralis well today; she reports no pain in L quads with squat after manual tx.  Able to progress plyometric activities today without any report of knee pain or instability. 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   She should continue to benefit from skilled PT to address impairments listed below and facilitate return to PLOF after her R knee injury on 10/30/23 where she sustained R knee PCL injury and R MCL injury.  Plan to continue addressing impairments with LE strength and power deficits through full ROM and single leg CKC dynamic stability during this next phase of rehab.    OBJECTIVE IMPAIRMENTS: Abnormal gait, decreased activity tolerance, decreased balance, decreased endurance, decreased mobility, difficulty walking, decreased ROM, decreased strength, impaired perceived functional ability, and pain.   ACTIVITY LIMITATIONS: carrying, lifting, bending, standing, squatting, stairs, transfers, locomotion level, and caring for others  PARTICIPATION LIMITATIONS: cleaning, community activity, occupation, and yard work  PERSONAL FACTORS: Past/current experiences and Time since onset of injury/illness/exacerbation are also affecting patient's functional outcome.   REHAB POTENTIAL: Good  CLINICAL DECISION MAKING: Stable/uncomplicated  EVALUATION COMPLEXITY: Low   GOALS: Goals reviewed with patient? 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  Improve R LE strength 1 MMT grade for knee extension to facilitate improved ability to walk/run on uneven surfaces for dog training responsibilities Baseline: 4/5; 4+/5 01/11/24 Goal status: In progress  3.  Pt will tolerate being able to walk/run/pivot/change directions quickly on uneven surfaces x 20 minutes for professional dog showing responsibilities without being limited by R knee pain/instability Baseline: unable; wearing knee brace during the day;  Goal status: In progress   PLAN:  PT FREQUENCY: 2x/week  PT DURATION: 6 weeks  PLANNED INTERVENTIONS: 97110-Therapeutic exercises, 97530- Therapeutic activity, [id number]- Neuromuscular re-education, 97535- Self Care, 95621- Manual therapy, [id number]- Gait training, Patient/Family education, Taping, Dry Needling, and Cryotherapy  PLAN FOR NEXT SESSION: LE strengthening, quad mm activation, CKC proprioception; progress note/recert done today; recommend continued PT x 6  weeks  Lucrecia Sables, PT, DPT, OCS   Kin Penner, PT 02/01/2024, 10:35 AM

## 2024-02-03 ENCOUNTER — Ambulatory Visit

## 2024-02-03 DIAGNOSIS — S8991XD Unspecified injury of right lower leg, subsequent encounter: Secondary | ICD-10-CM

## 2024-02-03 DIAGNOSIS — G8929 Other chronic pain: Secondary | ICD-10-CM

## 2024-02-03 DIAGNOSIS — S83411D Sprain of medial collateral ligament of right knee, subsequent encounter: Secondary | ICD-10-CM | POA: Diagnosis not present

## 2024-02-03 DIAGNOSIS — M79604 Pain in right leg: Secondary | ICD-10-CM | POA: Diagnosis not present

## 2024-02-03 DIAGNOSIS — R29898 Other symptoms and signs involving the musculoskeletal system: Secondary | ICD-10-CM | POA: Diagnosis not present

## 2024-02-03 DIAGNOSIS — M25561 Pain in right knee: Secondary | ICD-10-CM | POA: Diagnosis not present

## 2024-02-03 NOTE — Therapy (Signed)
 OUTPATIENT PHYSICAL THERAPY LOWER EXTREMITY TREATMENT/Recert through 02/21/24   Patient Name: Janet Brock MRN: [id number] DOB:11-05-1955, 68 y.o., female Today's Date: 02/03/2024  END OF SESSION:  PT End of Session - 02/03/24 0948     Visit Number 14    Number of Visits 22    Date for PT Re-Evaluation 02/21/24    Authorization Type 2x/week x 6 weeks (re-cert 04/27/56), Medicare POC progresse noted needed at visit #20    PT Start Time 0948    PT Stop Time 1030    PT Time Calculation (min) 42 min    Activity Tolerance Patient tolerated treatment well    Behavior During Therapy WFL for tasks assessed/performed             Past Medical History:  Diagnosis Date   Anemia    past hx - off Iron   Heart murmur    History of varicella    Hypothyroidism    synthroid   ? hyperthyroid when young and thin and then  dx hypo dx at clinic at work    Migraines    pre menopausal  hx of vicodin  rescue    Past Surgical History:  Procedure Laterality Date   ABLATION     ? year    btl     CATARACT EXTRACTION Bilateral    12/06/2021, 12/20/2021   COLONOSCOPY     approx 2 years ago   HYSTEROSCOPY  05/08/2011   Procedure: HYSTEROSCOPY WITH HYDROTHERMAL ABLATION;  Surgeon: Hamp Levine;  Location: [facility];  Service: Gynecology;  Laterality: N/A;   svd       x 2   WISDOM TOOTH EXTRACTION     Patient Active Problem List   Diagnosis Date Noted   Breast lump 05/19/2018   Greater trochanteric bursitis of right hip 11/14/2016   Hypothyroidism     PCP: Dr. Ethel Henry  REFERRING PROVIDER: Dr. Alease Hunter, MD  REFERRING DIAG: [phone] (ICD-10-CM) - Chronic pain of right knee   THERAPY DIAG:  Chronic pain of right knee  Sprain of medial collateral ligament of right knee, subsequent encounter  Injury of posterior cruciate ligament of right knee, subsequent encounter  Pain in right leg  Rationale for Evaluation and Treatment: Rehabilitation  ONSET DATE: 10/30/23  SUBJECTIVE:    SUBJECTIVE STATEMENT: Pt presents with recent R knee injury- was out training her dogs and leash got wrapped around her ankle as a dog went running and pulled her legs out from under her and sh sustained a big fall.  Initially the knee was pretty bruised/swollen after the injury.  Went to see sports medicine physician, had imaging done (see report below).  Has PCL injury and MCL injury.  Not pursuing surgical intervention, would like to work on PT after this injury.  PERTINENT HISTORY: Pt is wearing a knee brace with stabilizers on both sides; overall doing better- pain and swelling have improved She had an injection before going to [place] and was able to do her trip last week (wore brace) She is wearing the brace during the day, not sleeping in it  When wearing the brace she notices she can do most of her activities during the day, but she has not been doing anything without the brace- some discomfort with prolonged use  Current activity level: doing some walking, no turns, no show activities with her dog, wearing knee brace every day  Had a hip injection 2 months ago for her R hip and was doing well with  her hip before her knee injury  Managing sx with Tylenol  at night and NSAIDs during the day   PAIN:  Are you having pain? Yes, achy feeling; 0/10-2/10    PRECAUTIONS: None  RED FLAGS: None   WEIGHT BEARING RESTRICTIONS: No  FALLS:  Has patient fallen in last 6 months? 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 Alease Hunter; not scheduled for surgery  OBJECTIVE:  Note: Objective measures were completed at Evaluation unless otherwise noted.  DIAGNOSTIC FINDINGS: MRI of knee 11/15/22 IMPRESSION: 1. High-grade, probable complete midsubstance tear of the posterior cruciate ligament. 2. Partial tear of the medial collateral ligament. 3. The menisci, anterior cruciate ligament and lateral collateral ligament complex are intact. 4. Bone contusions of the lateral femoral condyle and medial tibial plateau. No definite cortical fracture or lipohemarthrosis identified. Small nonspecific knee joint effusion.  PATIENT SURVEYS:  LEFS 56  COGNITION: Overall cognitive status: Within functional limits for tasks assessed     SENSATION: WFL  EDEMA:  Circumferential: no significant difference between R and L at proximal patella, distal patella, and mid gastroc   POSTURE: Pt able to stand full weight bearing on R LE without her brace on and no pain  PALPATION: TTP along R MCL, (+) swelling noted  LOWER EXTREMITY ROM:  Active ROM Right eval Left eval  Hip flexion    Hip extension    Hip abduction    Hip adduction    Hip internal rotation    Hip external rotation    Knee flexion 128 140  Knee extension 2 hyperext 5 hyperext  Ankle dorsiflexion    Ankle plantarflexion    Ankle inversion    Ankle eversion     (Blank rows = not tested)  LOWER EXTREMITY MMT:  MMT Right eval Left eval  Hip flexion    Hip extension 4   Hip abduction 4   Hip adduction    Hip internal rotation    Hip external rotation    Knee flexion 4   Knee extension Impaired R quad set Normal L quad set   Ankle dorsiflexion    Ankle plantarflexion    Ankle inversion    Ankle eversion     (Blank rows = not tested)  LOWER EXTREMITY SPECIAL TESTS:  (+) laxity with posterior drawer R knee (+) laxity and pain with valgus stress test MCL at 20 deg knee flex  FUNCTIONAL TESTS:  Stair assessment: pt able to ascend/descend 4 stairs in clinic with b/l railing and no brace on, used reciprocal gait pattern and was safe, reports no pain; focused on task without distractions and was safe with slow speed and not carrying anything extra in hands so she could use the railing  b/l    Single leg balance: pt able to stand full WB on R LE today without brace and 0/10 pain, improved sway of trunk noted vs L SL balance, 30 second R, 45 second L GAIT: Pt amb wearing short hinged knee brace into/out of clinic, no crutches, able to achieve good R heel strike with brace on                                                                                                                               TREATMENT DATE: 02/03/24  Subjective: Pt feels like the stretching was really helpful last time.  It helped with some of her quad muscle soreness.    Pain 0/10 Objective:  (+) MTP L vastus lateralis today, + tightness R Ely test vs L for quad tightness  Manual Therapy: STM R vastus lateralis MTP focus Rolling stick x 5 min vastus lateralis Manual prone quad stretch: 3x 45 seconds Manual supine quad stretch: 3x45 sec  Therapeutic exercises: Nustep: seat #0 x 10 minutes, level 4 x 3 min, level 5 x 3 min, level 6 x 4 min Squat with L foot up on step: for increased R LE WB 3x8 B/l squat to box: 3x10, (2 sets with 9# held in front)- not today Lateral walking with mini squat 2x15 ft- blue tb at ankles- not today Walk in CCW direction with quick reaction to change of direction via external moving target- not today Wall squats: with white physioball behind back- not today Lateral lunge: praticed technique, PT  verbal/tactile cues 3x10 ea direction, practiced 5 second holds Hamstring stool walk- hallway 4 laps- not today Standing quad stretch with foot on stool: 1 min Split squat with TRX 2x15, to simulate movement she needs to do when stacking a dog in her competitions- not today TRX mini squat jumps: 2x10  8 inch step: x15 ea direction Step sequence: 8 inch: 15x front, 15 lateral step down, 15x forward step down, 2 rounds- not today, discussed how to progress for HEP with holding a 8-10 lb weight Dead lifts: 2x 9# weights 2x10 Total gym: holding 10 lb medicine ball x10 b/l, 3x10 R LE only Forward walking lunges on grass: 3 laps x 20 ft- not today Walking forward lunge with trunk twist: x3 laps Walking forward lunges with mini front leg hops R/L, 3 laps x 20 ft  Therapeutic activities:  Front step up to SLS R LE holding 6# weight x20 Front step up onto [employer] jog on straight/flat surface 6 laps in hallway- not today Lateral shuffles R/L x 3 laps in hallway- not today Agility ladder on floor: lateral R and L forward/back in,in,out,out- 4 laps; diagonal forward lateral alternating 4 laps- practiced weight acceptance and push off from R LE with low level plyometric activities- lateral "speed skaters" today with PT verbal/tactile cues for CKC weight acceptance/pelvic neutral position on R LE. 5 intervals x 45 seconds   Neuro  re-ed: not today Bosu squat holds: 5 second holds x 20, 2 sets (standing on flat surface) Bosu stand on soft part mini squat with chest pass at trampoline 3x 1 min intervals Lateral step up and over on bosu: 2 min- not today SLS 30 seconds x 3 R LE bosu  SLS on airex cushion 3x 30 seconds- with ball toss various angles- not today SLS 5# ball toss to trampoline: 30 seconds x 3- not today SLS 3# ball toss diagonal with PT: 3x30 sec ea direction  Pt demonstrates reciprocal gait on stairs: 3 laps, good/fair control in R knee, without UE use on railing  Pt  demonstrates * sign: deep split squat simulating to pick something up (ie: dog food/crate/dog) with R LE behind and L in front recreates her pain, lacks strength to push back up from there  HEP instruction (see below)  Self care/pt education/ brace instructions- d/c brace PATIENT EDUCATION:  Education details: PT POC/goals, HEP (see below), exercise technique Person educated: Patient Education method: Explanation, Verbal cues, and Handouts Education comprehension: verbalized understanding, returned demonstration, and needs further education  HOME EXERCISE PROGRAM: Access Code: [email or web address] Added step up sequence (see medbridge) ASSESSMENT:  CLINICAL IMPRESSION: Continued to address MTP in R vastus lateralis today with manual therapy.  Pt tolerated well and improved soft tissue mobility palpated at end of tx.  Able to tolerate progression of squat towards more of a single leg R LE emphasis today.  Lacks symmetrical LE CKC stability with speed skaters- lands more stiffly on R LE, but no pain noted.  Will continue to work on this.  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   She should continue to benefit from skilled PT to address impairments listed below and facilitate return to PLOF after her R knee injury on 10/30/23 where she sustained R knee PCL injury and R MCL injury.  Plan to continue addressing impairments with LE strength and power deficits through full ROM and single leg CKC dynamic stability during this next phase of rehab.    OBJECTIVE IMPAIRMENTS: Abnormal gait, decreased activity tolerance, decreased balance, decreased endurance, decreased mobility, difficulty walking, decreased ROM, decreased strength, impaired perceived functional ability, and pain.   ACTIVITY LIMITATIONS: carrying, lifting, bending, standing, squatting, stairs, transfers, locomotion  level, and caring for others  PARTICIPATION LIMITATIONS: cleaning, community activity, occupation, and yard work  PERSONAL FACTORS: Past/current experiences and Time since onset of injury/illness/exacerbation are also affecting patient's functional outcome.   REHAB POTENTIAL: Good  CLINICAL DECISION MAKING: Stable/uncomplicated  EVALUATION COMPLEXITY: Low   GOALS: Goals reviewed with patient? 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  Improve R LE strength 1 MMT grade for knee extension to facilitate improved ability to walk/run on uneven surfaces for dog training responsibilities Baseline: 4/5; 4+/5 01/11/24 Goal status: In progress  3.  Pt will tolerate being able to walk/run/pivot/change directions quickly on uneven surfaces x 20  minutes for professional dog showing responsibilities without being limited by R knee pain/instability Baseline: unable; wearing knee brace during the day;  Goal status: In progress   PLAN:  PT FREQUENCY: 2x/week  PT DURATION: 6 weeks  PLANNED INTERVENTIONS: 97110-Therapeutic exercises, 97530- Therapeutic activity, 97112- Neuromuscular re-education, 97535- Self Care, 66440- Manual therapy, [phone]- Gait training, Patient/Family education, Taping, Dry Needling, and Cryotherapy  PLAN FOR NEXT SESSION: LE strengthening, quad mm activation, CKC proprioception; progress note/recert done today; recommend continued PT x 6 weeks  Lucrecia Sables, PT, DPT, OCS   Evlyn Amason E Nekoda Chock, PT 02/03/2024, 9:49 AM

## 2024-02-08 ENCOUNTER — Ambulatory Visit

## 2024-02-08 DIAGNOSIS — M25561 Pain in right knee: Secondary | ICD-10-CM | POA: Diagnosis not present

## 2024-02-08 DIAGNOSIS — R29898 Other symptoms and signs involving the musculoskeletal system: Secondary | ICD-10-CM | POA: Diagnosis not present

## 2024-02-08 DIAGNOSIS — S8991XD Unspecified injury of right lower leg, subsequent encounter: Secondary | ICD-10-CM

## 2024-02-08 DIAGNOSIS — M79604 Pain in right leg: Secondary | ICD-10-CM

## 2024-02-08 DIAGNOSIS — G8929 Other chronic pain: Secondary | ICD-10-CM | POA: Diagnosis not present

## 2024-02-08 DIAGNOSIS — S83411D Sprain of medial collateral ligament of right knee, subsequent encounter: Secondary | ICD-10-CM

## 2024-02-08 NOTE — Therapy (Signed)
 OUTPATIENT PHYSICAL THERAPY LOWER EXTREMITY TREATMENT/Recert through 02/21/24   Patient Name: Janet Brock MRN: [id number] DOB:08-23-1956, 68 y.o., female Today's Date: 02/08/2024  END OF SESSION:  PT End of Session - 02/08/24 1037     Visit Number 15    Number of Visits 22    Date for PT Re-Evaluation 02/21/24    Authorization Type 2x/week x 6 weeks (re-cert 4/0/98), Medicare POC progresse noted needed at visit #20    PT Start Time 1030    PT Stop Time 1115    PT Time Calculation (min) 45 min    Activity Tolerance Patient tolerated treatment well    Behavior During Therapy WFL for tasks assessed/performed             Past Medical History:  Diagnosis Date   Anemia    past hx - off Iron   Heart murmur    History of varicella    Hypothyroidism    synthroid   ? hyperthyroid when young and thin and then  dx hypo dx at clinic at work    Migraines    pre menopausal  hx of vicodin  rescue    Past Surgical History:  Procedure Laterality Date   ABLATION     ? year    btl     CATARACT EXTRACTION Bilateral    12/06/2021, 12/20/2021   COLONOSCOPY     approx 2 years ago   HYSTEROSCOPY  05/08/2011   Procedure: HYSTEROSCOPY WITH HYDROTHERMAL ABLATION;  Surgeon: Hamp Levine;  Location: [facility];  Service: Gynecology;  Laterality: N/A;   svd       x 2   WISDOM TOOTH EXTRACTION     Patient Active Problem List   Diagnosis Date Noted   Breast lump 05/19/2018   Greater trochanteric bursitis of right hip 11/14/2016   Hypothyroidism     PCP: Dr. Ethel Henry  REFERRING PROVIDER: Dr. Alease Hunter, MD  REFERRING DIAG: [phone] (ICD-10-CM) - Chronic pain of right knee   THERAPY DIAG:  Chronic pain of right knee  Sprain of medial collateral ligament of right knee, subsequent encounter  Injury of posterior cruciate ligament of right knee, subsequent encounter  Pain in right leg  Rationale for Evaluation and Treatment: Rehabilitation  ONSET DATE: 10/30/23  SUBJECTIVE:    SUBJECTIVE STATEMENT: Pt presents with recent R knee injury- was out training her dogs and leash got wrapped around her ankle as a dog went running and pulled her legs out from under her and sh sustained a big fall.  Initially the knee was pretty bruised/swollen after the injury.  Went to see sports medicine physician, had imaging done (see report below).  Has PCL injury and MCL injury.  Not pursuing surgical intervention, would like to work on PT after this injury.  PERTINENT HISTORY: Pt is wearing a knee brace with stabilizers on both sides; overall doing better- pain and swelling have improved She had an injection before going to [place] and was able to do her trip last week (wore brace) She is wearing the brace during the day, not sleeping in it  When wearing the brace she notices she can do most of her activities during the day, but she has not been doing anything without the brace- some discomfort with prolonged use  Current activity level: doing some walking, no turns, no show activities with her dog, wearing knee brace every day  Had a hip injection 2 months ago for her R hip and was doing well with  her hip before her knee injury  Managing sx with Tylenol  at night and NSAIDs during the day   PAIN:  Are you having pain? Yes, achy feeling; 0/10-2/10    PRECAUTIONS: None  RED FLAGS: None   WEIGHT BEARING RESTRICTIONS: No  FALLS:  Has patient fallen in last 6 months? 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 Alease Hunter; not scheduled for surgery  OBJECTIVE:  Note: Objective measures were completed at Evaluation unless otherwise noted.  DIAGNOSTIC FINDINGS: MRI of knee 11/15/22 IMPRESSION: 1. High-grade, probable complete midsubstance tear of the posterior cruciate ligament. 2. Partial tear of the medial collateral ligament. 3. The menisci, anterior cruciate ligament and lateral collateral ligament complex are intact. 4. Bone contusions of the lateral femoral condyle and medial tibial plateau. No definite cortical fracture or lipohemarthrosis identified. Small nonspecific knee joint effusion.  PATIENT SURVEYS:  LEFS 56  COGNITION: Overall cognitive status: Within functional limits for tasks assessed     SENSATION: WFL  EDEMA:  Circumferential: no significant difference between R and L at proximal patella, distal patella, and mid gastroc   POSTURE: Pt able to stand full weight bearing on R LE without her brace on and no pain  PALPATION: TTP along R MCL, (+) swelling noted  LOWER EXTREMITY ROM:  Active ROM Right eval Left eval  Hip flexion    Hip extension    Hip abduction    Hip adduction    Hip internal rotation    Hip external rotation    Knee flexion 128 140  Knee extension 2 hyperext 5 hyperext  Ankle dorsiflexion    Ankle plantarflexion    Ankle inversion    Ankle eversion     (Blank rows = not tested)  LOWER EXTREMITY MMT:  MMT Right eval Left eval  Hip flexion    Hip extension 4   Hip abduction 4   Hip adduction    Hip internal rotation    Hip external rotation    Knee flexion 4   Knee extension Impaired R quad set Normal L quad set   Ankle dorsiflexion    Ankle plantarflexion    Ankle inversion    Ankle eversion     (Blank rows = not tested)  LOWER EXTREMITY SPECIAL TESTS:  (+) laxity with posterior drawer R knee (+) laxity and pain with valgus stress test MCL at 20 deg knee flex  FUNCTIONAL TESTS:  Stair assessment: pt able to ascend/descend 4 stairs in clinic with b/l railing and no brace on, used reciprocal gait pattern and was safe, reports no pain; focused on task without distractions and was safe with slow speed and not carrying anything extra in hands so she could use the railing  b/l    Single leg balance: pt able to stand full WB on R LE today without brace and 0/10 pain, improved sway of trunk noted vs L SL balance, 30 second R, 45 second L GAIT: Pt amb wearing short hinged knee brace into/out of clinic, no crutches, able to achieve good R heel strike with brace on                                                                                                                               TREATMENT DATE: 02/08/24  Subjective: Pt feels like she is making progress with PT; has a dog show later this week.  She was able to do some dog training work on uneven surfaces and incline/decline surfaces and this felt good with her knee.    Pain 0/10 Objective:  (+) MTP L vastus lateralis today, + tightness R Ely test vs L for quad tightness- improving  Manual Therapy: STM R vastus lateralis MTP focus Rolling stick x 5 min vastus lateralis Manual prone quad stretch: 3x 45 seconds Manual supine quad stretch: 3x45 sec  Therapeutic exercises: Nustep: seat #0 x 10 minutes, level 4 x 3 min, level 5 x 3 min, level 6 x 4 min Squat with L foot up on step: for increased R LE WB 3x8 B/l squat to box: 3x10, (2 sets with 9# held in front)- not today Lateral walking with mini squat 2x15 ft- blue tb at ankles- not today Walk in CCW direction with quick reaction to change of direction via external moving target- not today Wall  squats: with white physioball behind back- not today Lateral lunge: praticed technique, PT verbal/tactile cues 3x10 ea direction, practiced 5 second holds Hamstring stool walk- hallway 4 laps- not today Standing quad stretch with foot on stool: 1 min Split squat with TRX 2x15, to simulate movement she needs to do when stacking a dog in her competitions- not today TRX mini squat jumps: 2x10  8 inch step: x15 ea direction Step sequence: 8 inch: 15x front, 15 lateral step down, 15x forward step down, 2 rounds- not today, discussed how to progress for HEP with holding a 8-10 lb weight Dead lifts: 2x 9# weights 2x10 Total gym: holding 10 lb medicine ball x10 b/l, 3x10 R LE only Forward walking lunges on grass: 3 laps x 20 ft- not today Walking forward lunge with trunk twist: x3 laps, holding 5 lb med ball Walking forward lunges with mini front leg hops R/L, 3 laps x 20 ft holding 5 lb med ball  Therapeutic activities:  Front step up to SLS R LE holding 6# weight x20 Front step up onto [employer] jog on straight/flat surface 6 laps in hallway- not today Lateral shuffles R/L x 3 laps in hallway- not today Agility ladder on floor: hopscotch jump 2 feet land 1 in forward progression, 6 laps Lateral "speed skaters" today with PT verbal/tactile cues for CKC weight acceptance/pelvic  neutral position on R LE. 5 intervals x 45 seconds Single leg forward/reverse alternating bounding L LE to R LE then back to L, and then switched directions, 3 rounds x 45 seconds ea   Neuro re-ed: not today Bosu squat holds: 5 second holds x 20, 2 sets (standing on flat surface) Bosu stand on soft part mini squat with chest pass at trampoline 3x 1 min intervals Lateral step up and over on bosu: 2 min- not today SLS 30 seconds x 3 R LE bosu  SLS on airex cushion 3x 30 seconds- with ball toss various angles- not today SLS 5# ball toss to trampoline: 30 seconds x 3- not today SLS 3# ball toss diagonal with  PT: 3x30 sec ea direction  Pt demonstrates reciprocal gait on stairs: 3 laps, good/fair control in R knee, without UE use on railing- not today  Pt demonstrates * sign: deep split squat simulating to pick something up (ie: dog food/crate/dog) with R LE behind and L in front recreates her pain, lacks strength to push back up from there  HEP instruction (see below)  Self care/pt education/ brace instructions- d/c brace PATIENT EDUCATION:  Education details: PT POC/goals, HEP (see below), exercise technique Person educated: Patient Education method: Explanation, Verbal cues, and Handouts Education comprehension: verbalized understanding, returned demonstration, and needs further education  HOME EXERCISE PROGRAM: Access Code: [email or web address] Added step up sequence (see medbridge) ASSESSMENT:  CLINICAL IMPRESSION: Continued to address R LE power/strength deficits with progression of plyometric exercises today.  Pt able to push off and land on single R LE in forward and reverse and lateral directions today without c/o knee pain during low level plyometric activities. She should continue to benefit from skilled PT to address impairments listed below and facilitate return to PLOF after her R knee injury on 10/30/23 where she sustained R knee PCL injury and R MCL injury.  Plan to continue addressing impairments with LE strength and power deficits through full ROM and single leg CKC dynamic stability during this next phase of rehab.    OBJECTIVE IMPAIRMENTS: Abnormal gait, decreased activity tolerance, decreased balance, decreased endurance, decreased mobility, difficulty walking, decreased ROM, decreased strength, impaired perceived functional ability, and pain.   ACTIVITY LIMITATIONS: carrying, lifting, bending, standing, squatting, stairs, transfers, locomotion level, and caring for others  PARTICIPATION LIMITATIONS: cleaning, community activity, occupation, and yard work  PERSONAL FACTORS: Past/current  experiences and Time since onset of injury/illness/exacerbation are also affecting patient's functional outcome.   REHAB POTENTIAL: Good  CLINICAL DECISION MAKING: Stable/uncomplicated  EVALUATION COMPLEXITY: Low   GOALS: Goals reviewed with patient? 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  Improve R LE strength 1 MMT grade for knee extension to facilitate improved ability to walk/run on uneven surfaces for dog training responsibilities Baseline: 4/5; 4+/5 01/11/24 Goal status: In progress  3.  Pt will tolerate being able to walk/run/pivot/change directions quickly on uneven surfaces x 20 minutes for professional dog showing responsibilities without being limited by R knee pain/instability Baseline: unable; wearing knee brace during the  day;  Goal status: In progress   PLAN:  PT FREQUENCY: 2x/week  PT DURATION: 6 weeks  PLANNED INTERVENTIONS: 97110-Therapeutic exercises, 97530- Therapeutic activity, 97112- Neuromuscular re-education, 97535- Self Care, 72536- Manual therapy, [phone]- Gait training, Patient/Family education, Taping, Dry Needling, and Cryotherapy  PLAN FOR NEXT SESSION: LE strengthening, quad mm activation, CKC proprioception, agility/plyometrics  Lucrecia Sables, PT, DPT, OCS   Kin Penner, PT 02/08/2024, 10:39 AM

## 2024-02-10 ENCOUNTER — Ambulatory Visit

## 2024-02-10 DIAGNOSIS — S8991XD Unspecified injury of right lower leg, subsequent encounter: Secondary | ICD-10-CM

## 2024-02-10 DIAGNOSIS — R29898 Other symptoms and signs involving the musculoskeletal system: Secondary | ICD-10-CM | POA: Diagnosis not present

## 2024-02-10 DIAGNOSIS — S83411D Sprain of medial collateral ligament of right knee, subsequent encounter: Secondary | ICD-10-CM | POA: Diagnosis not present

## 2024-02-10 DIAGNOSIS — M79604 Pain in right leg: Secondary | ICD-10-CM | POA: Diagnosis not present

## 2024-02-10 DIAGNOSIS — M25561 Pain in right knee: Secondary | ICD-10-CM | POA: Diagnosis not present

## 2024-02-10 DIAGNOSIS — G8929 Other chronic pain: Secondary | ICD-10-CM | POA: Diagnosis not present

## 2024-02-10 NOTE — Therapy (Signed)
 OUTPATIENT PHYSICAL THERAPY LOWER EXTREMITY TREATMENT/Recert through 02/21/24   Patient Name: Janet Brock MRN: [id number] DOB:1956/06/05, 68 y.o., female Today's Date: 02/10/2024  END OF SESSION:  PT End of Session - 02/10/24 0948     Visit Number 16    Number of Visits 22    Date for PT Re-Evaluation 02/21/24    Authorization Type 2x/week x 6 weeks (re-cert 4/0/98), Medicare POC progresse noted needed at visit #20    PT Start Time 0945    PT Stop Time 1030    PT Time Calculation (min) 45 min    Activity Tolerance Patient tolerated treatment well    Behavior During Therapy WFL for tasks assessed/performed             Past Medical History:  Diagnosis Date   Anemia    past hx - off Iron   Heart murmur    History of varicella    Hypothyroidism    synthroid   ? hyperthyroid when young and thin and then  dx hypo dx at clinic at work    Migraines    pre menopausal  hx of vicodin  rescue    Past Surgical History:  Procedure Laterality Date   ABLATION     ? year    btl     CATARACT EXTRACTION Bilateral    12/06/2021, 12/20/2021   COLONOSCOPY     approx 2 years ago   HYSTEROSCOPY  05/08/2011   Procedure: HYSTEROSCOPY WITH HYDROTHERMAL ABLATION;  Surgeon: Hamp Levine;  Location: [facility];  Service: Gynecology;  Laterality: N/A;   svd       x 2   WISDOM TOOTH EXTRACTION     Patient Active Problem List   Diagnosis Date Noted   Breast lump 05/19/2018   Greater trochanteric bursitis of right hip 11/14/2016   Hypothyroidism     PCP: Dr. Ethel Henry  REFERRING PROVIDER: Dr. Alease Hunter, MD  REFERRING DIAG: [phone] (ICD-10-CM) - Chronic pain of right knee   THERAPY DIAG:  Chronic pain of right knee  Sprain of medial collateral ligament of right knee, subsequent encounter  Injury of posterior cruciate ligament of right knee, subsequent encounter  Weakness of right lower extremity  Rationale for Evaluation and Treatment: Rehabilitation  ONSET DATE:  10/30/23  SUBJECTIVE:   SUBJECTIVE STATEMENT: Pt presents with recent R knee injury- was out training her dogs and leash got wrapped around her ankle as a dog went running and pulled her legs out from under her and sh sustained a big fall.  Initially the knee was pretty bruised/swollen after the injury.  Went to see sports medicine physician, had imaging done (see report below).  Has PCL injury and MCL injury.  Not pursuing surgical intervention, would like to work on PT after this injury.  PERTINENT HISTORY: Pt is wearing a knee brace with stabilizers on both sides; overall doing better- pain and swelling have improved She had an injection before going to [place] and was able to do her trip last week (wore brace) She is wearing the brace during the day, not sleeping in it  When wearing the brace she notices she can do most of her activities during the day, but she has not been doing anything without the brace- some discomfort with prolonged use  Current activity level: doing some walking, no turns, no show activities with her dog, wearing knee brace every day  Had a hip injection 2 months ago for her R hip and was doing well  with her hip before her knee injury  Managing sx with Tylenol  at night and NSAIDs during the day   PAIN:  Are you having pain? Yes, achy feeling; 0/10-2/10    PRECAUTIONS: None  RED FLAGS: None   WEIGHT BEARING RESTRICTIONS: No  FALLS:  Has patient fallen in last 6 months? 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 Alease Hunter; not scheduled for surgery  OBJECTIVE:  Note: Objective measures were completed at Evaluation unless otherwise noted.  DIAGNOSTIC FINDINGS: MRI of knee 11/15/22 IMPRESSION: 1. High-grade, probable complete midsubstance tear of the posterior cruciate ligament. 2. Partial tear of the medial collateral ligament. 3. The menisci, anterior cruciate ligament and lateral collateral ligament complex are intact. 4. Bone contusions of the lateral femoral condyle and medial tibial plateau. No definite cortical fracture or lipohemarthrosis identified. Small nonspecific knee joint effusion.  PATIENT SURVEYS:  LEFS 56  COGNITION: Overall cognitive status: Within functional limits for tasks assessed     SENSATION: WFL  EDEMA:  Circumferential: no significant difference between R and L at proximal patella, distal patella, and mid gastroc   POSTURE: Pt able to stand full weight bearing on R LE without her brace on and no pain  PALPATION: TTP along R MCL, (+) swelling noted  LOWER EXTREMITY ROM:  Active ROM Right eval Left eval  Hip flexion    Hip extension    Hip abduction    Hip adduction    Hip internal rotation    Hip external rotation    Knee flexion 128 140  Knee extension 2 hyperext 5 hyperext  Ankle dorsiflexion    Ankle plantarflexion    Ankle inversion    Ankle eversion     (Blank rows = not tested)  LOWER EXTREMITY MMT:  MMT Right eval Left eval  Hip flexion    Hip extension 4   Hip abduction 4   Hip adduction    Hip internal rotation    Hip external rotation    Knee flexion 4   Knee extension Impaired R quad  set Normal L quad set  Ankle dorsiflexion    Ankle plantarflexion    Ankle inversion    Ankle eversion     (Blank rows = not tested)  LOWER EXTREMITY SPECIAL TESTS:  (+) laxity with posterior drawer R knee (+) laxity and pain with valgus stress test MCL at 20 deg knee flex  FUNCTIONAL TESTS:  Stair assessment: pt able to ascend/descend 4 stairs in clinic with b/l railing and no brace on, used reciprocal gait pattern and was safe, reports no pain; focused on task without distractions and was safe with slow speed and not carrying anything extra in hands so she could use the  railing b/l    Single leg balance: pt able to stand full WB on R LE today without brace and 0/10 pain, improved sway of trunk noted vs L SL balance, 30 second R, 45 second L GAIT: Pt amb wearing short hinged knee brace into/out of clinic, no crutches, able to achieve good R heel strike with brace on                                                                                                                               TREATMENT DATE: 02/10/24  Subjective: Pt notes some soreness along lateral knee upon arrival. Continues working on her HEP consistently.  Has a dog show this weekend; leaves tomorrow for this trip.  Pain 0/10 Objective:  (+) MTP L vastus lateralis today, (+) TTP along R distal ITB; improving overall  Manual Therapy: STM R vastus lateralis MTP focus Rolling stick x 6 min vastus lateralis Manual prone quad stretch: 3x 45 seconds- not today Manual supine quad stretch: 3x45 sec  Therapeutic exercises: Nustep: seat #0 x 10 minutes, level 4 x 3 min, level 5 x 3 min, level 6 x 4 min- not today Lateral lunge: praticed technique, PT verbal/tactile cues 3x10 ea direction, practiced 5 second holds Hamstring stool walk- hallway 4 laps as active warm up today Standing quad stretch with foot on stool: 1 min Split squat with TRX 2x15, to simulate movement she needs to do when stacking a dog in her  competitions- R knee down onto airex- able to move through full motion today TRX squats to 24 in box 2x12 8 inch step: x15 ea direction- not today Step sequence: 8 inch: 15x front, 15 lateral step down, 15x forward step down, 2 rounds- not today, discussed how to progress for HEP with holding a 8-10 lb weight Dead lifts: 2x 9# weights 2x10- not today Total gym: holding 10 lb medicine ball x10 b/l, 3x10 R LE only Forward walking lunges on grass: 3 laps x 20 ft- not today Walking forward lunge with trunk twist: x3 laps, holding 5 lb med ball Walking forward lunges with mini front leg hops R/L, 3 laps x 20 ft holding 5 lb med ball  Therapeutic activities:  Front step up to SLS R LE holding 6# weight x20- not today Front step up onto bosu x3 with 30 second SLS Forward/reverse jog on straight/flat surface 6 laps in hallway- not today Lateral shuffles R/L x 3 laps in hallway- not today Agility ladder on floor: hopscotch jump 2 feet land 1 in forward progression, 6 laps Lateral "speed skaters" today with PT verbal/tactile cues for CKC weight acceptance/pelvic neutral position on R LE. 5 intervals x 45 seconds Single leg forward/reverse alternating bounding L LE to R LE then back to L, and then switched directions, 3 rounds x 45 seconds ea Jump squats with 3 second hold x8, 2 sets   Neuro re-ed: not today Bosu squat holds: 5 second  holds x 20, 2 sets (standing on flat surface) Bosu stand on soft part mini squat with chest pass at trampoline 3x 1 min intervals Lateral step up and over on bosu: 2 min- not today SLS 30 seconds x 3 R LE bosu  SLS on airex cushion 3x 30 seconds- with ball toss various angles- not today SLS 5# ball toss to trampoline: 30 seconds x 3- not today SLS 3# ball toss diagonal with PT: 3x30 sec ea direction  Pt demonstrates reciprocal gait on stairs: 3 laps, good/fair control in R knee, without UE use on railing- not today  Pt demonstrates * sign: deep split squat  simulating to pick something up (ie: dog food/crate/dog) with R LE behind and L in front recreates her pain, lacks strength to push back up from there  HEP instruction (see below)  Self care/pt education/ brace instructions- d/c brace PATIENT EDUCATION:  Education details: PT POC/goals, HEP (see below), exercise technique Person educated: Patient Education method: Explanation, Verbal cues, and Handouts Education comprehension: verbalized understanding, returned demonstration, and needs further education  HOME EXERCISE PROGRAM: Access Code: [email or web address] Added step up sequence (see medbridge) ASSESSMENT:  CLINICAL IMPRESSION: Continued to address R LE power/strength deficits with progression of plyometric exercises today.  Pt able to push off and land on single R LE in forward and reverse and lateral directions today without c/o knee pain during low level plyometric activities. Dynamic knee valgus noted with jump squats, pt able to improve form with use of mirror feedback.  Able to perform split squat through full depth today in clinic without increased knee pain.  She should continue to benefit from skilled PT to address impairments listed below and facilitate return to PLOF after her R knee injury on 10/30/23 where she sustained R knee PCL injury and R MCL injury.  Plan to continue addressing impairments with LE strength and power deficits through full ROM and single leg CKC dynamic stability in preparation for transitioning to independent HEP and DC in the next 2 weeks.   OBJECTIVE IMPAIRMENTS: Abnormal gait, decreased activity tolerance, decreased balance, decreased endurance, decreased mobility, difficulty walking, decreased ROM, decreased strength, impaired perceived functional ability, and pain.   ACTIVITY LIMITATIONS: carrying, lifting, bending, standing, squatting, stairs, transfers, locomotion level, and caring for others  PARTICIPATION LIMITATIONS: cleaning, community activity, occupation,  and yard work  PERSONAL FACTORS: Past/current experiences and Time since onset of injury/illness/exacerbation are also affecting patient's functional outcome.   REHAB POTENTIAL: Good  CLINICAL DECISION MAKING: Stable/uncomplicated  EVALUATION COMPLEXITY: Low   GOALS: Goals reviewed with patient? 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  Improve R LE strength 1 MMT grade for knee extension to facilitate improved ability to walk/run on uneven surfaces for dog training responsibilities Baseline: 4/5; 4+/5 01/11/24 Goal status: In progress  3.  Pt will tolerate being able to walk/run/pivot/change directions quickly on uneven surfaces x 20 minutes for professional dog showing responsibilities without being limited by R knee pain/instability Baseline: unable; wearing knee brace during the day;  Goal status:  In progress   PLAN:  PT FREQUENCY: 2x/week  PT DURATION: 6 weeks  PLANNED INTERVENTIONS: 97110-Therapeutic exercises, 97530- Therapeutic activity, 97112- Neuromuscular re-education, 97535- Self Care, 16109- Manual therapy, [phone]- Gait training, Patient/Family education, Taping, Dry Needling, and Cryotherapy  PLAN FOR NEXT SESSION: LE strengthening, quad mm activation, CKC proprioception, agility/plyometrics  Lucrecia Sables, PT, DPT, OCS   Kin Penner, PT 02/10/2024, 10:42 AM

## 2024-02-17 ENCOUNTER — Ambulatory Visit

## 2024-02-17 DIAGNOSIS — S83411D Sprain of medial collateral ligament of right knee, subsequent encounter: Secondary | ICD-10-CM | POA: Diagnosis not present

## 2024-02-17 DIAGNOSIS — M25561 Pain in right knee: Secondary | ICD-10-CM | POA: Diagnosis not present

## 2024-02-17 DIAGNOSIS — S8991XD Unspecified injury of right lower leg, subsequent encounter: Secondary | ICD-10-CM

## 2024-02-17 DIAGNOSIS — R29898 Other symptoms and signs involving the musculoskeletal system: Secondary | ICD-10-CM | POA: Diagnosis not present

## 2024-02-17 DIAGNOSIS — G8929 Other chronic pain: Secondary | ICD-10-CM

## 2024-02-17 DIAGNOSIS — M79604 Pain in right leg: Secondary | ICD-10-CM | POA: Diagnosis not present

## 2024-02-17 NOTE — Therapy (Signed)
 OUTPATIENT PHYSICAL THERAPY LOWER EXTREMITY TREATMENT/Recert through 02/21/24   Patient Name: Janet Brock MRN: [id number] DOB:1956-07-18, 68 y.o., female Today's Date: 02/17/2024  END OF SESSION:  PT End of Session - 02/17/24 0951     Visit Number 17    Number of Visits 22    Date for PT Re-Evaluation 02/21/24    Authorization Type 2x/week x 6 weeks (re-cert 4/0/98), Medicare POC progresse noted needed at visit #20    PT Start Time 0950    PT Stop Time 1035    PT Time Calculation (min) 45 min    Activity Tolerance Patient tolerated treatment well    Behavior During Therapy WFL for tasks assessed/performed             Past Medical History:  Diagnosis Date   Anemia    past hx - off Iron   Heart murmur    History of varicella    Hypothyroidism    synthroid   ? hyperthyroid when young and thin and then  dx hypo dx at clinic at work    Migraines    pre menopausal  hx of vicodin  rescue    Past Surgical History:  Procedure Laterality Date   ABLATION     ? year    btl     CATARACT EXTRACTION Bilateral    12/06/2021, 12/20/2021   COLONOSCOPY     approx 2 years ago   HYSTEROSCOPY  05/08/2011   Procedure: HYSTEROSCOPY WITH HYDROTHERMAL ABLATION;  Surgeon: Hamp Levine;  Location: [facility];  Service: Gynecology;  Laterality: N/A;   svd       x 2   WISDOM TOOTH EXTRACTION     Patient Active Problem List   Diagnosis Date Noted   Breast lump 05/19/2018   Greater trochanteric bursitis of right hip 11/14/2016   Hypothyroidism     PCP: Dr. Ethel Henry  REFERRING PROVIDER: Dr. Alease Hunter, MD  REFERRING DIAG: [phone] (ICD-10-CM) - Chronic pain of right knee   THERAPY DIAG:  Chronic pain of right knee  Sprain of medial collateral ligament of right knee, subsequent encounter  Injury of posterior cruciate ligament of right knee, subsequent encounter  Rationale for Evaluation and Treatment: Rehabilitation  ONSET DATE: 10/30/23  SUBJECTIVE:   SUBJECTIVE STATEMENT: Pt  presents with recent R knee injury- was out training her dogs and leash got wrapped around her ankle as a dog went running and pulled her legs out from under her and sh sustained a big fall.  Initially the knee was pretty bruised/swollen after the injury.  Went to see sports medicine physician, had imaging done (see report below).  Has PCL injury and MCL injury.  Not pursuing surgical intervention, would like to work on PT after this injury.  PERTINENT HISTORY: Pt is wearing a knee brace with stabilizers on both sides; overall doing better- pain and swelling have improved She had an injection before going to [place] and was able to do her trip last week (wore brace) She is wearing the brace during the day, not sleeping in it  When wearing the brace she notices she can do most of her activities during the day, but she has not been doing anything without the brace- some discomfort with prolonged use  Current activity level: doing some walking, no turns, no show activities with her dog, wearing knee brace every day  Had a hip injection 2 months ago for her R hip and was doing well with her hip before her knee  injury  Managing sx with Tylenol  at night and NSAIDs during the day   PAIN:  Are you having pain? Yes, achy feeling; 0/10-2/10    PRECAUTIONS: None  RED FLAGS: None   WEIGHT BEARING RESTRICTIONS: No  FALLS:  Has patient fallen in last 6 months? 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 Alease Hunter; not scheduled for surgery  OBJECTIVE:  Note: Objective measures were completed at Evaluation unless otherwise noted.  DIAGNOSTIC FINDINGS: MRI of knee 11/15/22 IMPRESSION: 1. High-grade, probable complete midsubstance tear of the posterior cruciate ligament. 2. Partial tear of the medial collateral ligament. 3. The menisci, anterior cruciate ligament and lateral collateral ligament complex are intact. 4. Bone contusions of the lateral femoral condyle and medial tibial plateau. No definite cortical fracture or lipohemarthrosis identified. Small nonspecific knee joint effusion.  PATIENT SURVEYS:  LEFS 56  COGNITION: Overall cognitive status: Within functional limits for tasks assessed     SENSATION: WFL  EDEMA:  Circumferential: no significant difference between R and L at proximal patella, distal patella, and mid gastroc   POSTURE: Pt able to stand full weight bearing on R LE without her brace on and no pain  PALPATION: TTP along R MCL, (+) swelling noted  LOWER EXTREMITY ROM:  Active ROM Right eval Left eval  Hip flexion    Hip extension    Hip abduction    Hip adduction    Hip internal rotation    Hip external rotation    Knee flexion 128 140  Knee extension 2 hyperext 5 hyperext  Ankle dorsiflexion    Ankle plantarflexion    Ankle inversion    Ankle eversion     (Blank rows = not tested)  LOWER EXTREMITY MMT:  MMT Right eval Left eval  Hip flexion    Hip extension 4   Hip abduction 4   Hip adduction    Hip internal rotation    Hip external rotation    Knee flexion 4   Knee extension Impaired R quad set Normal L quad set  Ankle dorsiflexion    Ankle  plantarflexion    Ankle inversion    Ankle eversion     (Blank rows = not tested)  LOWER EXTREMITY SPECIAL TESTS:  (+) laxity with posterior drawer R knee (+) laxity and pain with valgus stress test MCL at 20 deg knee flex  FUNCTIONAL TESTS:  Stair assessment: pt able to ascend/descend 4 stairs in clinic with b/l railing and no brace on, used reciprocal gait pattern and was safe, reports no pain; focused on task without distractions and was safe with slow speed and not carrying anything extra in hands so she could use the railing b/l    Single  leg balance: pt able to stand full WB on R LE today without brace and 0/10 pain, improved sway of trunk noted vs L SL balance, 30 second R, 45 second L GAIT: Pt amb wearing short hinged knee brace into/out of clinic, no crutches, able to achieve good R heel strike with brace on                                                                                                                               TREATMENT DATE: 02/17/24  Subjective: Pt reports she did her dog shows this weekend; she is sore in her muscles around the knee but no instability or knee giving out.  Just some soreness from the physical level of activity.  Feels like she is making progress with PT.    Pain 0/10 Objective:  (+) MTP L vastus lateralis today, (+) TTP along R distal ITB; improving overall LEFS: 77/80(was 56 at initial eval)  Manual Therapy: STM R vastus lateralis MTP focus Rolling stick x 6 min vastus lateralis Manual prone quad stretch: 3x 45 seconds- not today Manual supine quad stretch: 3x45 sec  Therapeutic exercises: Nustep: seat #9 x 10 minutes, level 4 x 3 min, level 5 x 3 min, level 6 x 4 min Lateral lunge: praticed technique, PT verbal/tactile cues 3x10 ea direction, practiced 5 second holds Hamstring stool walk- hallway 4 laps- not today Standing quad stretch with foot on stool: 1 min Split squat with TRX 2x15, to simulate movement she needs to do when  stacking a dog in her competitions- R knee down onto airex- able to move through full motion today TRX squats to 24 in box 2x12 TRX: rows, wide arm high row x15 ea 8 inch step: x15 ea direction- not today Step sequence: 8 inch: 15x front, 15 lateral step down, 15x forward step down, 2 rounds- not today, discussed how to progress for HEP with holding a 8-10 lb weight Dead lifts: 2x 9# weights 2x10- not today Total gym: holding 10 lb medicine ball x10 b/l, 3x10 R LE only Forward walking lunges on grass: 3 laps x 20 ft- not today Walking forward lunge with trunk twist: x3 laps, holding 5 lb med ball Walking forward lunges with mini front leg hops R/L, 3 laps x 20 ft holding 5 lb med ball  Therapeutic activities:  Front step up to SLS R LE holding 6# weight x20- not today Front step up onto bosu x3 with 30 second SLS- not today Forward/reverse jog on straight/flat surface 6 laps in hallway- not today Lateral shuffles R/L x 3 laps in hallway- not today Agility ladder on floor: hopscotch jump 2 feet land 1 in forward progression, 6 laps Lateral "speed skaters" today with PT verbal/tactile cues for CKC weight acceptance/pelvic neutral position on R LE. 5 intervals x 45 seconds Forward diagonal alternating SL bounding 4 laps in hallway Single leg forward/reverse alternating bounding L LE to R LE then  back to L, and then switched directions, 3 rounds x 45 seconds ea Jump squats with 3 second hold x8, 2 sets Practiced various transfers standing down to half kneel, tall kneel, quadruped and up to standing again without any additional UE support- 5 min, to simulate functional activities she does for gardening and cleaning dog crates  LEFS administered  Neuro re-ed: not today Bosu squat holds: 5 second holds x 20, 2 sets (standing on flat surface) Bosu stand on soft part mini squat with chest pass at trampoline 3x 1 min intervals Lateral step up and over on bosu: 2 min- not today SLS 30 seconds x 3 R  LE bosu  SLS on airex cushion 3x 30 seconds- with ball toss various angles- not today SLS 5# ball toss to trampoline: 30 seconds x 3- not today SLS 3# ball toss diagonal with PT: 3x30 sec ea direction  Pt demonstrates reciprocal gait on stairs: 3 laps, good/fair control in R knee, without UE use on railing- not today  Pt demonstrates * sign: deep split squat simulating to pick something up (ie: dog food/crate/dog) with R LE behind and L in front recreates her pain, lacks strength to push back up from there  HEP instruction (see below)  Self care/pt education/ brace instructions- d/c brace PATIENT EDUCATION:  Education details: PT POC/goals, HEP (see below), exercise technique Person educated: Patient Education method: Explanation, Verbal cues, and Handouts Education comprehension: verbalized understanding, returned demonstration, and needs further education  HOME EXERCISE PROGRAM: Access Code: [email or web address] Added step up sequence (see medbridge) ASSESSMENT:  CLINICAL IMPRESSION: Pt functionally demonstrates ability to perform all transfers on/off floor she needs to for garden and dog care tasks.  She is able to perform dynamic multidirectional plyometric type activities from 2 feet to 1 or 1 foot to 1 foot without report of knee instability or pain. Planning for goal reassessment and objective measurement reassessment, anticipate transition to independent HEP at next session.  OBJECTIVE IMPAIRMENTS: Abnormal gait, decreased activity tolerance, decreased balance, decreased endurance, decreased mobility, difficulty walking, decreased ROM, decreased strength, impaired perceived functional ability, and pain.   ACTIVITY LIMITATIONS: carrying, lifting, bending, standing, squatting, stairs, transfers, locomotion level, and caring for others  PARTICIPATION LIMITATIONS: cleaning, community activity, occupation, and yard work  PERSONAL FACTORS: Past/current experiences and Time since onset of  injury/illness/exacerbation are also affecting patient's functional outcome.   REHAB POTENTIAL: Good  CLINICAL DECISION MAKING: Stable/uncomplicated  EVALUATION COMPLEXITY: Low   GOALS: Goals reviewed with patient? 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  Improve R LE strength 1 MMT grade for knee extension to facilitate improved ability to walk/run on uneven surfaces for dog training responsibilities Baseline: 4/5; 4+/5 01/11/24 Goal status: In progress  3.  Pt will tolerate being able to walk/run/pivot/change directions quickly on uneven surfaces x 20 minutes for professional dog showing responsibilities without being limited by R knee pain/instability Baseline: unable; wearing knee brace during the day;  Goal status: In progress   PLAN:  PT FREQUENCY: 2x/week  PT DURATION: 6 weeks  PLANNED INTERVENTIONS: 97110-Therapeutic exercises, 97530- Therapeutic activity, [id number]- Neuromuscular re-education, 97535- Self Care, [id number]- Manual therapy, [phone]- Gait training, Patient/Family education, Taping, Dry Needling, and Cryotherapy  PLAN FOR NEXT SESSION: next visit plan for re-assessment/obtain objective measures, discuss any questions/concerns with HEP before transitioning to independent HEP, anticipate at next visit likely DC  Lucrecia Sables, PT, DPT, OCS   Semiyah Newgent E Enrrique Mierzwa, PT 02/17/2024, 10:47 AM

## 2024-02-19 ENCOUNTER — Ambulatory Visit

## 2024-02-19 DIAGNOSIS — S83411D Sprain of medial collateral ligament of right knee, subsequent encounter: Secondary | ICD-10-CM

## 2024-02-19 DIAGNOSIS — G8929 Other chronic pain: Secondary | ICD-10-CM | POA: Diagnosis not present

## 2024-02-19 DIAGNOSIS — S8991XD Unspecified injury of right lower leg, subsequent encounter: Secondary | ICD-10-CM | POA: Diagnosis not present

## 2024-02-19 DIAGNOSIS — R29898 Other symptoms and signs involving the musculoskeletal system: Secondary | ICD-10-CM | POA: Diagnosis not present

## 2024-02-19 DIAGNOSIS — M79604 Pain in right leg: Secondary | ICD-10-CM | POA: Diagnosis not present

## 2024-02-19 DIAGNOSIS — M25561 Pain in right knee: Secondary | ICD-10-CM | POA: Diagnosis not present

## 2024-02-19 NOTE — Therapy (Signed)
 OUTPATIENT PHYSICAL THERAPY LOWER EXTREMITY TREATMENT/Discharge summary note   Patient Name: Janet Brock MRN: [id number] DOB:Dec 09, 1955, 68 y.o., female Today's Date: 02/19/2024  END OF SESSION:  PT End of Session - 02/19/24 1036     Visit Number 18    Number of Visits 22    Date for PT Re-Evaluation 02/21/24    Authorization Type 2x/week x 6 weeks (re-cert 4/0/98), Medicare POC progresse noted needed at visit #20    PT Start Time 1020    PT Stop Time 1105    PT Time Calculation (min) 45 min    Activity Tolerance Patient tolerated treatment well    Behavior During Therapy WFL for tasks assessed/performed             Past Medical History:  Diagnosis Date   Anemia    past hx - off Iron   Heart murmur    History of varicella    Hypothyroidism    synthroid   ? hyperthyroid when young and thin and then  dx hypo dx at clinic at work    Migraines    pre menopausal  hx of vicodin  rescue    Past Surgical History:  Procedure Laterality Date   ABLATION     ? year    btl     CATARACT EXTRACTION Bilateral    12/06/2021, 12/20/2021   COLONOSCOPY     approx 2 years ago   HYSTEROSCOPY  05/08/2011   Procedure: HYSTEROSCOPY WITH HYDROTHERMAL ABLATION;  Surgeon: Hamp Levine;  Location: [facility];  Service: Gynecology;  Laterality: N/A;   svd       x 2   WISDOM TOOTH EXTRACTION     Patient Active Problem List   Diagnosis Date Noted   Breast lump 05/19/2018   Greater trochanteric bursitis of right hip 11/14/2016   Hypothyroidism     PCP: Dr. Ethel Henry  REFERRING PROVIDER: Dr. Alease Hunter, MD  REFERRING DIAG: [phone] (ICD-10-CM) - Chronic pain of right knee   THERAPY DIAG:  Chronic pain of right knee  Sprain of medial collateral ligament of right knee, subsequent encounter  Injury of posterior cruciate ligament of right knee, subsequent encounter  Rationale for Evaluation and Treatment: Rehabilitation  ONSET DATE: 10/30/23  SUBJECTIVE:   SUBJECTIVE  STATEMENT: Pt presents with recent R knee injury- was out training her dogs and leash got wrapped around her ankle as a dog went running and pulled her legs out from under her and sh sustained a big fall.  Initially the knee was pretty bruised/swollen after the injury.  Went to see sports medicine physician, had imaging done (see report below).  Has PCL injury and MCL injury.  Not pursuing surgical intervention, would like to work on PT after this injury.  PERTINENT HISTORY: Pt is wearing a knee brace with stabilizers on both sides; overall doing better- pain and swelling have improved She had an injection before going to [place] and was able to do her trip last week (wore brace) She is wearing the brace during the day, not sleeping in it  When wearing the brace she notices she can do most of her activities during the day, but she has not been doing anything without the brace- some discomfort with prolonged use  Current activity level: doing some walking, no turns, no show activities with her dog, wearing knee brace every day  Had a hip injection 2 months ago for her R hip and was doing well with her hip before her knee  injury  Managing sx with Tylenol  at night and NSAIDs during the day   PAIN:  Are you having pain? Yes, achy feeling; 0/10-2/10    PRECAUTIONS: None  RED FLAGS: None   WEIGHT BEARING RESTRICTIONS: No  FALLS:  Has patient fallen in last 6 months? 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 Alease Hunter; not scheduled for surgery  OBJECTIVE:  Note: Objective measures were completed at Evaluation unless otherwise noted.  DIAGNOSTIC FINDINGS: MRI of knee 11/15/22 IMPRESSION: 1. High-grade, probable complete midsubstance tear of the posterior cruciate ligament. 2. Partial tear of the medial collateral ligament. 3. The menisci, anterior cruciate ligament and lateral collateral ligament complex are intact. 4. Bone contusions of the lateral femoral condyle and medial tibial plateau. No definite cortical fracture or lipohemarthrosis identified. Small nonspecific knee joint effusion.  PATIENT SURVEYS:  LEFS 56  COGNITION: Overall cognitive status: Within functional limits for tasks assessed     SENSATION: WFL  EDEMA:  Circumferential: no significant difference between R and L at proximal patella, distal patella, and mid gastroc   POSTURE: Pt able to stand full weight bearing on R LE without her brace on and no pain  PALPATION: TTP along R MCL, (+) swelling noted  LOWER EXTREMITY ROM:  Active ROM Right eval Left eval  Hip flexion    Hip extension    Hip abduction    Hip adduction    Hip internal rotation    Hip external rotation    Knee flexion 128 140  Knee extension 2 hyperext 5 hyperext  Ankle dorsiflexion    Ankle plantarflexion    Ankle inversion    Ankle eversion     (Blank rows = not tested)  LOWER EXTREMITY MMT:  MMT Right eval Left eval  Hip flexion    Hip extension 4   Hip abduction 4   Hip adduction    Hip internal rotation    Hip external rotation    Knee flexion 4   Knee extension Impaired R quad set Normal L quad set  Ankle  dorsiflexion    Ankle plantarflexion    Ankle inversion    Ankle eversion     (Blank rows = not tested)  LOWER EXTREMITY SPECIAL TESTS:  (+) laxity with posterior drawer R knee (+) laxity and pain with valgus stress test MCL at 20 deg knee flex  FUNCTIONAL TESTS:  Stair assessment: pt able to ascend/descend 4 stairs in clinic with b/l railing and no brace on, used reciprocal gait pattern and was safe, reports no pain; focused on task without distractions and was safe with slow speed and not carrying anything extra in hands so she could use the railing b/l    Single  leg balance: pt able to stand full WB on R LE today without brace and 0/10 pain, improved sway of trunk noted vs L SL balance, 30 second R, 45 second L GAIT: Pt amb wearing short hinged knee brace into/out of clinic, no crutches, able to achieve good R heel strike with brace on                                                                                                                               TREATMENT DATE: 02/19/24  Subjective: Pt reports she plans to continue working on LE strengthening, PT HEP, and increasing her physical activity at home- taking care of gardens, landscaping, manual labor at her home.  She has resumed all her activities with showing her dogs competitively and is not limited by her R knee.     Pain 0/10 Objective:  R knee: AROM 140 deg flexion MMT: knee flex and ext R 5/5 now LEFS: 77/80(was 56 at initial eval)  Therapeutic exercises: Nustep: seat #9 x 10 minutes, level 5 x 3 min, level 6 x 3 min, level 6 x 4 min Lateral lunge: praticed technique, PT verbal/tactile cues 3x10 ea direction, practiced 5 second holds Hamstring stool walk- hallway 4 laps- not today Standing quad stretch with foot on stool: 1 min Split squat with TRX 2x15, to simulate movement she needs to do when stacking a dog in her competitions- R knee down onto airex- able to move through full motion today TRX squats to 24 in box  2x12 TRX: rows, wide arm high row x15 ea 8 inch step: x15 ea direction- not today Step sequence: 8 inch: 15x front, 15 lateral step down, 15x forward step down, 2 rounds- not today, discussed how to progress for HEP with holding a 8-10 lb weight Walking forward lunge with trunk twist: x3 laps, holding 5 lb med ball Walking forward lunges with mini front leg hops R/L, 3 laps x 20 ft holding 5 lb med ball  Therapeutic activities:   Agility ladder on floor: hopscotch jump 2 feet land 1 in forward progression, 6 laps Lateral "speed skaters" today with PT verbal/tactile cues for CKC weight acceptance/pelvic neutral position on R LE. 5 intervals x 45 seconds Forward diagonal alternating SL bounding 4 laps in hallway Single leg forward/reverse alternating bounding L LE to R LE then back to L, and then switched directions, 3 rounds x 45 seconds ea Jump squats with 3 second hold x8, 2 sets Practiced various transfers standing down to half kneel, tall kneel, quadruped and up to standing again without any additional UE support- reviewed technique today (to simulate functional activities she does for gardening and cleaning dog crates)  Pt education for how to continue with LE strengthening at home independently; and how to use her functional activities as ways to continue building strength too with stepping up into her travel van, squats and tall kneeling while gardening and practicing getting up with  no hands; how to perform the agility exercises  PATIENT EDUCATION:  Education details: PT POC/goals, HEP (see below), exercise technique Person educated: Patient Education method: Explanation, Verbal cues, and Handouts Education comprehension: verbalized understanding, returned demonstration, and needs further education  HOME EXERCISE PROGRAM: Access Code: [email or web address] Added step up sequence (see medbridge) ASSESSMENT:  CLINICAL IMPRESSION: Pt has met PT goals for this course of PT.  She did very well and  has resumed her PLOF at this point in time.  Improvements have been made with R knee AROM, strength, power, endurance.  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  She did very well with PT; and remains motivated to continue strengthening independently.  Planning to DC this course of tx.  OBJECTIVE IMPAIRMENTS: Abnormal gait, decreased activity tolerance, decreased balance, decreased endurance, decreased mobility, difficulty walking, decreased ROM, decreased strength, impaired perceived functional ability, and pain.   ACTIVITY LIMITATIONS: carrying, lifting, bending, standing, squatting, stairs, transfers, locomotion level, and caring for others  PARTICIPATION LIMITATIONS: cleaning, community activity, occupation, and yard work  PERSONAL FACTORS: Past/current experiences and Time since onset of injury/illness/exacerbation are also affecting patient's functional outcome.   REHAB POTENTIAL: Good  CLINICAL DECISION MAKING: Stable/uncomplicated  EVALUATION COMPLEXITY: Low   GOALS: Goals reviewed with patient? Yes  SHORT TERM GOALS: Target date: 01/02/24 Pt will achieve 140 deg knee flexion AROM on R (symmetrical to L) to facilitate being able to perform deep squat for dog and house work responsibilities Baseline:unable, 01/11/24: 140 deg flexion Goal status: Met; 5/30 able to perform split squat and deep squat   LONG TERM GOALS: Target date: 02/21/24  Improve LEFS >18 (MCID 9) indicating pt able to perform her daily activities as a dog trainerwithout being limited by R knee Baseline: 56; 5/30: 77 Goal status: Met  2.  Improve R LE strength 1 MMT grade for knee extension to facilitate improved ability to walk/run on uneven surfaces for dog training responsibilities Baseline: 4/5; 4+/5 01/11/24; 5/30: 5/5 Goal status: Met  3.  Pt will tolerate being able to walk/run/pivot/change directions quickly on uneven surfaces x 20 minutes for  professional dog showing responsibilities without being limited by R knee pain/instability Baseline: unable; wearing knee brace during the day; 5/30: able to perform all of her competitive dog showing physical tasks without pain, able to run with the dog in front of judges- multiple loops, multiple reps depending on the judges Goal status: Met  PLAN:  PT FREQUENCY: 2x/week  PT DURATION: 6 weeks  PLANNED INTERVENTIONS: 97110-Therapeutic exercises, 97530- Therapeutic activity, 97112- Neuromuscular re-education, 97535- Self Care, 16109- Manual therapy, 97116- Gait training, Patient/Family education, Taping, Dry Needling, and Cryotherapy  PLAN FOR NEXT SESSION: DC this course of PT  Lucrecia Sables, PT, DPT, OCS   Kin Penner, PT 02/19/2024, 12:45 PM

## 2024-02-22 ENCOUNTER — Other Ambulatory Visit: Payer: Self-pay | Admitting: Internal Medicine

## 2024-02-22 MED ORDER — LEVOTHYROXINE SODIUM 100 MCG PO TABS: 100.0000 ug | ORAL_TABLET | Freq: Every day | ORAL | 2 refills | Status: AC

## 2024-02-22 NOTE — Telephone Encounter (Signed)
 Copied from CRM [id number]. Topic: Clinical - Medication Refill >> Feb 22, 2024  9:03 AM Aisha D wrote: Medication: levothyroxine  (SYNTHROID ) 100 MCG tablet  Has the patient contacted their pharmacy? No (Agent: If no, request that the patient contact the pharmacy for the refill. If patient does not wish to contact the pharmacy document the reason why and proceed with request.) (Agent: If yes, when and what did the pharmacy advise?)  This is the patient's preferred pharmacy:  [id number] [place], [place] - [address] [place] [address] Phone: [phone] Fax: [phone]  Is this the correct pharmacy for this prescription? Yes If no, delete pharmacy and type the correct one.   Has the prescription been filled recently? No  Is the patient out of the medication? No  Has the patient been seen for an appointment in the last year OR does the patient have an upcoming appointment? Yes  Can we respond through MyChart? Yes  Agent: Please be advised that Rx refills may take up to 3 business days. We ask that you follow-up with your pharmacy.

## 2024-03-09 DIAGNOSIS — Z85828 Personal history of other malignant neoplasm of skin: Secondary | ICD-10-CM | POA: Diagnosis not present

## 2024-03-09 DIAGNOSIS — L570 Actinic keratosis: Secondary | ICD-10-CM | POA: Diagnosis not present

## 2024-03-09 DIAGNOSIS — Z0100 Encounter for examination of eyes and vision without abnormal findings: Secondary | ICD-10-CM | POA: Diagnosis not present

## 2024-03-09 DIAGNOSIS — D225 Melanocytic nevi of trunk: Secondary | ICD-10-CM | POA: Diagnosis not present

## 2024-03-09 DIAGNOSIS — L578 Other skin changes due to chronic exposure to nonionizing radiation: Secondary | ICD-10-CM | POA: Diagnosis not present

## 2024-03-09 DIAGNOSIS — L821 Other seborrheic keratosis: Secondary | ICD-10-CM | POA: Diagnosis not present

## 2024-03-09 DIAGNOSIS — L814 Other melanin hyperpigmentation: Secondary | ICD-10-CM | POA: Diagnosis not present

## 2024-03-24 ENCOUNTER — Ambulatory Visit: Admitting: Family Medicine

## 2024-03-24 DIAGNOSIS — Z0000 Encounter for general adult medical examination without abnormal findings: Secondary | ICD-10-CM | POA: Diagnosis not present

## 2024-03-24 NOTE — Progress Notes (Signed)
 PATIENT CHECK-IN and HEALTH RISK ASSESSMENT QUESTIONNAIRE:  -completed by phone/video for upcoming Medicare Preventive Visit  Pre-Visit Check-in: 1)Vitals (height, wt, BP, etc) - record in vitals section for visit on day of visit Request home vitals (wt, BP, etc.) and enter into vitals, THEN update Vital Signs SmartPhrase below at the top of the HPI. See below.  2)Review and Update Medications, Allergies PMH, Surgeries, Social history in Epic 3)Hospitalizations in the last year with date/reason? n  4)Review and Update Care Team (patient's specialists) in Epic 5) Complete PHQ9 in Epic  6) Complete Fall Screening in Epic 7)Review all Health Maintenance Due and order under PCP if not done.  Medicare Wellness Patient Questionnaire:  Answer theses question about your habits: How often do you have a drink containing alcohol?1-time per week How many drinks containing alcohol do you have on a typical day when you are drinking?1 How often do you have six or more drinks on one occasion?never Have you ever smoked?n Quit date if applicable? na  How many packs a day do/did you smoke? na Do you use smokeless tobacco?n Do you use an illicit drugs?n On average, how many days per week do you engage in moderate to strenuous exercise (like a brisk walk)?5 On average, how many minutes do you engage in exercise at this level? 30 minutes, does home PT exercises 3 days a week, also home elliptical and bike and walking and tension bands, balance exercises too Typical diet: feels is healthy, cooks at home, lots of veggies and protein, very little carbs or processed foods  Beverages:   Answer theses question about your everyday activities: Can you perform most household chores?y Are you deaf or have significant trouble hearing?n Do you feel that you have a problem with memory?n Do you feel safe at home?y Last dentist visit?goes on a regular basis, has appt the 25th 8. Do you have any difficulty performing  your everyday activities?n Are you having any difficulty walking, taking medications on your own, and or difficulty managing daily home needs?n Do you have difficulty walking or climbing stairs?n Do you have difficulty dressing or bathing?n Do you have difficulty doing errands alone such as visiting a doctor's office or shopping?n Do you currently have any difficulty preparing food and eating?n Do you currently have any difficulty using the toilet?n Do you have any difficulty managing your finances?n Do you have any difficulties with housekeeping of managing your housekeeping?n   Do you have Advanced Directives in place (Living Will, Healthcare Power or Attorney)? Not yet - plans to do this   Last eye Exam and location?goes once a year, [facility]   Do you currently use prescribed or non-prescribed narcotic or opioid pain medications?n  Do you have a history or close family history of breast, ovarian, tubal or peritoneal cancer or a family member with BRCA (breast cancer susceptibility 1 and 2) gene mutations? Sister - sees gyn for fh       ----------------------------------------------------------------------------------------------------------------------------------------------------------------------------------------------------------------------  Because this visit was a virtual/telehealth visit, some criteria may be missing or patient reported. Any vitals not documented were not able to be obtained and vitals that have been documented are patient reported.    MEDICARE ANNUAL PREVENTIVE VISIT WITH PROVIDER: (Welcome to Medicare, initial annual wellness or annual wellness exam)  Virtual Visit via Video Note  I connected with Janet Brock on 03/24/24 by a video enabled telemedicine application and verified that I am speaking with the correct person using two identifiers.  Location patient:  home Location provider:work or home office Persons participating in the  virtual visit: patient, provider  Concerns and/or follow up today: doing well. Leg/knee doing better.    See HM section in Epic for other details of completed HM.    ROS: negative for report of fevers, unintentional weight loss, vision changes, vision loss, hearing loss or change, chest pain, sob, hemoptysis, melena, hematochezia, hematuria, falls, bleeding or bruising, thoughts of suicide or self harm, memory loss  Patient-completed extensive health risk assessment - reviewed and discussed with the patient: See Health Risk Assessment completed with patient prior to the visit either above or in recent phone note. This was reviewed in detailed with the patient today and appropriate recommendations, orders and referrals were placed as needed per Summary below and patient instructions.   Review of Medical History: -PMH, PSH, Family History and current specialty and care providers reviewed and updated and listed below   Patient Care Team: Panosh, Apolinar POUR, MD as PCP - General (Internal Medicine) Diedre Rosaline BRAVO, MD as Consulting Physician (Obstetrics and Gynecology)   Past Medical History:  Diagnosis Date   Anemia    past hx - off Iron   Heart murmur    History of varicella    Hypothyroidism    synthroid   ? hyperthyroid when young and thin and then  dx hypo dx at clinic at work    Migraines    pre menopausal  hx of vicodin  rescue     Past Surgical History:  Procedure Laterality Date   ABLATION     ? year    btl     CATARACT EXTRACTION Bilateral    12/06/2021, 12/20/2021   COLONOSCOPY     approx 2 years ago   HYSTEROSCOPY  05/08/2011   Procedure: HYSTEROSCOPY WITH HYDROTHERMAL ABLATION;  Surgeon: Oneil BRAVO Piety;  Location: [facility];  Service: Gynecology;  Laterality: N/A;   svd       x 2   WISDOM TOOTH EXTRACTION      Social History   Socioeconomic History   Marital status: Single    Spouse name: Not on file   Number of children: Not on file   Years of education:  Not on file   Highest education level: 12th grade  Occupational History   Not on file  Tobacco Use   Smoking status: Never   Smokeless tobacco: Never  Vaping Use   Vaping status: Never Used  Substance and Sexual Activity   Alcohol use: Yes    Alcohol/week: 2.0 standard drinks of alcohol    Types: 2 Glasses of wine per week   Drug use: No   Sexual activity: Yes    Partners: Male    Birth control/protection: Surgical  Other Topics Concern   Not on file  Social History Narrative   Receives 7 hours of sleep per night   Lives at home with her partner and sometimes her youngest son     Has 10 dogs (has show dogs), 1 cat and 5 goats   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    From [place]  has lived in [place]  and [place] .   Mom lives in [place]  father lives in [place] .   [place] p3   Father passed 2016  alzhiemer prostate cancer age 77   Mom 30 generally well   Social Drivers of Corporate investment banker Strain: Low Risk  (  03/20/2024)   Overall Financial Resource Strain (CARDIA)    Difficulty of Paying Living Expenses: Not hard at all  Food Insecurity: No Food Insecurity (03/20/2024)   Hunger Vital Sign    Worried About Running Out of Food in the Last Year: Never true    Ran Out of Food in the Last Year: Never true  Transportation Needs: No Transportation Needs (03/20/2024)   PRAPARE - Administrator, Civil Service (Medical): No    Lack of Transportation (Non-Medical): No  Physical Activity: Sufficiently Active (03/20/2024)   Exercise Vital Sign    Days of Exercise per Week: 5 days    Minutes of Exercise per Session: 30 min  Stress: No Stress Concern Present (03/20/2024)   [employer] of Occupational Health - Occupational Stress Questionnaire    Feeling of Stress: Not at all  Social Connections: Moderately Isolated (03/20/2024)   Social Connection and Isolation Panel    Frequency of Communication with  Friends and Family: Twice a week    Frequency of Social Gatherings with Friends and Family: Once a week    Attends Religious Services: Never    Database administrator or Organizations: No    Attends Engineer, structural: Not on file    Marital Status: Living with partner  Intimate Partner Violence: Not on file    Family History  Problem Relation Age of Onset   Leukemia Mother    Cancer Mother 91       Breast Cancer   Breast cancer Mother    Alcohol abuse Father    Heart disease Father    Dementia Father    Lymphoma Sister    Thyroid  disease Sister    Cancer Maternal Aunt        Breast, Skin   Leukemia Maternal Aunt    Breast cancer Maternal Aunt    Colon cancer Neg Hx    Colon polyps Neg Hx    Esophageal cancer Neg Hx    Rectal cancer Neg Hx    Stomach cancer Neg Hx     Current Outpatient Medications on File Prior to Visit  Medication Sig Dispense Refill   ASPIRIN 81 PO Take 1 tablet by mouth daily. Three x a week     levothyroxine  (SYNTHROID ) 100 MCG tablet Take 1 tablet (100 mcg total) by mouth daily. 90 tablet 2   No current facility-administered medications on file prior to visit.    Allergies  Allergen Reactions   Sumatriptan Shortness Of Breath and Other (See Comments)   Clavulanic Acid Nausea And Vomiting    Amoxicillin is ok not augmentin       Physical Exam Vitals requested from patient and listed below if patient had equipment and was able to obtain at home for this virtual visit: There were no vitals filed for this visit. Estimated body mass index is 24.96 kg/m as calculated from the following:   Height as of 11/18/23: 5' 5 (1.651 m).   Weight as of 11/18/23: 150 lb (68 kg).  EKG (optional): deferred due to virtual visit  GENERAL: alert, oriented, no acute distress detected, full vision exam deferred due to pandemic and/or virtual encounter  HEENT: atraumatic, conjunttiva clear, no obvious abnormalities on inspection of external nose and  ears  NECK: normal movements of the head and neck  LUNGS: on inspection no signs of respiratory distress, breathing rate appears normal, no obvious gross SOB, gasping or wheezing  CV: no obvious cyanosis  MS: moves all  visible extremities without noticeable abnormality  PSYCH/NEURO: pleasant and cooperative, no obvious depression or anxiety, speech and thought processing grossly intact, Cognitive function grossly intact  Flowsheet Row Office Visit from [facility]/08/2023 in [facility] [place]  PHQ-9 Total Score 1        03/24/2024   12:54 PM 04/08/2023   11:01 AM 01/08/2023   11:08 AM 11/05/2022    3:59 PM 11/26/2021    9:12 AM  Depression screen PHQ 2/9  Decreased Interest 0 0 0 0 0  Down, Depressed, Hopeless 0 0 0 0 0  PHQ - 2 Score 0 0 0 0 0  Altered sleeping  0  0 0  Tired, decreased energy  1  0 0  Change in appetite  0  0 0  Feeling bad or failure about yourself   0  0 0  Trouble concentrating  0  0 0  Moving slowly or fidgety/restless  0  0 0  Suicidal thoughts  0  0 0  PHQ-9 Score  1  0 0  Difficult doing work/chores  Not difficult at all          11/05/2022    3:59 PM 01/07/2023    2:00 PM 01/08/2023   11:08 AM 04/08/2023   11:01 AM 03/24/2024   12:54 PM  Fall Risk  Falls in the past year? 0 0 0 0 0  Was there an injury with Fall? 0  0 0 0  Fall Risk Category Calculator 0  0 0 0  Patient at Risk for Falls Due to No Fall Risks  No Fall Risks No Fall Risks   Fall risk Follow up Falls evaluation completed  Falls evaluation completed Falls evaluation completed      SUMMARY AND PLAN:  Encounter for Medicare annual wellness exam   Discussed applicable health maintenance/preventive health measures and advised and referred or ordered per patient preferences: -discussed mammograms, she does with gyn and agrees to call to see if due -she reports she has had both of the shingles vaccines -discussed vaccines due recs and risks and advised can get at the  pharmacy Health Maintenance  Topic Date Due   Zoster Vaccines- Shingrix (2 of 2) 12/26/2021   COVID-19 Vaccine (4 - 2024-25 season) 05/24/2023   MAMMOGRAM  06/26/2023   INFLUENZA VACCINE  04/22/2024   DTaP/Tdap/Td (2 - Td or Tdap) 06/06/2024   Medicare Annual Wellness (AWV)  03/24/2025   Colonoscopy  05/10/2025   Pneumococcal Vaccine: 50+ Years  Completed   DEXA SCAN  Completed   Hepatitis C Screening  Completed   Hepatitis B Vaccines  Aged Out   HPV VACCINES  Aged Out   Meningococcal B Vaccine  Aged Out      Education and counseling on the following was provided based on the above review of health and a plan/checklist for the patient, along with additional information discussed, was provided for the patient in the patient instructions :  -Advised on importance of completing advanced directives, discussed options for completing and provided information in patient instructions as well -Advised and counseled on a healthy lifestyle - including the importance of a healthy diet, regular physical activity, social connections and stress management. -Reviewed patient's current diet.  A summary of a healthy diet was provided in the Patient Instructions.  -reviewed patient's current physical activity level and provided exercise guidelines for adults and other resources - see patient instructions -Advise yearly dental visits at minimum and regular eye exams  Follow up: see patient instructions     Patient Instructions  I really enjoyed getting to talk with you today! I am available on Tuesdays and Thursdays for virtual visits if you have any questions or concerns, or if I can be of any further assistance.   CHECKLIST FROM ANNUAL WELLNESS VISIT:  -Follow up (please call to schedule if not scheduled after visit):   -yearly for annual wellness visit with primary care office  Here is a list of your preventive care/health maintenance measures and the plan for each if any are due:  PLAN  For any measures below that may be due:    1.) Call to schedule your mammogram.  2.) please obtain record of your shingles vaccine for Dr. Charlett.  3.)can get the covid vaccine and flu shots this fall at the pharmacy - let us  know when you do so that we can update your record.  Health Maintenance  Topic Date Due   Zoster Vaccines- Shingrix (2 of 2) 12/26/2021   COVID-19 Vaccine (4 - 2024-25 season) 05/24/2023   MAMMOGRAM  06/26/2023   INFLUENZA VACCINE  04/22/2024   DTaP/Tdap/Td (2 - Td or Tdap) 06/06/2024   Medicare Annual Wellness (AWV)  03/24/2025   Colonoscopy  05/10/2025   Pneumococcal Vaccine: 50+ Years  Completed   DEXA SCAN  Completed   Hepatitis C Screening  Completed   Hepatitis B Vaccines  Aged Out   HPV VACCINES  Aged Out   Meningococcal B Vaccine  Aged Out    -See a dentist at least yearly  -Get your eyes checked and then per your eye specialist's recommendations  -Other issues addressed today:   1. Advanced Directives - see below   -I have included below further information regarding a healthy whole foods based diet, physical activity guidelines for adults, stress management and opportunities for social connections. I hope you find this information useful.   -----------------------------------------------------------------------------------------------------------------------------------------------------------------------------------------------------------------------------------------------------------    NUTRITION: -eat real food: lots of colorful vegetables (half the plate) and fruits -5-7 servings of vegetables and fruits per day (fresh or steamed is best), exp. 2 servings of vegetables with lunch and dinner and 2 servings of fruit per day. Berries and greens such as kale and collards are great choices.  -consume on a regular basis:  fresh fruits, fresh veggies, fish, nuts, seeds, healthy oils (such as olive oil, avocado oil), whole grains (make sure  for bread/pasta/crackers/etc., that the first ingredient on label contains the word whole), legumes. -can eat small amounts of dairy and lean meat (no larger than the palm of your hand), but avoid processed meats such as ham, bacon, lunch meat, etc. -drink water -try to avoid fast food and pre-packaged foods, processed meat, ultra processed foods/beverages (donuts, candy, etc.) -most experts advise limiting sodium to < 2300mg  per day, should limit further is any chronic conditions such as high blood pressure, heart disease, diabetes, etc. The American Heart Association advised that < 1500mg  is is ideal -try to avoid foods/beverages that contain any ingredients with names you do not recognize  -try to avoid foods/beverages  with added sugar or sweeteners/sweets  -try to avoid sweet drinks (including diet drinks): soda, juice, Gatorade, sweet tea, power drinks, diet drinks -try to avoid white rice, white bread, pasta (unless whole grain)  EXERCISE GUIDELINES FOR ADULTS: -if you wish to increase your physical activity, do so gradually and with the approval of your doctor -STOP and seek medical care immediately if you have any chest pain, chest discomfort  or trouble breathing when starting or increasing exercise  -move and stretch your body, legs, feet and arms when sitting for long periods -Physical activity guidelines for optimal health in adults: -get at least 150 minutes per week of moderate exercise (can talk, but not sing); this is about 20-30 minutes of sustained activity 5-7 days per week or two 10-15 minute episodes of sustained activity 5-7 days per week -do some muscle building/resistance training/strength training at least 2 days per week  -balance exercises 3+ days per week:   Stand somewhere where you have something sturdy to hold onto if you lose balance    1) lift up on toes, then back down, start with 5x per day and work up to 20x   2) stand and lift one leg straight out to the  side so that foot is a few inches of the floor, start with 5x each side and work up to 20x each side   3) stand on one foot, start with 5 seconds each side and work up to 20 seconds on each side  If you need ideas or help with getting more active:  -Silver sneakers https://tools.silversneakers.com  -Walk with a Doc: http://www.duncan-williams.com/  -try to include resistance (weight lifting/strength building) and balance exercises twice per week: or the following link for ideas: http://castillo-powell.com/  BuyDucts.dk  STRESS MANAGEMENT: -can try meditating, or just sitting quietly with deep breathing while intentionally relaxing all parts of your body for 5 minutes daily -if you need further help with stress, anxiety or depression please follow up with your primary doctor or contact the wonderful folks at [facility]: [phone]  SOCIAL CONNECTIONS: -options in [place] if you wish to engage in more social and exercise related activities:  -Silver sneakers https://tools.silversneakers.com  -Walk with a Doc: http://www.duncan-williams.com/  -Check out the [facility] Active Adults 50+ section on the [place] of [employer] (hiking clubs, book clubs, cards and games, chess, exercise classes, aquatic classes and much more) - see the website for details: https://www.[place].gov/departments/parks-recreation/active-adults50  -YouTube has lots of exercise videos for different ages and abilities as well  -[facility] (a variety of indoor and outdoor inperson activities for adults). [phone]. [address]  -Virtual Online Classes (a variety of topics): see seniorplanet.org or call [phone]  -consider volunteering at a school, hospice center, church, senior center or elsewhere          ADVANCED HEALTHCARE DIRECTIVES:  [place] Advanced  Directives assistance:   ExpressWeek.com.cy  Everyone should have advanced health care directives in place. This is so that you get the care you want, should you ever be in a situation where you are unable to make your own medical decisions.   From the [place] Advanced Directive Website: Advance Health Care Directives are legal documents in which you give written instructions about your health care if, in the future, you cannot speak for yourself.   A health care power of attorney allows you to name a person you trust to make your health care decisions if you cannot make them yourself. A declaration of a desire for a natural death (or living will) is document, which states that you desire not to have your life prolonged by extraordinary measures if you have a terminal or incurable illness or if you are in a vegetative state. An advance instruction for mental health treatment makes a declaration of instructions, information and preferences regarding your mental health treatment. It also states that you are aware that the advance instruction authorizes  a mental health treatment provider to act according to your wishes. It may also outline your consent or refusal of mental health treatment. A declaration of an anatomical gift allows anyone over the age of 7 to make a gift by will, organ donor card or other document.   Please see the following website or an elder law attorney for forms, FAQs and for completion of advanced directives: [place]  Print production planner Health Care Directives Advance Health Care Directives (http://guzman.com/)  Or copy and paste the following to your web browser: [email or web address]    Chiquita JONELLE Cramp, DO

## 2024-03-24 NOTE — Patient Instructions (Addendum)
 I really enjoyed getting to talk with you today! I am available on Tuesdays and Thursdays for virtual visits if you have any questions or concerns, or if I can be of any further assistance.   CHECKLIST FROM ANNUAL WELLNESS VISIT:  -Follow up (please call to schedule if not scheduled after visit):   -yearly for annual wellness visit with primary care office  Here is a list of your preventive care/health maintenance measures and the plan for each if any are due:  PLAN For any measures below that may be due:    1.) Call to schedule your mammogram.  2.) please obtain record of your shingles vaccine for Dr. Charlett.  3.)can get the covid vaccine and flu shots this fall at the pharmacy - let us  know when you do so that we can update your record.  Health Maintenance  Topic Date Due   Zoster Vaccines- Shingrix (2 of 2) 12/26/2021   COVID-19 Vaccine (4 - 2024-25 season) 05/24/2023   MAMMOGRAM  06/26/2023   INFLUENZA VACCINE  04/22/2024   DTaP/Tdap/Td (2 - Td or Tdap) 06/06/2024   Medicare Annual Wellness (AWV)  03/24/2025   Colonoscopy  05/10/2025   Pneumococcal Vaccine: 50+ Years  Completed   DEXA SCAN  Completed   Hepatitis C Screening  Completed   Hepatitis B Vaccines  Aged Out   HPV VACCINES  Aged Out   Meningococcal B Vaccine  Aged Out    -See a dentist at least yearly  -Get your eyes checked and then per your eye specialist's recommendations  -Other issues addressed today:   1. Advanced Directives - see below   -I have included below further information regarding a healthy whole foods based diet, physical activity guidelines for adults, stress management and opportunities for social connections. I hope you find this information useful.    -----------------------------------------------------------------------------------------------------------------------------------------------------------------------------------------------------------------------------------------------------------    NUTRITION: -eat real food: lots of colorful vegetables (half the plate) and fruits -5-7 servings of vegetables and fruits per day (fresh or steamed is best), exp. 2 servings of vegetables with lunch and dinner and 2 servings of fruit per day. Berries and greens such as kale and collards are great choices.  -consume on a regular basis:  fresh fruits, fresh veggies, fish, nuts, seeds, healthy oils (such as olive oil, avocado oil), whole grains (make sure for bread/pasta/crackers/etc., that the first ingredient on label contains the word whole), legumes. -can eat small amounts of dairy and lean meat (no larger than the palm of your hand), but avoid processed meats such as ham, bacon, lunch meat, etc. -drink water -try to avoid fast food and pre-packaged foods, processed meat, ultra processed foods/beverages (donuts, candy, etc.) -most experts advise limiting sodium to < 2300mg  per day, should limit further is any chronic conditions such as high blood pressure, heart disease, diabetes, etc. The American Heart Association advised that < 1500mg  is is ideal -try to avoid foods/beverages that contain any ingredients with names you do not recognize  -try to avoid foods/beverages  with added sugar or sweeteners/sweets  -try to avoid sweet drinks (including diet drinks): soda, juice, Gatorade, sweet tea, power drinks, diet drinks -try to avoid white rice, white bread, pasta (unless whole grain)  EXERCISE GUIDELINES FOR ADULTS: -if you wish to increase your physical activity, do so gradually and with the approval of your doctor -STOP and seek medical care immediately if you have any chest pain, chest discomfort or trouble breathing when starting or  increasing exercise  -move and stretch  your body, legs, feet and arms when sitting for long periods -Physical activity guidelines for optimal health in adults: -get at least 150 minutes per week of moderate exercise (can talk, but not sing); this is about 20-30 minutes of sustained activity 5-7 days per week or two 10-15 minute episodes of sustained activity 5-7 days per week -do some muscle building/resistance training/strength training at least 2 days per week  -balance exercises 3+ days per week:   Stand somewhere where you have something sturdy to hold onto if you lose balance    1) lift up on toes, then back down, start with 5x per day and work up to 20x   2) stand and lift one leg straight out to the side so that foot is a few inches of the floor, start with 5x each side and work up to 20x each side   3) stand on one foot, start with 5 seconds each side and work up to 20 seconds on each side  If you need ideas or help with getting more active:  -Silver sneakers https://tools.silversneakers.com  -Walk with a Doc: http://www.duncan-williams.com/  -try to include resistance (weight lifting/strength building) and balance exercises twice per week: or the following link for ideas: http://castillo-powell.com/  BuyDucts.dk  STRESS MANAGEMENT: -can try meditating, or just sitting quietly with deep breathing while intentionally relaxing all parts of your body for 5 minutes daily -if you need further help with stress, anxiety or depression please follow up with your primary doctor or contact the wonderful folks at [facility]: [phone]  SOCIAL CONNECTIONS: -options in [place] if you wish to engage in more social and exercise related activities:  -Silver sneakers https://tools.silversneakers.com  -Walk with a Doc: http://www.duncan-williams.com/  -Check out the [facility] Active Adults 50+  section on the [place] of [employer] (hiking clubs, book clubs, cards and games, chess, exercise classes, aquatic classes and much more) - see the website for details: https://www.[place].gov/departments/parks-recreation/active-adults50  -YouTube has lots of exercise videos for different ages and abilities as well  -[facility] (a variety of indoor and outdoor inperson activities for adults). [phone]. [address]  -Virtual Online Classes (a variety of topics): see seniorplanet.org or call [phone]  -consider volunteering at a school, hospice center, church, senior center or elsewhere          ADVANCED HEALTHCARE DIRECTIVES:  [place] Advanced Directives assistance:   ExpressWeek.com.cy  Everyone should have advanced health care directives in place. This is so that you get the care you want, should you ever be in a situation where you are unable to make your own medical decisions.   From the [place] Advanced Directive Website: Advance Health Care Directives are legal documents in which you give written instructions about your health care if, in the future, you cannot speak for yourself.   A health care power of attorney allows you to name a person you trust to make your health care decisions if you cannot make them yourself. A declaration of a desire for a natural death (or living will) is document, which states that you desire not to have your life prolonged by extraordinary measures if you have a terminal or incurable illness or if you are in a vegetative state. An advance instruction for mental health treatment makes a declaration of instructions, information and preferences regarding your mental health treatment. It also states that you are aware that the advance instruction authorizes a mental health treatment provider to act according to your wishes. It may  also outline your consent or  refusal of mental health treatment. A declaration of an anatomical gift allows anyone over the age of 34 to make a gift by will, organ donor card or other document.   Please see the following website or an elder law attorney for forms, FAQs and for completion of advanced directives: [place]  Print production planner Health Care Directives Advance Health Care Directives (http://guzman.[email or web address]/)  Or copy and paste the following to your web browser: [email or web address]

## 2024-04-25 ENCOUNTER — Ambulatory Visit: Admitting: Sports Medicine

## 2024-04-25 VITALS — HR 72 | Ht 65.0 in | Wt 148.0 lb

## 2024-04-25 DIAGNOSIS — S83411D Sprain of medial collateral ligament of right knee, subsequent encounter: Secondary | ICD-10-CM

## 2024-04-25 DIAGNOSIS — M25561 Pain in right knee: Secondary | ICD-10-CM

## 2024-04-25 DIAGNOSIS — S83521D Sprain of posterior cruciate ligament of right knee, subsequent encounter: Secondary | ICD-10-CM | POA: Diagnosis not present

## 2024-04-25 DIAGNOSIS — G8929 Other chronic pain: Secondary | ICD-10-CM

## 2024-04-25 NOTE — Progress Notes (Signed)
    Janet Brock D.[facility] [address] [place] [address] Phone: [phone]   Assessment and Plan:     1. Chronic pain of right knee 2. 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 Sprain of medial collateral ligament of right knee, subsequent encounter  - Chronic with exacerbation, subsequent visit - Overall significant improvement in knee pain and stability after completing course of physical therapy with history of PCL tear and partial MCL tear as seen on MRI from February 2025.  Patient still has occasional flares of knee discomfort and swelling, but overall has been able to return to physical activities without restriction - Continue HEP at least 3 times a week to promote stability and strength - Continue using Tylenol /NSAIDs daily as needed for pain relief - If patient has persistent flare of knee pain or effusion, could call and ask for prescription NSAID course such as meloxicam versus following up in clinic for CSI   Pertinent previous records reviewed include none  Follow Up: As needed   Subjective:   I, Janet Brock, am serving as a Neurosurgeon for Doctor [employer]  Chief Complaint: R knee pain   HPI:   11/18/2023 Janet Brock is a 68 y.o. female who presents to [facility] today for f/u R knee pain w/ MRI review. Pt was last seen by Dr. Joane on 11/04/23 and her R knee was aspirated and injected and was advised to wear a hinged knee brace. MyChart messages were exchanged about her XR results and a MRI was ordered.   Today, pt reports R knee was hurting yesterday along the medial aspect. Being immobile is nagging her. She reports extensive bruising over the knee and into the lower leg.    Dx imaging: 11/16/23 R knee MRI 06/03/23 R knee XR    Pertinent review of systems: no fever or chills   Relevant historical information: Hypothyroidism.  Patient is  traveling to [place] this weekend.  04/25/24 Patient states her knee is swollen. Wants to know why knee is still swollen.    Relevant Historical Information: Hypothyroidism  Additional pertinent review of systems negative.   Current Outpatient Medications:    ASPIRIN 81 PO, Take 1 tablet by mouth daily. Three x a week, Disp: , Rfl:    levothyroxine  (SYNTHROID ) 100 MCG tablet, Take 1 tablet (100 mcg total) by mouth daily., Disp: 90 tablet, Rfl: 2   Objective:     Vitals:   04/25/24 1432  Pulse: 72  SpO2: 97%  Weight: 148 lb (67.1 kg)  Height: 5' 5 (1.651 m)      Body mass index is 24.63 kg/m.    Physical Exam:    General:  awake, alert oriented, no acute distress nontoxic Skin: no suspicious lesions or rashes Neuro:sensation intact and strength 5/5 with no deficits, no atrophy, normal muscle tone Psych: No signs of anxiety, depression or other mood disorder  Right knee: Mild swelling No deformity Positive fluid wave, joint milking ROM Flex 110, Ext 0 NTTP over the quad tendon, medial fem condyle, lat fem condyle, patella, plica, patella tendon, tibial tuberostiy, fibular head, posterior fossa, pes anserine bursa, gerdy's tubercle, medial jt line, lateral jt line Neg anterior and equivocal posterior drawer Neg lachman Neg sag sign Negative varus stress Negative valgus stress Negative McMurray Gait normal    Electronically signed by:  Odis Mace D.[facility] 2:58 PM 04/25/24

## 2024-04-25 NOTE — Patient Instructions (Signed)
 Tylenol  and NSAIDs as needed   Call and let us  know if you want a prescription strength NSAID called in   As needed follow up

## 2024-05-24 ENCOUNTER — Encounter: Payer: Self-pay | Admitting: Sports Medicine

## 2024-09-14 ENCOUNTER — Ambulatory Visit: Admission: EM | Admit: 2024-09-14 | Discharge: 2024-09-14 | Disposition: A | Discharge status: Home / Self Care

## 2024-09-14 ENCOUNTER — Ambulatory Visit: Payer: Self-pay | Admitting: *Deleted

## 2024-09-14 DIAGNOSIS — S91209A Unspecified open wound of unspecified toe(s) with damage to nail, initial encounter: Secondary | ICD-10-CM

## 2024-09-14 DIAGNOSIS — B351 Tinea unguium: Secondary | ICD-10-CM | POA: Diagnosis not present

## 2024-09-14 NOTE — Discharge Instructions (Addendum)
-   Your toenail is falling off.  It will fall off on its own in time. - Keep it secured with a Band-Aid or Coban. - Clean area with soap and water.  May apply small mayonnaise for around the cuticle.  If you notice increased redness, swelling, pustular drainage please return. - I put a referral into podiatry for you.

## 2024-09-14 NOTE — ED Provider Notes (Signed)
 " [facility]    CSN: [id number] Arrival date & time: 09/14/24  0847      History   Chief Complaint Chief Complaint  Patient presents with   Toe Pain    HPI Janet Brock is a 68 y.o. female presenting for avulsion of right great toenail that she noticed when she was clipping her nails yesterday.  She denies any known injury.  She reports a little bit of pain around the cuticle where the nail started to lift.  No bleeding, drainage, redness or swelling.  The toenail has been thick and yellow.  HPI  Past Medical History:  Diagnosis Date   Anemia    past hx - off Iron   Heart murmur    History of varicella    Hypothyroidism    synthroid   ? hyperthyroid when young and thin and then  dx hypo dx at clinic at work    Migraines    pre menopausal  hx of vicodin  rescue     Patient Active Problem List   Diagnosis Date Noted   Breast lump 05/19/2018   Greater trochanteric bursitis of right hip 11/14/2016   Hypothyroidism     Past Surgical History:  Procedure Laterality Date   ABLATION     ? year    btl     CATARACT EXTRACTION Bilateral    12/06/2021, 12/20/2021   COLONOSCOPY     approx 2 years ago   HYSTEROSCOPY  05/08/2011   Procedure: HYSTEROSCOPY WITH HYDROTHERMAL ABLATION;  Surgeon: Oneil FORBES Piety;  Location: [facility];  Service: Gynecology;  Laterality: N/A;   svd       x 2   WISDOM TOOTH EXTRACTION      OB History     Gravida  3   Para  3   Term  3   Preterm      AB      Living         SAB      IAB      Ectopic      Multiple      Live Births               Home Medications    Prior to Admission medications  Medication Sig Start Date End Date Taking? Authorizing Provider  DENTA 5000 PLUS 1.1 % CREA dental cream Take by mouth 3 (three) times daily. 04/13/24  Yes [provider]  ASPIRIN 81 PO Take 1 tablet by mouth daily. Three x a week    [provider]  levothyroxine  (SYNTHROID ) 100 MCG tablet Take 1  tablet (100 mcg total) by mouth daily. 02/22/24   Panosh, Apolinar POUR, MD    Family History Family History  Problem Relation Age of Onset   Leukemia Mother    Cancer Mother 39       Breast Cancer   Breast cancer Mother    Alcohol abuse Father    Heart disease Father    Dementia Father    Lymphoma Sister    Thyroid  disease Sister    Cancer Maternal Aunt        Breast, Skin   Leukemia Maternal Aunt    Breast cancer Maternal Aunt    Colon cancer Neg Hx    Colon polyps Neg Hx    Esophageal cancer Neg Hx    Rectal cancer Neg Hx    Stomach cancer Neg Hx     Social History Social History[1]  Allergies   Sumatriptan and Clavulanic acid   Review of Systems Review of Systems  Musculoskeletal:  Negative for arthralgias and joint swelling.  Skin:  Negative for color change and wound.       Great toenail problem  Neurological:  Negative for weakness and numbness.     Physical Exam Triage Vital Signs ED Triage Vitals  Encounter Vitals Group     BP 09/14/24 0953 (!) 155/90     Girls Systolic BP Percentile --      Girls Diastolic BP Percentile --      Boys Systolic BP Percentile --      Boys Diastolic BP Percentile --      Pulse Rate 09/14/24 0953 (!) 57     Resp 09/14/24 0953 18     Temp 09/14/24 0953 97.9 F (36.6 C)     Temp Source 09/14/24 0953 Oral     SpO2 09/14/24 0953 97 %     Weight 09/14/24 0952 148 lb (67.1 kg)     Height --      Head Circumference --      Peak Flow --      Pain Score 09/14/24 0951 1     Pain Loc --      Pain Education --      Exclude from Growth Chart --    No data found.  Updated Vital Signs BP (!) 155/90 (BP Location: Left Arm)   Pulse (!) 57   Temp 97.9 F (36.6 C) (Oral)   Resp 18   Wt 148 lb (67.1 kg)   LMP 12/21/2013   SpO2 97%   BMI 24.63 kg/m    Physical Exam Vitals and nursing note reviewed.  Constitutional:      General: She is not in acute distress.    Appearance: Normal appearance. She is not ill-appearing or  toxic-appearing.  HENT:     Head: Normocephalic and atraumatic.  Eyes:     General: No scleral icterus.       Right eye: No discharge.        Left eye: No discharge.     Conjunctiva/sclera: Conjunctivae normal.  Cardiovascular:     Rate and Rhythm: Bradycardia present.     Pulses: Normal pulses.  Pulmonary:     Effort: Pulmonary effort is normal. No respiratory distress.  Musculoskeletal:     Cervical back: Neck supple.  Feet:     Comments: Right great toenail: Avulsion with 50% of nail lifted from nailbed.  No erythema, swelling, bleeding, pustular drainage.  Thick yellow toenail noted.  Mild tenderness. Skin:    General: Skin is dry.  Neurological:     General: No focal deficit present.     Mental Status: She is alert. Mental status is at baseline.     Motor: No weakness.     Gait: Gait normal.  Psychiatric:        Mood and Affect: Mood normal.        Behavior: Behavior normal.      UC Treatments / Results  Labs (all labs ordered are listed, but only abnormal results are displayed) Labs Reviewed - No data to display  EKG   Radiology No results found.  Procedures Procedures (including critical care time)  Medications Ordered in UC Medications - No data to display  Initial Impression / Assessment and Plan / UC Course  I have reviewed the triage vital signs and the nursing notes.  Pertinent labs & imaging results that were  available during my care of the patient were reviewed by me and considered in my medical decision making (see chart for details).   67 year old female presents for avulsion of right great toenail that she noticed yesterday when she was clipping her nail.  No injury.  Patient she has thick yellow toenail consistent with onychomycosis.  About 50% of the great toenail has been lifted.  No erythema swelling, pustular drainage or bleeding.  Nail was secured with Coban.  Discussed practicing good hygiene.  Advised small amount Neosporin along the  cuticle.  Placed a referral to podiatry.  Explained for her not to try to remove the nail herself and to keep it secured. Reviewed returning for any signs of infection. Otherwise, follow-up with podiatry.     Final Clinical Impressions(s) / UC Diagnoses   Final diagnoses:  Avulsion of toenail, initial encounter  Onychomycosis     Discharge Instructions      - Your toenail is falling off.  It will fall off on its own in time. - Keep it secured with a Band-Aid or Coban. - Clean area with soap and water.  May apply small mayonnaise for around the cuticle.  If you notice increased redness, swelling, pustular drainage please return. - I put a referral into podiatry for you.    ED Prescriptions   None    PDMP not reviewed this encounter.    [1]  Social History Tobacco Use   Smoking status: Never   Smokeless tobacco: Never  Vaping Use   Vaping status: Never Used  Substance Use Topics   Alcohol use: Yes    Alcohol/week: 2.0 standard drinks of alcohol    Types: 2 Glasses of wine per week   Drug use: No     Arvis Jolan NOVAK, PA-C 09/14/24 1012  "

## 2024-09-14 NOTE — Telephone Encounter (Signed)
 FYI Only or Action Required?: FYI only for provider: appointment scheduled on 09/19/24.  Patient was last seen in primary care on 03/24/2024 by Luke Chiquita SAUNDERS, DO.  Called Nurse Triage reporting Toe Pain. Toenail coming off  Symptoms began yesterday.  Interventions attempted: Other: bandaid applied.  Symptoms are: gradually worsening.  Triage Disposition: See PCP Within 2 Weeks  Patient/caregiver understands and will follow disposition?: Yes   Please advise if earlier appt can be scheduled per patient request.               Copied from CRM [id number]. Topic: Clinical - Red Word Triage >> Sep 14, 2024  7:38 AM Berneda FALCON wrote: Red Word that prompted transfer to Nurse Triage: Pt states last night she noticed toenail on left big toe is coming off. It is a little swollen and she has no idea what happened. Painful when walking Reason for Disposition  [1] MILD pain (e.g., does not interfere with normal activities) AND [2] present > 7 days  Answer Assessment - Initial Assessment Questions Earliest appt with any provider 09/19/24. Patient requesting earlier appt if possible . Recommended UC if sx worsen. Patient has applied bandaid over toenail to prevent toenail from tearing off.      1. ONSET: When did the pain start?      Last night  2. LOCATION: Where is the pain located?   (e.g., around nail, entire toe, at foot joint)      Left great toe 3. PAIN: How bad is the pain?    (Scale 1-10; or mild, moderate, severe)     Mild 1/10 4. APPEARANCE: What does the toe look like? (e.g., redness, swelling, bruising, pallor)     Swelling  5. CAUSE: What do you think is causing the toe pain?     Toenail lifted off left side of left great toe 6. OTHER SYMPTOMS: Do you have any other symptoms? (e.g., leg pain, rash, fever, numbness)     Left great toe mild pain with walking, toenail lifted from left side of toe. Swelling no redness reported no bleeding no fever 7. PREGNANCY:  Is there any chance you are pregnant? When was your last menstrual period?     na  Protocols used: Toe Pain-A-AH

## 2024-09-14 NOTE — ED Triage Notes (Signed)
 Patient presents to UC for right big toenail problem. States she noted the nail lifting. She is unsure when it started. Secured it down with a band aid.

## 2024-09-19 ENCOUNTER — Ambulatory Visit (INDEPENDENT_AMBULATORY_CARE_PROVIDER_SITE_OTHER): Admitting: Family Medicine

## 2024-09-19 ENCOUNTER — Encounter: Payer: Self-pay | Admitting: Family Medicine

## 2024-09-19 VITALS — BP 132/70 | HR 65 | Temp 98.0°F | Wt 150.5 lb

## 2024-09-19 DIAGNOSIS — Z23 Encounter for immunization: Secondary | ICD-10-CM | POA: Diagnosis not present

## 2024-09-19 DIAGNOSIS — L601 Onycholysis: Secondary | ICD-10-CM | POA: Diagnosis not present

## 2024-09-19 NOTE — Telephone Encounter (Signed)
 Pt was seen today with Dr. Micheal.

## 2024-09-19 NOTE — Patient Instructions (Signed)
 Clean toe daily with soap and water.    Watch for any signs of infection such as redness, swelling, warmth, or foul smelling drainage.   Would not remove nail yet- unless this lifts up/becomes more detached.

## 2024-09-19 NOTE — Progress Notes (Signed)
 "  Established Patient Office Visit  Subjective   Patient ID: Janet Brock, female    DOB: 12/01/1955  Age: 68 y.o. MRN: [id number]  Chief Complaint  Patient presents with   Nail Problem    HPI   Allisen is seen with some looseness of the left great toenail.  Denies any injury.  She first noticed this seemed a little bit loose on 12/23.  Denies any injury.  She does get pedicures but last pedicure was over a month ago.  Minimal if any pain.  No discharge.  No history of diabetes.  She went to urgent care on 12-24 and it was suggested she wear compression wrap with some Coban around the toe to help anchor the nail and podiatry referral was placed.  Past Medical History:  Diagnosis Date   Anemia    past hx - off Iron   Heart murmur    History of varicella    Hypothyroidism    synthroid   ? hyperthyroid when young and thin and then  dx hypo dx at clinic at work    Migraines    pre menopausal  hx of vicodin  rescue    Past Surgical History:  Procedure Laterality Date   ABLATION     ? year    btl     CATARACT EXTRACTION Bilateral    12/06/2021, 12/20/2021   COLONOSCOPY     approx 2 years ago   HYSTEROSCOPY  05/08/2011   Procedure: HYSTEROSCOPY WITH HYDROTHERMAL ABLATION;  Surgeon: Oneil FORBES Piety;  Location: [facility];  Service: Gynecology;  Laterality: N/A;   svd       x 2   WISDOM TOOTH EXTRACTION      reports that she has never smoked. She has never used smokeless tobacco. She reports current alcohol use of about 2.0 standard drinks of alcohol per week. She reports that she does not use drugs. family history includes Alcohol abuse in her father; Breast cancer in her maternal aunt and mother; Cancer in her maternal aunt; Cancer (age of onset: 16) in her mother; Dementia in her father; Heart disease in her father; Leukemia in her maternal aunt and mother; Lymphoma in her sister; Thyroid  disease in her sister. Allergies[1]  Review of Systems  Constitutional:  Negative for chills  and fever.      Objective:     BP 132/70   Pulse 65   Temp 98 F (36.7 C) (Oral)   Wt 150 lb 8 oz (68.3 kg)   LMP 12/21/2013   SpO2 97%   BMI 25.04 kg/m  BP Readings from Last 3 Encounters:  09/19/24 132/70  09/14/24 (!) 155/90  11/18/23 132/76   Wt Readings from Last 3 Encounters:  09/19/24 150 lb 8 oz (68.3 kg)  09/14/24 148 lb (67.1 kg)  04/25/24 148 lb (67.1 kg)      Physical Exam Vitals reviewed.  Constitutional:      General: She is not in acute distress.    Appearance: She is not ill-appearing.  Cardiovascular:     Rate and Rhythm: Normal rate and regular rhythm.  Skin:    Comments: Left great toe reveals no erythema.  No warmth.  Toenail is not fully attached distally.  She appears to have some onycholysis.  No purulent or foul-smelling drainage.  Nail appears to be fairly firmly anchored at the base  Neurological:     Mental Status: She is alert.      No results found for any  visits on 09/19/24.    The ASCVD Risk score (Arnett DK, et al., 2019) failed to calculate for the following reasons:   Cannot find a previous HDL lab   Cannot find a previous total cholesterol lab   * - Cholesterol units were assumed    Assessment & Plan:   Onycholysis left great toenail.  No signs of secondary infection.  Currently appears to be fairly anchored at the base.  Light Coban wrap was reapplied.  Try to keep toes dry as possible.  She has pending follow-up with podiatry but see no imminent reason for nail excision at this time  Patient also requesting flu vaccine today and this was given   No follow-ups on file.    Wolm Scarlet, MD     [1]  Allergies Allergen Reactions   Sumatriptan Shortness Of Breath and Other (See Comments)   Clavulanic Acid Nausea And Vomiting    Amoxicillin is ok not augmentin   "
